# Patient Record
Sex: Female | Born: 1960 | Race: White | Hispanic: No | Marital: Single | Smoking: Current every day smoker
Health system: Southern US, Community
[De-identification: ages and names within clinical notes are randomized; demographics above are authoritative.]

## PROBLEM LIST (undated history)

## (undated) DIAGNOSIS — J45909 Unspecified asthma, uncomplicated: Secondary | ICD-10-CM

## (undated) DIAGNOSIS — I639 Cerebral infarction, unspecified: Secondary | ICD-10-CM

## (undated) DIAGNOSIS — F419 Anxiety disorder, unspecified: Secondary | ICD-10-CM

## (undated) DIAGNOSIS — G252 Other specified forms of tremor: Secondary | ICD-10-CM

## (undated) DIAGNOSIS — E119 Type 2 diabetes mellitus without complications: Secondary | ICD-10-CM

## (undated) DIAGNOSIS — J449 Chronic obstructive pulmonary disease, unspecified: Secondary | ICD-10-CM

## (undated) DIAGNOSIS — I10 Essential (primary) hypertension: Secondary | ICD-10-CM

## (undated) DIAGNOSIS — E785 Hyperlipidemia, unspecified: Secondary | ICD-10-CM

## (undated) DIAGNOSIS — G4700 Insomnia, unspecified: Secondary | ICD-10-CM

## (undated) HISTORY — PX: FOOT SURGERY: SHX648

## (undated) HISTORY — PX: BRONCHOSCOPY: SUR163

## (undated) HISTORY — PX: CERVICAL SPINE SURGERY: SHX589

## (undated) HISTORY — PX: BACK SURGERY: SHX140

## (undated) HISTORY — PX: CHOLECYSTECTOMY: SHX55

---

## 2004-10-01 ENCOUNTER — Emergency Department: Payer: Self-pay | Admitting: Internal Medicine

## 2005-08-31 ENCOUNTER — Emergency Department: Payer: Self-pay | Admitting: Unknown Physician Specialty

## 2006-10-09 ENCOUNTER — Ambulatory Visit: Payer: Self-pay

## 2007-04-12 ENCOUNTER — Emergency Department: Payer: Self-pay | Admitting: Emergency Medicine

## 2007-06-16 ENCOUNTER — Emergency Department (HOSPITAL_COMMUNITY): Admission: EM | Admit: 2007-06-16 | Discharge: 2007-06-17 | Payer: Self-pay | Admitting: Emergency Medicine

## 2008-06-24 ENCOUNTER — Emergency Department: Payer: Self-pay | Admitting: Emergency Medicine

## 2008-08-30 ENCOUNTER — Emergency Department: Payer: Self-pay | Admitting: Unknown Physician Specialty

## 2008-10-21 ENCOUNTER — Emergency Department (HOSPITAL_COMMUNITY): Admission: EM | Admit: 2008-10-21 | Discharge: 2008-10-21 | Payer: Self-pay | Admitting: Emergency Medicine

## 2008-11-02 ENCOUNTER — Ambulatory Visit: Payer: Self-pay | Admitting: Pain Medicine

## 2009-05-27 ENCOUNTER — Ambulatory Visit: Payer: Self-pay | Admitting: Orthopedic Surgery

## 2009-06-07 ENCOUNTER — Ambulatory Visit: Payer: Self-pay | Admitting: Unknown Physician Specialty

## 2009-06-14 ENCOUNTER — Ambulatory Visit: Payer: Self-pay | Admitting: Unknown Physician Specialty

## 2009-11-04 ENCOUNTER — Ambulatory Visit: Payer: Self-pay | Admitting: Unknown Physician Specialty

## 2009-11-21 ENCOUNTER — Ambulatory Visit: Payer: Self-pay | Admitting: Family Medicine

## 2009-11-22 ENCOUNTER — Ambulatory Visit: Payer: Self-pay | Admitting: Unknown Physician Specialty

## 2009-11-23 ENCOUNTER — Emergency Department: Payer: Self-pay | Admitting: Emergency Medicine

## 2009-12-08 ENCOUNTER — Emergency Department: Payer: Self-pay | Admitting: Unknown Physician Specialty

## 2010-01-08 ENCOUNTER — Inpatient Hospital Stay: Payer: Self-pay | Admitting: Unknown Physician Specialty

## 2010-02-14 ENCOUNTER — Emergency Department: Payer: Self-pay | Admitting: Emergency Medicine

## 2010-04-10 ENCOUNTER — Encounter: Payer: Self-pay | Admitting: Unknown Physician Specialty

## 2010-04-26 ENCOUNTER — Emergency Department: Payer: Self-pay | Admitting: Emergency Medicine

## 2010-10-31 ENCOUNTER — Ambulatory Visit: Payer: Self-pay

## 2010-11-14 ENCOUNTER — Ambulatory Visit: Payer: Self-pay | Admitting: Unknown Physician Specialty

## 2011-09-22 ENCOUNTER — Emergency Department: Payer: Self-pay | Admitting: Emergency Medicine

## 2011-11-18 ENCOUNTER — Ambulatory Visit: Payer: Self-pay | Admitting: Internal Medicine

## 2011-11-23 ENCOUNTER — Inpatient Hospital Stay: Payer: Self-pay | Admitting: Specialist

## 2011-11-23 LAB — COMPREHENSIVE METABOLIC PANEL
Albumin: 3.1 g/dL — ABNORMAL LOW (ref 3.4–5.0)
Anion Gap: 13 (ref 7–16)
Anion Gap: 5 — ABNORMAL LOW (ref 7–16)
BUN: 18 mg/dL (ref 7–18)
Calcium, Total: 8.8 mg/dL (ref 8.5–10.1)
Calcium, Total: 8.3 mg/dL — ABNORMAL LOW (ref 8.5–10.1)
Chloride: 97 mmol/L — ABNORMAL LOW (ref 98–107)
Chloride: 110 mmol/L — ABNORMAL HIGH (ref 98–107)
Co2: 31 mmol/L (ref 21–32)
EGFR (African American): 44 — ABNORMAL LOW
EGFR (African American): >60
EGFR (Non-African Amer.): >60
Glucose: 722 mg/dL (ref 65–99)
Osmolality: 306 (ref 275–301)
Potassium: 6.2 mmol/L — ABNORMAL HIGH (ref 3.5–5.1)
Potassium: 5 mmol/L (ref 3.5–5.1)
SGOT(AST): 70 U/L — ABNORMAL HIGH (ref 15–37)
SGOT(AST): 55 U/L — ABNORMAL HIGH (ref 15–37)
SGPT (ALT): 46 U/L (ref 12–78)
Sodium: 135 mmol/L — ABNORMAL LOW (ref 136–145)
Total Protein: 7.5 g/dL (ref 6.4–8.2)
Total Protein: 7.3 g/dL (ref 6.4–8.2)

## 2011-11-23 LAB — CBC
HCT: 40 % (ref 35.0–47.0)
MCHC: 31.2 g/dL — ABNORMAL LOW (ref 32.0–36.0)
MCV: 97 fL (ref 80–100)
RDW: 14.1 % (ref 11.5–14.5)
WBC: 11.1 10*3/uL — ABNORMAL HIGH (ref 3.6–11.0)

## 2011-11-23 LAB — ETHANOL
Ethanol %: <0.003 % (ref 0.000–0.080)
Ethanol: <3 mg/dL

## 2011-11-23 LAB — URINALYSIS, COMPLETE
Glucose,UR: >=500 mg/dL (ref 0–75)
Hyaline Cast: 4
Ketone: NEGATIVE
Specific Gravity: 1.027 (ref 1.003–1.030)
Squamous Epithelial: NONE SEEN
WBC UR: 1 /HPF (ref 0–5)

## 2011-11-23 LAB — PHOSPHORUS: Phosphorus: 10.3 mg/dL — ABNORMAL HIGH (ref 2.5–4.9)

## 2011-11-23 LAB — DRUG SCREEN, URINE
Cannabinoid 50 Ng, Ur ~~LOC~~: NEGATIVE (ref ?–50)
Cocaine Metabolite,Ur ~~LOC~~: NEGATIVE (ref ?–300)
MDMA (Ecstasy)Ur Screen: NEGATIVE (ref ?–500)
Opiate, Ur Screen: NEGATIVE (ref ?–300)
Phencyclidine (PCP) Ur S: NEGATIVE (ref ?–25)
Tricyclic, Ur Screen: NEGATIVE (ref ?–1000)

## 2011-11-23 LAB — AMMONIA: Ammonia, Plasma: 39 mcmol/L — ABNORMAL HIGH (ref 11–32)

## 2011-11-23 LAB — SALICYLATE LEVEL: Salicylates, Serum: 3.5 mg/dL — ABNORMAL HIGH

## 2011-11-23 LAB — PROTIME-INR
INR: 0.9
Prothrombin Time: 13 secs (ref 11.5–14.7)

## 2011-11-23 LAB — CK TOTAL AND CKMB (NOT AT ARMC): CK, Total: 62 U/L (ref 21–215)

## 2011-11-23 LAB — MAGNESIUM: Magnesium: 2.3 mg/dL

## 2011-11-23 LAB — ACETAMINOPHEN LEVEL: Acetaminophen: <2 ug/mL

## 2011-11-23 LAB — TSH: Thyroid Stimulating Horm: 1.86 u[IU]/mL

## 2011-11-24 LAB — URIC ACID: Uric Acid: 5.2 mg/dL (ref 2.6–6.0)

## 2011-11-24 LAB — COMPREHENSIVE METABOLIC PANEL
Albumin: 2.7 g/dL — ABNORMAL LOW (ref 3.4–5.0)
Anion Gap: 3 — ABNORMAL LOW (ref 7–16)
BUN: 18 mg/dL (ref 7–18)
Bilirubin,Total: 0.2 mg/dL (ref 0.2–1.0)
Chloride: 113 mmol/L — ABNORMAL HIGH (ref 98–107)
EGFR (African American): >60
Glucose: 202 mg/dL — ABNORMAL HIGH (ref 65–99)
Osmolality: 300 (ref 275–301)
Potassium: 4.4 mmol/L (ref 3.5–5.1)
SGOT(AST): 46 U/L — ABNORMAL HIGH (ref 15–37)
Sodium: 147 mmol/L — ABNORMAL HIGH (ref 136–145)
Total Protein: 7 g/dL (ref 6.4–8.2)

## 2011-11-24 LAB — CBC WITH DIFFERENTIAL/PLATELET
Basophil #: 0 10*3/uL (ref 0.0–0.1)
Basophil %: 0.3 %
Eosinophil %: 0.3 %
Lymphocyte #: 1.7 10*3/uL (ref 1.0–3.6)
MCH: 31.7 pg (ref 26.0–34.0)
Monocyte #: 1.1 x10 3/mm — ABNORMAL HIGH (ref 0.2–0.9)
Neutrophil %: 78.2 %
Platelet: 199 10*3/uL (ref 150–440)
RDW: 13.9 % (ref 11.5–14.5)

## 2011-11-24 LAB — PHOSPHORUS: Phosphorus: 3.7 mg/dL (ref 2.5–4.9)

## 2011-11-25 LAB — CBC WITH DIFFERENTIAL/PLATELET
Basophil #: 0 10*3/uL (ref 0.0–0.1)
Eosinophil #: 0.3 10*3/uL (ref 0.0–0.7)
Eosinophil %: 2.6 %
HCT: 33.6 % — ABNORMAL LOW (ref 35.0–47.0)
MCH: 30.3 pg (ref 26.0–34.0)
MCHC: 32.9 g/dL (ref 32.0–36.0)
Monocyte #: 0.9 x10 3/mm (ref 0.2–0.9)
Neutrophil %: 66.5 %
Platelet: 208 10*3/uL (ref 150–440)
RDW: 13.9 % (ref 11.5–14.5)
WBC: 10.7 10*3/uL (ref 3.6–11.0)

## 2011-11-25 LAB — MAGNESIUM: Magnesium: 1.7 mg/dL — ABNORMAL LOW

## 2011-11-25 LAB — SODIUM: Sodium: 141 mmol/L (ref 136–145)

## 2011-11-26 LAB — BASIC METABOLIC PANEL
Anion Gap: 8 (ref 7–16)
Chloride: 105 mmol/L (ref 98–107)
Co2: 30 mmol/L (ref 21–32)
Creatinine: 0.51 mg/dL — ABNORMAL LOW (ref 0.60–1.30)

## 2011-11-26 LAB — CBC WITH DIFFERENTIAL/PLATELET
Basophil #: 0 10*3/uL (ref 0.0–0.1)
HCT: 32.1 % — ABNORMAL LOW (ref 35.0–47.0)
HGB: 11 g/dL — ABNORMAL LOW (ref 12.0–16.0)
Lymphocyte #: 1.9 10*3/uL (ref 1.0–3.6)
MCH: 31 pg (ref 26.0–34.0)
MCHC: 34.1 g/dL (ref 32.0–36.0)
Monocyte #: 0.7 x10 3/mm (ref 0.2–0.9)
Neutrophil #: 7.8 10*3/uL — ABNORMAL HIGH (ref 1.4–6.5)
Platelet: 211 10*3/uL (ref 150–440)
RDW: 13.4 % (ref 11.5–14.5)

## 2011-11-28 LAB — VANCOMYCIN, TROUGH
Vancomycin, Trough: 31 ug/mL (ref 10–20)
Vancomycin, Trough: 24 ug/mL (ref 10–20)

## 2011-11-28 LAB — CREATININE, SERUM
Creatinine: 1.08 mg/dL (ref 0.60–1.30)
EGFR (African American): >60
EGFR (African American): 52 — ABNORMAL LOW
EGFR (Non-African Amer.): 45 — ABNORMAL LOW

## 2011-11-29 LAB — CBC WITH DIFFERENTIAL/PLATELET
Basophil #: 0.1 10*3/uL (ref 0.0–0.1)
Basophil %: 0.7 %
Eosinophil #: 0 10*3/uL (ref 0.0–0.7)
Eosinophil %: 0.1 %
HCT: 29.5 % — ABNORMAL LOW (ref 35.0–47.0)
HGB: 9.4 g/dL — ABNORMAL LOW (ref 12.0–16.0)
Lymphocyte #: 1.8 10*3/uL (ref 1.0–3.6)
Lymphocyte %: 14.2 %
MCH: 29.2 pg (ref 26.0–34.0)
MCHC: 31.9 g/dL — ABNORMAL LOW (ref 32.0–36.0)
MCV: 92 fL (ref 80–100)
Monocyte #: 0.9 x10 3/mm (ref 0.2–0.9)
Monocyte %: 7.2 %
Neutrophil #: 10 10*3/uL — ABNORMAL HIGH (ref 1.4–6.5)
Neutrophil %: 77.8 %
Platelet: 229 10*3/uL (ref 150–440)
RBC: 3.23 10*6/uL — ABNORMAL LOW (ref 3.80–5.20)
RDW: 13.6 % (ref 11.5–14.5)
WBC: 12.9 10*3/uL — ABNORMAL HIGH (ref 3.6–11.0)

## 2011-11-29 LAB — BASIC METABOLIC PANEL
Creatinine: 1.85 mg/dL — ABNORMAL HIGH (ref 0.60–1.30)
EGFR (African American): 36 — ABNORMAL LOW
EGFR (Non-African Amer.): 31 — ABNORMAL LOW
Glucose: 433 mg/dL — ABNORMAL HIGH (ref 65–99)
Sodium: 137 mmol/L (ref 136–145)

## 2011-11-29 LAB — WOUND CULTURE

## 2011-11-29 LAB — HEMOGLOBIN: HGB: 9.6 g/dL — ABNORMAL LOW (ref 12.0–16.0)

## 2011-11-30 LAB — CBC WITH DIFFERENTIAL/PLATELET
Basophil %: 0.1 %
Eosinophil %: 0 %
HGB: 9.3 g/dL — ABNORMAL LOW (ref 12.0–16.0)
Lymphocyte %: 9.4 %
MCH: 29.3 pg (ref 26.0–34.0)
Monocyte #: 1 x10 3/mm — ABNORMAL HIGH (ref 0.2–0.9)
Monocyte %: 7.8 %
Neutrophil %: 82.7 %
Platelet: 247 10*3/uL (ref 150–440)
RBC: 3.18 10*6/uL — ABNORMAL LOW (ref 3.80–5.20)
WBC: 12.9 10*3/uL — ABNORMAL HIGH (ref 3.6–11.0)

## 2011-11-30 LAB — BASIC METABOLIC PANEL
Anion Gap: 9 (ref 7–16)
BUN: 36 mg/dL — ABNORMAL HIGH (ref 7–18)
Calcium, Total: 9 mg/dL (ref 8.5–10.1)
EGFR (African American): 37 — ABNORMAL LOW
EGFR (Non-African Amer.): 32 — ABNORMAL LOW
Glucose: 457 mg/dL — ABNORMAL HIGH (ref 65–99)
Osmolality: 302 (ref 275–301)

## 2011-11-30 LAB — GLUCOSE, RANDOM: Glucose: 438 mg/dL — ABNORMAL HIGH (ref 65–99)

## 2011-12-01 LAB — GLUCOSE, RANDOM: Glucose: 421 mg/dL — ABNORMAL HIGH (ref 65–99)

## 2011-12-01 LAB — VANCOMYCIN, TROUGH: Vancomycin, Trough: 16 ug/mL (ref 10–20)

## 2011-12-02 LAB — CBC WITH DIFFERENTIAL/PLATELET
Basophil %: 0.4 %
Eosinophil #: 0.1 10*3/uL (ref 0.0–0.7)
HCT: 29.7 % — ABNORMAL LOW (ref 35.0–47.0)
HGB: 10 g/dL — ABNORMAL LOW (ref 12.0–16.0)
MCH: 30.7 pg (ref 26.0–34.0)
MCHC: 33.7 g/dL (ref 32.0–36.0)
Monocyte #: 1.1 x10 3/mm — ABNORMAL HIGH (ref 0.2–0.9)
Neutrophil #: 9.9 10*3/uL — ABNORMAL HIGH (ref 1.4–6.5)
Neutrophil %: 72.6 %

## 2011-12-02 LAB — BASIC METABOLIC PANEL
Anion Gap: 10 (ref 7–16)
BUN: 30 mg/dL — ABNORMAL HIGH (ref 7–18)
Creatinine: 1.44 mg/dL — ABNORMAL HIGH (ref 0.60–1.30)
EGFR (African American): 48 — ABNORMAL LOW
EGFR (Non-African Amer.): 42 — ABNORMAL LOW

## 2011-12-02 LAB — WOUND CULTURE

## 2012-01-03 ENCOUNTER — Emergency Department: Payer: Self-pay | Admitting: Emergency Medicine

## 2012-01-03 LAB — CBC
MCH: 30.4 pg (ref 26.0–34.0)
MCHC: 34 g/dL (ref 32.0–36.0)
Platelet: 222 10*3/uL (ref 150–440)
RDW: 14.8 % — ABNORMAL HIGH (ref 11.5–14.5)

## 2012-01-03 LAB — COMPREHENSIVE METABOLIC PANEL
Albumin: 3.6 g/dL (ref 3.4–5.0)
BUN: 18 mg/dL (ref 7–18)
Bilirubin,Total: 0.4 mg/dL (ref 0.2–1.0)
Calcium, Total: 9.1 mg/dL (ref 8.5–10.1)
Co2: 25 mmol/L (ref 21–32)
Creatinine: 0.99 mg/dL (ref 0.60–1.30)
EGFR (African American): >60
EGFR (Non-African Amer.): >60
Glucose: 240 mg/dL — ABNORMAL HIGH (ref 65–99)
Osmolality: 287 (ref 275–301)
Potassium: 3.9 mmol/L (ref 3.5–5.1)
SGOT(AST): 16 U/L (ref 15–37)
SGPT (ALT): 16 U/L (ref 12–78)
Sodium: 139 mmol/L (ref 136–145)

## 2012-03-12 ENCOUNTER — Ambulatory Visit: Payer: Self-pay | Admitting: Internal Medicine

## 2012-05-01 ENCOUNTER — Emergency Department: Payer: Self-pay | Admitting: Emergency Medicine

## 2012-05-01 LAB — COMPREHENSIVE METABOLIC PANEL
Albumin: 3.5 g/dL (ref 3.4–5.0)
Alkaline Phosphatase: 126 U/L (ref 50–136)
Anion Gap: 11 (ref 7–16)
Bilirubin,Total: 0.4 mg/dL (ref 0.2–1.0)
Calcium, Total: 9 mg/dL (ref 8.5–10.1)
EGFR (African American): >60
EGFR (Non-African Amer.): >60
Glucose: 488 mg/dL — ABNORMAL HIGH (ref 65–99)
Osmolality: 283 (ref 275–301)
Potassium: 4.5 mmol/L (ref 3.5–5.1)
SGOT(AST): 15 U/L (ref 15–37)
SGPT (ALT): 19 U/L (ref 12–78)
Total Protein: 7.8 g/dL (ref 6.4–8.2)

## 2012-05-01 LAB — CBC
HGB: 15.9 g/dL (ref 12.0–16.0)
MCH: 30.3 pg (ref 26.0–34.0)
MCV: 90 fL (ref 80–100)
RBC: 5.24 10*6/uL — ABNORMAL HIGH (ref 3.80–5.20)
WBC: 15.3 10*3/uL — ABNORMAL HIGH (ref 3.6–11.0)

## 2012-07-21 ENCOUNTER — Ambulatory Visit: Payer: Self-pay | Admitting: Internal Medicine

## 2012-07-30 ENCOUNTER — Observation Stay: Payer: Self-pay | Admitting: Internal Medicine

## 2012-07-30 LAB — BASIC METABOLIC PANEL
Calcium, Total: 8.9 mg/dL (ref 8.5–10.1)
Creatinine: 1.04 mg/dL (ref 0.60–1.30)
EGFR (African American): >60
Glucose: 200 mg/dL — ABNORMAL HIGH (ref 65–99)
Osmolality: 288 (ref 275–301)
Potassium: 3.9 mmol/L (ref 3.5–5.1)
Sodium: 138 mmol/L (ref 136–145)

## 2012-07-30 LAB — CBC
HCT: 41.8 % (ref 35.0–47.0)
HGB: 14.1 g/dL (ref 12.0–16.0)
RBC: 4.58 10*6/uL (ref 3.80–5.20)
RDW: 14.8 % — ABNORMAL HIGH (ref 11.5–14.5)
WBC: 11.8 10*3/uL — ABNORMAL HIGH (ref 3.6–11.0)

## 2012-07-30 LAB — TROPONIN I: Troponin-I: <0.02 ng/mL

## 2012-07-31 LAB — URINALYSIS, COMPLETE
Blood: NEGATIVE
Ketone: NEGATIVE
Nitrite: NEGATIVE
Specific Gravity: 1.021 (ref 1.003–1.030)
WBC UR: <1 /HPF (ref 0–5)

## 2012-07-31 LAB — CBC WITH DIFFERENTIAL/PLATELET
Basophil %: 0.6 %
Eosinophil #: 0 10*3/uL (ref 0.0–0.7)
Eosinophil %: 0.1 %
HCT: 42.4 % (ref 35.0–47.0)
HGB: 14 g/dL (ref 12.0–16.0)
Lymphocyte #: 1.5 10*3/uL (ref 1.0–3.6)
Lymphocyte %: 14.3 %
MCH: 30.4 pg (ref 26.0–34.0)
MCHC: 32.9 g/dL (ref 32.0–36.0)
MCV: 93 fL (ref 80–100)
Monocyte #: 0.1 x10 3/mm — ABNORMAL LOW (ref 0.2–0.9)
Monocyte %: 0.9 %
Neutrophil %: 84.1 %
RDW: 14.7 % — ABNORMAL HIGH (ref 11.5–14.5)

## 2012-07-31 LAB — BASIC METABOLIC PANEL
Anion Gap: 7 (ref 7–16)
BUN: 28 mg/dL — ABNORMAL HIGH (ref 7–18)
Chloride: 105 mmol/L (ref 98–107)
Creatinine: 1.01 mg/dL (ref 0.60–1.30)
EGFR (Non-African Amer.): >60

## 2012-07-31 LAB — CK TOTAL AND CKMB (NOT AT ARMC): CK-MB: 0.8 ng/mL (ref 0.5–3.6)

## 2012-09-08 ENCOUNTER — Ambulatory Visit: Payer: Self-pay | Admitting: Internal Medicine

## 2013-05-21 ENCOUNTER — Emergency Department: Payer: Self-pay | Admitting: Internal Medicine

## 2013-05-21 LAB — BASIC METABOLIC PANEL
ANION GAP: 6 — AB (ref 7–16)
BUN: 12 mg/dL (ref 7–18)
CO2: 29 mmol/L (ref 21–32)
CREATININE: 0.8 mg/dL (ref 0.60–1.30)
Calcium, Total: 9 mg/dL (ref 8.5–10.1)
Chloride: 104 mmol/L (ref 98–107)
EGFR (Non-African Amer.): >60
Glucose: 234 mg/dL — ABNORMAL HIGH (ref 65–99)
Osmolality: 285 (ref 275–301)
POTASSIUM: 4 mmol/L (ref 3.5–5.1)
Sodium: 139 mmol/L (ref 136–145)

## 2013-05-21 LAB — CBC WITH DIFFERENTIAL/PLATELET
BASOS ABS: 0.1 10*3/uL (ref 0.0–0.1)
BASOS PCT: 0.9 %
EOS ABS: 0.3 10*3/uL (ref 0.0–0.7)
EOS PCT: 2.6 %
HCT: 44.5 % (ref 35.0–47.0)
HGB: 14.2 g/dL (ref 12.0–16.0)
Lymphocyte #: 2.3 10*3/uL (ref 1.0–3.6)
Lymphocyte %: 19.3 %
MCH: 29.3 pg (ref 26.0–34.0)
MCHC: 32 g/dL (ref 32.0–36.0)
MCV: 92 fL (ref 80–100)
MONO ABS: 0.5 x10 3/mm (ref 0.2–0.9)
Monocyte %: 4.6 %
NEUTROS ABS: 8.6 10*3/uL — AB (ref 1.4–6.5)
NEUTROS PCT: 72.6 %
PLATELETS: 190 10*3/uL (ref 150–440)
RBC: 4.85 10*6/uL (ref 3.80–5.20)
RDW: 14.1 % (ref 11.5–14.5)
WBC: 11.8 10*3/uL — ABNORMAL HIGH (ref 3.6–11.0)

## 2013-05-21 LAB — TROPONIN I

## 2013-09-07 ENCOUNTER — Ambulatory Visit: Payer: Self-pay | Admitting: Internal Medicine

## 2013-09-10 ENCOUNTER — Inpatient Hospital Stay: Payer: Self-pay | Admitting: Internal Medicine

## 2013-09-10 LAB — CBC
HCT: 44.5 % (ref 35.0–47.0)
HGB: 14.4 g/dL (ref 12.0–16.0)
MCH: 29.9 pg (ref 26.0–34.0)
MCHC: 32.5 g/dL (ref 32.0–36.0)
MCV: 92 fL (ref 80–100)
Platelet: 223 10*3/uL (ref 150–440)
RBC: 4.83 10*6/uL (ref 3.80–5.20)
RDW: 14.3 % (ref 11.5–14.5)
WBC: 9.7 10*3/uL (ref 3.6–11.0)

## 2013-09-10 LAB — BASIC METABOLIC PANEL
Anion Gap: 10 (ref 7–16)
BUN: 19 mg/dL — ABNORMAL HIGH (ref 7–18)
CO2: 26 mmol/L (ref 21–32)
CREATININE: 1.2 mg/dL (ref 0.60–1.30)
Calcium, Total: 8.6 mg/dL (ref 8.5–10.1)
Chloride: 97 mmol/L — ABNORMAL LOW (ref 98–107)
EGFR (African American): 60 — ABNORMAL LOW
EGFR (Non-African Amer.): 52 — ABNORMAL LOW
GLUCOSE: 378 mg/dL — AB (ref 65–99)
Osmolality: 284 (ref 275–301)
Potassium: 4.4 mmol/L (ref 3.5–5.1)
SODIUM: 133 mmol/L — AB (ref 136–145)

## 2013-09-10 LAB — TROPONIN I: Troponin-I: <0.02 ng/mL

## 2013-09-11 LAB — BASIC METABOLIC PANEL
Anion Gap: 10 (ref 7–16)
BUN: 32 mg/dL — ABNORMAL HIGH (ref 7–18)
CHLORIDE: 95 mmol/L — AB (ref 98–107)
CO2: 27 mmol/L (ref 21–32)
CREATININE: 1.53 mg/dL — AB (ref 0.60–1.30)
Calcium, Total: 8.3 mg/dL — ABNORMAL LOW (ref 8.5–10.1)
GFR CALC AF AMER: 45 — AB
GFR CALC NON AF AMER: 38 — AB
Glucose: 695 mg/dL (ref 65–99)
Osmolality: 305 (ref 275–301)
Potassium: 4.5 mmol/L (ref 3.5–5.1)
Sodium: 132 mmol/L — ABNORMAL LOW (ref 136–145)

## 2013-09-11 LAB — CBC WITH DIFFERENTIAL/PLATELET
BASOS PCT: 0.5 %
Basophil #: 0 10*3/uL (ref 0.0–0.1)
EOS PCT: 0.1 %
Eosinophil #: 0 10*3/uL (ref 0.0–0.7)
HCT: 41.4 % (ref 35.0–47.0)
HGB: 13.5 g/dL (ref 12.0–16.0)
Lymphocyte #: 1.1 10*3/uL (ref 1.0–3.6)
Lymphocyte %: 10.8 %
MCH: 30.7 pg (ref 26.0–34.0)
MCHC: 32.5 g/dL (ref 32.0–36.0)
MCV: 94 fL (ref 80–100)
MONO ABS: 0.2 x10 3/mm (ref 0.2–0.9)
Monocyte %: 1.9 %
Neutrophil #: 8.6 10*3/uL — ABNORMAL HIGH (ref 1.4–6.5)
Neutrophil %: 86.7 %
PLATELETS: 186 10*3/uL (ref 150–440)
RBC: 4.38 10*6/uL (ref 3.80–5.20)
RDW: 14.3 % (ref 11.5–14.5)
WBC: 9.9 10*3/uL (ref 3.6–11.0)

## 2013-09-11 LAB — TSH: THYROID STIMULATING HORM: 0.283 u[IU]/mL — AB

## 2013-09-11 LAB — HEMOGLOBIN A1C: Hemoglobin A1C: 11.5 % — ABNORMAL HIGH (ref 4.2–6.3)

## 2013-09-11 LAB — MAGNESIUM: MAGNESIUM: 1.5 mg/dL — AB

## 2013-09-12 LAB — CREATININE, SERUM
CREATININE: 0.93 mg/dL (ref 0.60–1.30)
EGFR (Non-African Amer.): >60

## 2013-09-23 ENCOUNTER — Inpatient Hospital Stay: Payer: Self-pay | Admitting: Internal Medicine

## 2013-09-23 LAB — BASIC METABOLIC PANEL
ANION GAP: 9 (ref 7–16)
BUN: 17 mg/dL (ref 7–18)
CALCIUM: 9.5 mg/dL (ref 8.5–10.1)
CHLORIDE: 92 mmol/L — AB (ref 98–107)
CO2: 28 mmol/L (ref 21–32)
CREATININE: 1.07 mg/dL (ref 0.60–1.30)
GFR CALC NON AF AMER: 59 — AB
GLUCOSE: 555 mg/dL — AB (ref 65–99)
OSMOLALITY: 286 (ref 275–301)
POTASSIUM: 4.3 mmol/L (ref 3.5–5.1)
Sodium: 129 mmol/L — ABNORMAL LOW (ref 136–145)

## 2013-09-23 LAB — CBC
HCT: 42.2 % (ref 35.0–47.0)
HGB: 14.1 g/dL (ref 12.0–16.0)
MCH: 31 pg (ref 26.0–34.0)
MCHC: 33.4 g/dL (ref 32.0–36.0)
MCV: 93 fL (ref 80–100)
Platelet: 165 10*3/uL (ref 150–440)
RBC: 4.55 10*6/uL (ref 3.80–5.20)
RDW: 14.5 % (ref 11.5–14.5)
WBC: 10.5 10*3/uL (ref 3.6–11.0)

## 2013-09-23 LAB — TROPONIN I

## 2013-09-25 LAB — BASIC METABOLIC PANEL
Anion Gap: 10 (ref 7–16)
BUN: 26 mg/dL — ABNORMAL HIGH (ref 7–18)
CO2: 28 mmol/L (ref 21–32)
CREATININE: 0.96 mg/dL (ref 0.60–1.30)
Calcium, Total: 8.7 mg/dL (ref 8.5–10.1)
Chloride: 99 mmol/L (ref 98–107)
EGFR (African American): >60
EGFR (Non-African Amer.): >60
GLUCOSE: 378 mg/dL — AB (ref 65–99)
OSMOLALITY: 294 (ref 275–301)
POTASSIUM: 4 mmol/L (ref 3.5–5.1)
Sodium: 137 mmol/L (ref 136–145)

## 2013-09-25 LAB — CBC WITH DIFFERENTIAL/PLATELET
BASOS PCT: 0.1 %
Basophil #: 0 10*3/uL (ref 0.0–0.1)
EOS ABS: 0 10*3/uL (ref 0.0–0.7)
EOS PCT: 0 %
HCT: 37.9 % (ref 35.0–47.0)
HGB: 12.6 g/dL (ref 12.0–16.0)
LYMPHS ABS: 1 10*3/uL (ref 1.0–3.6)
Lymphocyte %: 10.8 %
MCH: 30.5 pg (ref 26.0–34.0)
MCHC: 33.3 g/dL (ref 32.0–36.0)
MCV: 92 fL (ref 80–100)
MONO ABS: 0.2 x10 3/mm (ref 0.2–0.9)
Monocyte %: 2.5 %
NEUTROS ABS: 8.2 10*3/uL — AB (ref 1.4–6.5)
Neutrophil %: 86.6 %
PLATELETS: 177 10*3/uL (ref 150–440)
RBC: 4.14 10*6/uL (ref 3.80–5.20)
RDW: 14.7 % — ABNORMAL HIGH (ref 11.5–14.5)
WBC: 9.5 10*3/uL (ref 3.6–11.0)

## 2013-10-03 ENCOUNTER — Ambulatory Visit: Payer: Self-pay

## 2013-10-04 ENCOUNTER — Ambulatory Visit: Payer: Self-pay

## 2013-11-03 ENCOUNTER — Ambulatory Visit: Payer: Self-pay

## 2013-12-07 IMAGING — CR DG FOOT COMPLETE 3+V*L*
1 series · 3 of 3 positions shown · non-contrast
Comparison: none

REASON FOR EXAM: sweeling and discoloration to base of left great toe,
?trauma
COMMENTS:   Bedside (portable):Y

PROCEDURE:     DXR - DXR FOOT LT COMP W/OBLIQUES  - November 23, 2011  [DATE]
RESULT:     There is no evidence of fracture, dislocation, or malalignment.

[Series 1: ap · 0.17mm/px · 3 of 3 slices shown]
[im 1/3]
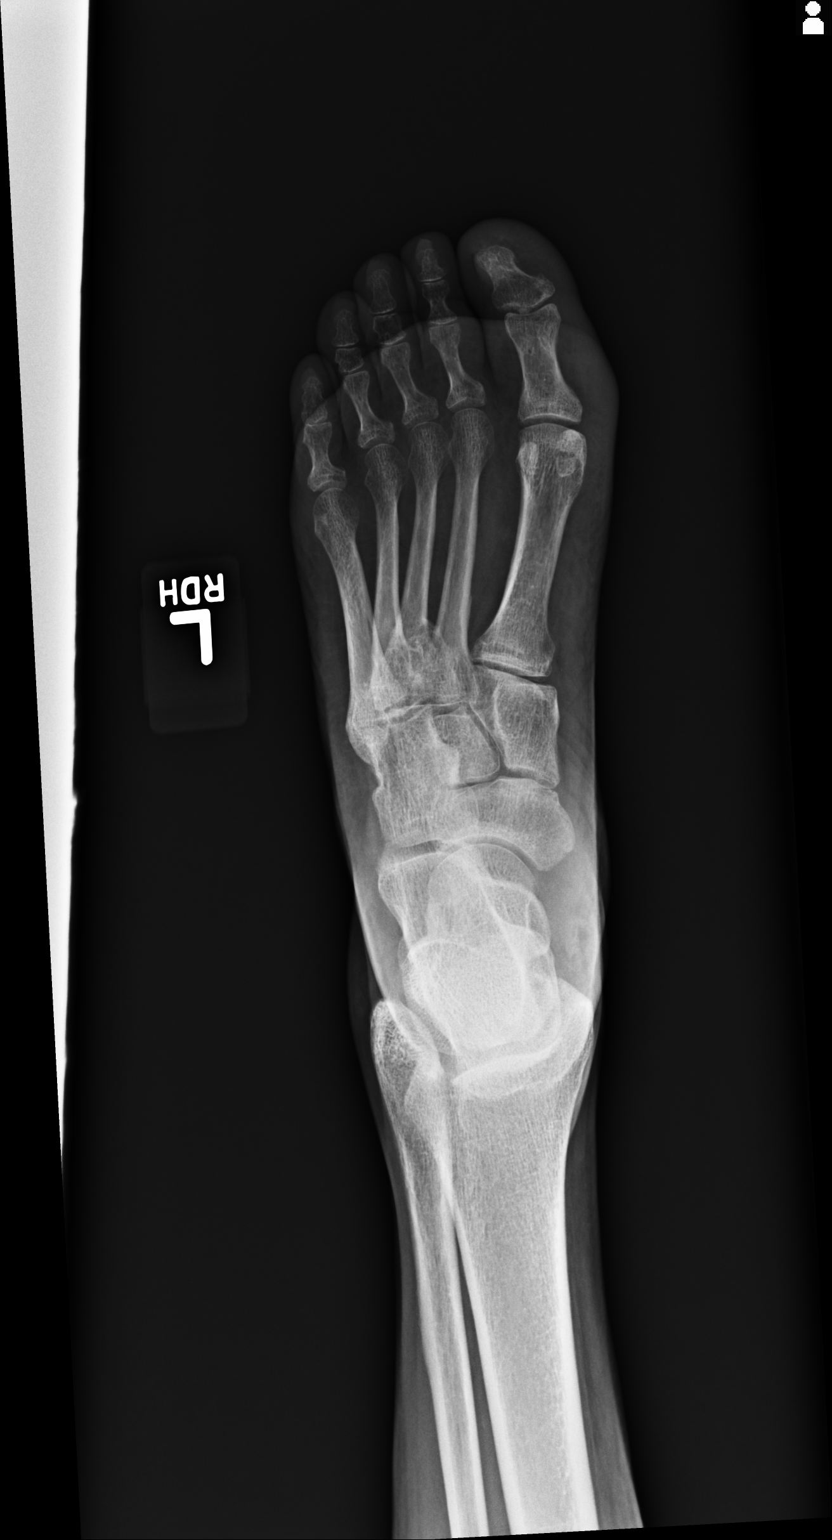
[im 2/3]
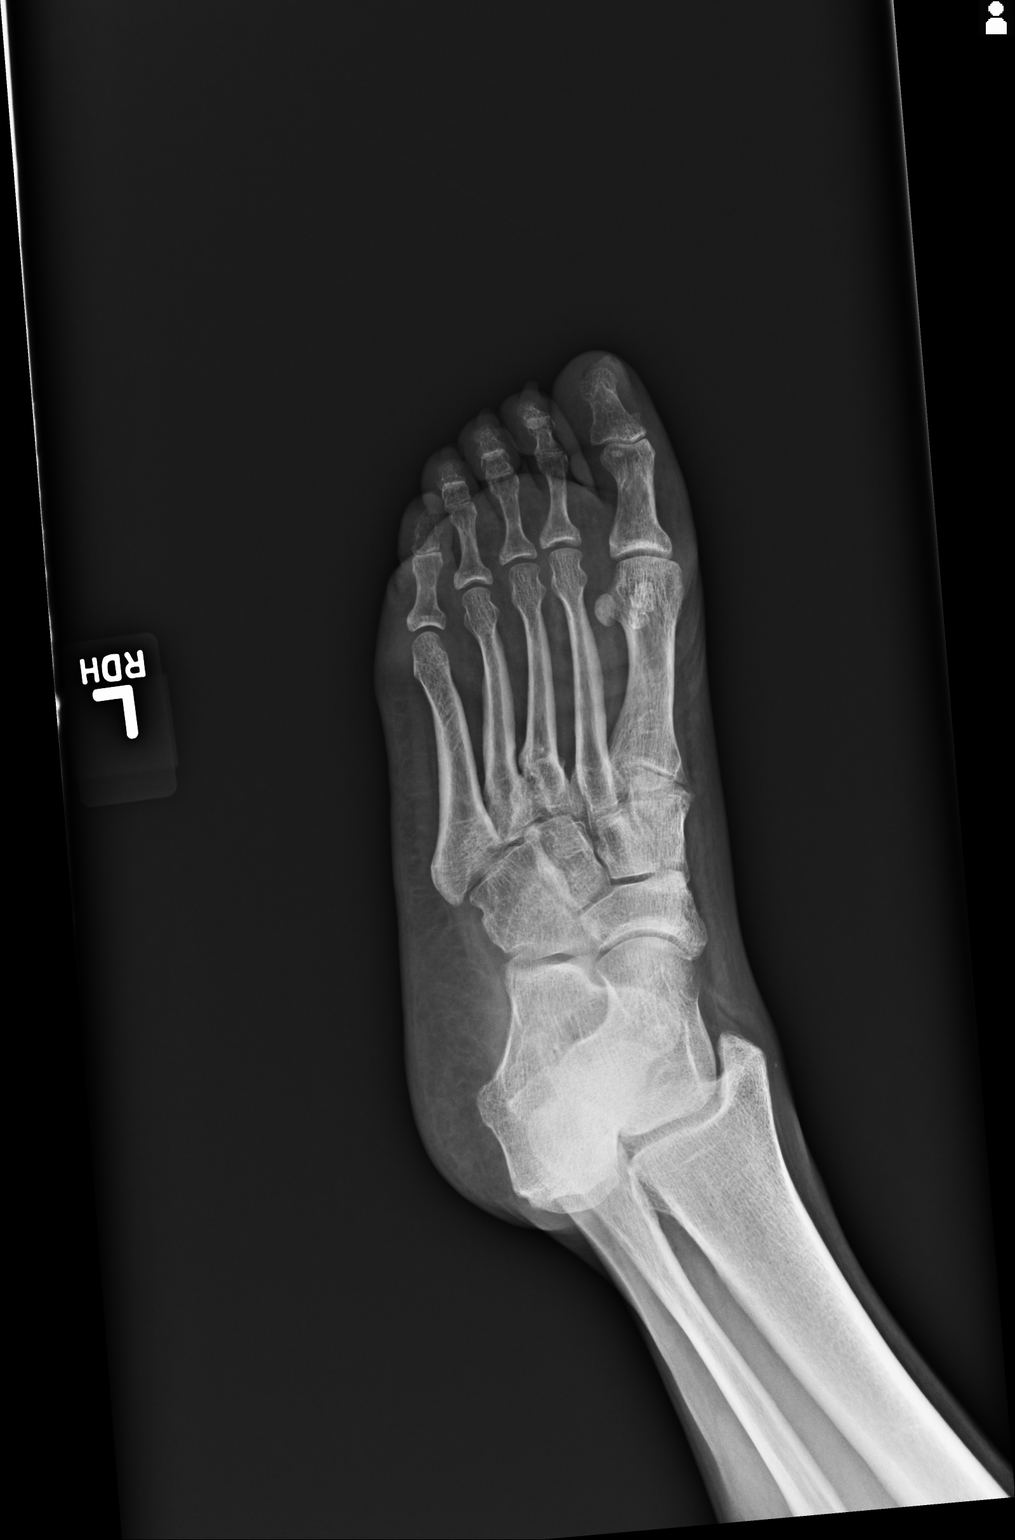
[im 3/3]
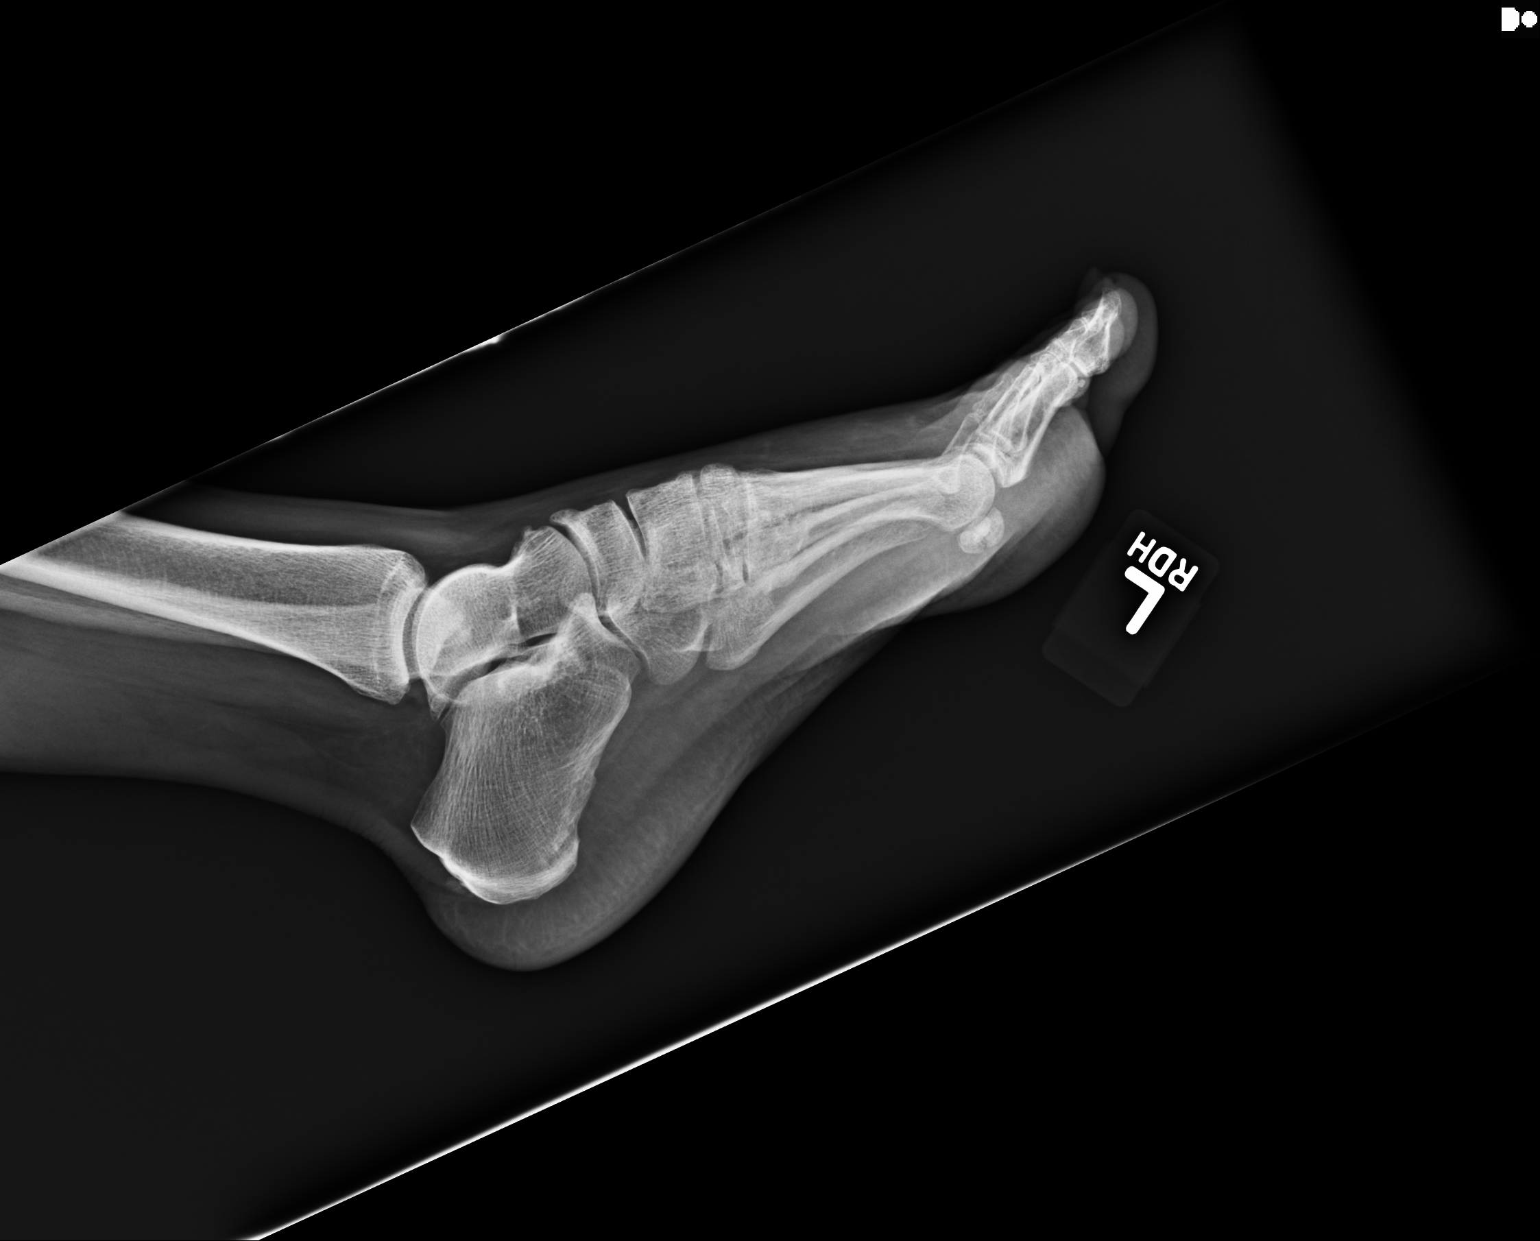

[3 of 3 positions shown; findings below may reference images not displayed]

IMPRESSION: 1. No evidence of acute abnormalities.
2. If there are persistent complaints of pain or persistent clinical
concern, a repeat evaluation in 7-10 days is recommended if clinically
warranted.

## 2014-02-27 ENCOUNTER — Emergency Department: Payer: Self-pay | Admitting: Emergency Medicine

## 2014-02-27 LAB — URINALYSIS, COMPLETE
BACTERIA: NONE SEEN
Bilirubin,UR: NEGATIVE
Glucose,UR: >=500 mg/dL (ref 0–75)
KETONE: NEGATIVE
LEUKOCYTE ESTERASE: NEGATIVE
Nitrite: NEGATIVE
Ph: 6 (ref 4.5–8.0)
Protein: 100
SPECIFIC GRAVITY: 1.023 (ref 1.003–1.030)
Squamous Epithelial: 4
WBC UR: 2 /HPF (ref 0–5)

## 2014-03-25 ENCOUNTER — Emergency Department: Payer: Self-pay | Admitting: Emergency Medicine

## 2014-04-21 ENCOUNTER — Emergency Department: Payer: Self-pay | Admitting: Emergency Medicine

## 2014-05-23 NOTE — Op Note (Signed)
PATIENT NAME:  Teresa LeydenRILEY, [name] MR#:  [id number] DATE OF BIRTH:  10/26/1960  DATE OF PROCEDURE:  12/02/2011  PREOPERATIVE DIAGNOSIS: Left foot abscess, forefoot region.   POSTOPERATIVE DIAGNOSIS: Left foot abscess, forefoot region.   PROCEDURE: Incision and drainage left foot abscess.   SURGEON: Laken Lobato A. Ether GriffinsFowler, DPM   ANESTHESIA: IV anesthesia with local.   HEMOSTASIS: None.   OPERATIVE INDICATIONS: This is a 54 year old female seen in the hospital. She has had a draining abscess. She has undergone a bedside and intraoperative I and D and continues to have drainage. We brought her back to the OR for further incision and drainage. The risks, benefits, alternatives, and complications associated with surgery were discussed with the patient and informed consent was given.   OPERATIVE PROCEDURE: The patient was brought into the Operating Room and placed on the operating table in supine position. IV sedation was administered by the Anesthesia team. The left lower extremity was prepped and draped in the usual sterile fashion. Attention was directed to the medial aspect of the left first metatarsophalangeal joint where the previous incision was noted. This was opened up. There was noted to be purulent drainage from the plantar aspect of the first metatarsal plantar joint. A second incision was made between the first and second metatarsal heads. There was a little bit of drainage from underneath the first web space. I then drained all of this out, and all areas were debrided with a VersaJet and flushed with copious amounts of irrigation. All bleeders were Bovie cauterized. This was packed with Surgicel packing as well as iodoform packing superficially. The skin was closed superficially medially with an area left open approximately 2 cm, and the dorsal incision was closed completely in the first web space. A well compressive sterile bulky dressing was placed on the left foot.     She will go back to the  floor. She will continue with antibiotics.  I will see her tomorrow. We may have to put a wound VAC on.   ____________________________ Argentina DonovanJustin A. Ether GriffinsFowler, DPM jaf:cbb D: 12/02/2011 14:19:10 ET T: 12/02/2011 15:11:28 ET JOB#: [id number]  cc: Jill AlexandersJustin A. Ether GriffinsFowler, DPM, <Dictator>  Robyn Galati DPM ELECTRONICALLY SIGNED 12/05/2011 9:57

## 2014-05-23 NOTE — H&P (Signed)
PATIENT NAME:  [name], [name] MR#:  [id number] DATE OF BIRTH:  [birth year]  DATE OF ADMISSION:  11/23/2011  REFERRING PHYSICIAN: Si Raider, MD  FAMILY PHYSICIAN: Dr. Sande Brothers at [facility]  REASON FOR ADMISSION: Diabetic ketoacidosis with altered mental status.   HISTORY OF PRESENT ILLNESS: The patient is a 54 year old female with a history of type 1 diabetes, anxiety/depression, migraine headaches, and chronic pain. She has been having difficulty with gout recently. She was fine earlier today. She was found at home after lunch lethargic and unresponsive. She was brought to the Emergency Room where she was found to have a blood sugar greater than 700. She was noted to be acidotic. Drug screen was negative. She was started on IV fluids with IV insulin and is now admitted for further evaluation.   PAST MEDICAL HISTORY:  1. Insulin-dependent diabetes mellitus.  2. Migraine headaches.  3. Chronic insomnia.  4. Chronic back pain.  5. Gout.  6. Benign hypertension.  7. Hyperlipidemia.  8. Anxiety/depression.  9. Status post C-spine surgery.  10. Status post cholecystectomy.   MEDICATIONS:  1. Ambien 10 mg p.o. at bedtime.  2. Ultram 50 mg p.o. every four hours p.r.n. pain.  3. Metformin 500 mg p.o. twice a day. 4. Lantus 60 units subcutaneous twice a day. 5. NovoLog 12 units subcutaneous before meals.  6. Lamictal 100 mg p.o. daily.  7. Indocin 25 to 50 mg p.o. three times daily p.r.n. pain.  8. Prozac 40 mg p.o. daily.  9. Pepcid 40 mg p.o. twice a day. 10. Vasotec, dose unknown.  11. Clonazepam 1 mg p.o. four times daily. 12. Norco 5/325 mg 1 to 2 p.o. every six hours p.r.n. pain.   ALLERGIES: Aspirin and ibuprofen.   SOCIAL HISTORY: The patient lives with her daughter. She does smoke. No history of alcohol abuse.   FAMILY HISTORY: Positive for diabetes, hypertension, and coronary artery disease.   REVIEW OF SYSTEMS: Unable to obtain from the patient due to her lethargy.    PHYSICAL EXAMINATION:   GENERAL: The patient is acutely ill-appearing, in moderate distress.   VITAL SIGNS: Vital signs are currently remarkable for a blood pressure of 117/53 with a heart rate of 101 and a respiratory rate of 18.   HEENT: Normocephalic, atraumatic. Pupils are equally round and reactive to light and accommodation. Extraocular movements are intact. Sclerae are anicteric. Conjunctivae are clear. Oropharynx is dry but clear.   NECK: Supple without jugular venous distention. No adenopathy or thyromegaly is noted.   LUNGS: Scattered rhonchi without wheezes or rales. No dullness.   CARDIAC: Rapid rate with a regular rhythm. Normal S1 and S2. There is a 2/6 systolic murmur noted throughout the precordium. No rubs or gallops are present.   ABDOMEN: Soft and nontender with normoactive bowel sounds. No organomegaly or masses were appreciated. No hernias or bruits were noted.   EXTREMITIES: Swelling of the left great toe with erythema and tenderness. Distal pulses were 2+ bilaterally.   SKIN: Warm and dry without rash or lesions.   NEUROLOGIC: Cranial nerves II through XII grossly intact. Deep tendon reflexes were symmetric. Motor and sensory exam is nonfocal.   PSYCHIATRIC: Exam revealed a patient who was lethargic and would respond to painful stimuli.  LABORATORY AND DIAGNOSTIC: ABG revealed a pH of 7.20.   Salicylate level was 3.5. Urine drug screen was negative. Alcohol was less than 3. Troponin was 0.03. TSH 1.86. Phosphorus 10.3. Magnesium 2.3. Glucose was 722 with a BUN of 17  and a creatinine of 1.56 with a sodium of 135 and a potassium of 6.2 with a GFR of 38. White count was 11.1 with a hemoglobin of 12.5.   ASSESSMENT:  1. Diabetic ketoacidosis.  2. Metabolic encephalopathy with altered mental status.  3. Hyperkalemia.  4. Dehydration.  5. Renal insufficiency.  6. Hyponatremia.  7. Gout.   PLAN: The patient will be admitted to the Intensive Care Unit with IV  fluids, on an insulin drip. We will follow her sugars hourly and adjust the drip as needed. We will begin allopurinol and colchicine for her gout. Clear liquid diet for now. We will obtain uric acid level and an A1c in the morning. We will follow up her acidosis with an ABG on room air in the morning. We will hold any sedating medications at this time. We will need to convert back to Lantus and NovoLog            once she comes off the drip. Further treatment and evaluation will depend upon the patient's progress.  TOTAL TIME SPENT: 50 minutes.  ____________________________ Duane LopeJeffrey D. Judithann SheenSparks, MD jds:slb D: 11/23/2011 18:50:06 ET T: 11/24/2011 07:33:06 ET JOB#: [id number]  cc: Duane LopeJeffrey D. Judithann SheenSparks, MD, <Dictator> [facility] JEFFREY Rodena Medin SPARKS MD ELECTRONICALLY SIGNED 11/24/2011 8:55

## 2014-05-23 NOTE — Op Note (Signed)
PATIENT NAME:  Teresa LeydenRILEY, Teresa V MR#:  [id number] DATE OF BIRTH:  [birth year]  DATE OF PROCEDURE:  11/28/2011  PREOPERATIVE DIAGNOSIS: Left foot abscess.   POSTOPERATIVE DIAGNOSIS: Left foot abscess.   PROCEDURE: I and D abscess, left medial foot and intermetatarsal space.   SURGEON: Tierra Divelbiss A. Ether GriffinsFowler, DPM   ANESTHESIA: General.   COMPLICATIONS: None.   SPECIMEN: Deep space culture left foot abscess.   HEMOSTASIS: Ankle tourniquet inflated to 225 mmHg for 20 minutes.   OPERATIVE INDICATIONS: This is a 54 year old female who has been in the hospital for a left foot abscess. She had an incision and drainage at bedside with some drainage without improvement though. She has had continued significant pain and we have discussed an incision and drainage of her left foot. All risks, benefits, alternatives, and complications associated with surgery were discussed with the patient and informed consent has been given.   OPERATIVE PROCEDURE: The patient was brought into the OR and placed on the operating table in the supine position. General intubation was administered by the anesthesia team. The left lower extremity was prepped and draped in the usual sterile fashion. Attention was directed to the medial aspect of the first metatarsophalangeal joint where there was a noted abscessed area. A longitudinal incision was made medially. Sharp and blunt dissection was carried down to the deeper capsule. Upon further inspection there was noted to be a small amount of purulent drainage dorsally with worsening purulent drainage plantarly. Further blunt dissection was taken into the first web space showing an abscess into the region. I was able to drain a large amount of purulent drainage from this area. We did perform a deep space culture of the purulent drainage. At this time with the use of a pulsed lavage, 3 liters of saline was flushed throughout the plantar foot. Also with a bulb syringe I was able to drain the  deeper space abscess area as well. After drainage, no further infection was noted in the deeper areas. The wound was then packed with Nu Gauze packing and the proximal and distal portion of the incision was closed with nylon. This was then covered with a large bulky dressing. The patient tolerated the procedure and anesthesia well. A Marcaine block was placed around all areas. We will place her on a PCA for tonight. I will see her tomorrow for dressing change.    ____________________________ Argentina DonovanJustin A. Ether GriffinsFowler, DPM jaf:bjt D: 11/28/2011 17:21:36 ET T: 11/29/2011 12:53:27 ET JOB#: [id number]  cc: Jill AlexandersJustin A. Ether GriffinsFowler, DPM, <Dictator> Kailany Dinunzio DPM ELECTRONICALLY SIGNED 12/11/2011 12:04

## 2014-05-23 NOTE — Discharge Summary (Signed)
PATIENT NAME:  [name], [name] MR#:  [id number] DATE OF BIRTH:  15-Dec-1960  DATE OF ADMISSION:  11/23/2011 DATE OF DISCHARGE:  12/03/2011  For a detailed note, please take a look at the history and physical done on admission by Dr. Aram BeechamJeffrey Sparks.   DISCHARGE DIAGNOSES: 1. Left foot cellulitis/abscess status post incision and drainage.  2. Acute on chronic respiratory failure secondary to chronic obstructive pulmonary disease. 3. Chronic obstructive pulmonary disease, now oxygen dependent. 4. Diabetes. 5. Hypertension. 6. Diabetic neuropathy. 7. Acute renal failure. 8. Hyperlipidemia. 9. History of gout.  DISCHARGE DIET:  The patient is being discharged on a low-sodium, low-fat diet.   DISCHARGE ACTIVITY: As tolerated.  DISCHARGE FOLLOWUP: Followup with Dr. Gwyneth RevelsJustin Fowler of podiatry in the next 1 to 2 weeks. The patient also should followup with her primary care physician.   DISCHARGE MEDICATIONS: 1. Metformin 500 mg twice a day. 2. Klonopin 1 mg four times daily. 3. Os-Cal one cap daily.  4. Indomethacin 25 mg 1 to 2 caps three times daily as needed. 5. Famotidine 40 mg twice a day. 6. Zolpidem 10 mg at bedtime.  7. Tramadol 50 mg every 4 hours as needed.  8. Trazodone 200 mg at bedtime.  9. Lipitor 80 mg daily.  10. Cymbalta 60 mg daily.  11. Gabapentin 600 mg at bedtime. 12. Lyrica 50 mg twice a day. 13. Flexeril 5 mg three times daily. 14. Tylenol with hydrocodone 5/325 mg 1 to 2 tabs every four hours as needed for pain.  15. Enalapril 10 mg twice a day. 16. Insulin glargine 53 units at bedtime.  17. Insulin aspart 12 units three times daily before meals. 18. Ciprofloxacin 500 mg twice a day x7 days. 19. Combivent inhaler four times daily.   CONSULTANTS: Gwyneth RevelsJustin Fowler, MD - Podiatry.   PERTINENT STUDIES: Chest x-ray done on admission showed no evidence of acute cardiopulmonary disease.   MRI of the left foot done with contrast showed soft tissue edema along the  plantar aspect of the foot, edema is prominent about the volar aspect of the left great toe. These changes could be related to cellulitis and very early developing abscess along the volar aspect of the left great toe cannot be entirely excluded. No evidence of osteomyelitis.   BRIEF HOSPITAL COURSE: This is a 54 year old female with medical problems as mentioned above who presented to the hospital initially secondary to lethargy, altered mental status and noted to have significantly elevated blood sugars and also noted to be acidotic.  1. Altered mental status/encephalopathy. This was likely secondary to acute on chronic hypercarbic respiratory failure. The patient initially was thought to have diabetic ketoacidosis, although she had no elevated anion gap, she just had elevated sugars, which also contributed to her encephalopathy along with her hypercarbia. The patient was initially in the Intensive Care Unit, was started on BiPAP therapy. After being on BiPAP and as her hypercarbia improved her mental status came back to baseline. She was also given a non-DKA insulin drip and her sugars did come down with that. Her mental status is now currently back down to baseline.  2. The left lower extremity cellulitis/abscess formation. The patient was empirically started on IV antibiotics with ciprofloxacin and vancomycin. A podiatry consult was obtained. The patient was seen by Dr. Ether GriffinsFowler and underwent incision and drainage of the left foot abscess on 11/28/2011. The wound cultures grew out Enterobacter cloacae which is exactly what it grew out initially on hospitalization. The patient was therefore maintained  on target antibiotic therapy which is ciprofloxacin and the vancomycin was discontinued. The patient then again went back to the OR on 12/02/2011 for further incision and drainage of the left foot abscess as it was not healing well. As per podiatry, the patient now after being drained on 12/02/2011 is doing much  better and can be discharged on p.o. antibiotics and follow up with them as an outpatient. The patient is being arranged with home health services for dressing and wound care. The patient will be discharged on seven more days of p.o. ciprofloxacin.  3. Acute renal failure. This was likely secondary to dehydration and ATN. After some IV fluids, the patient's creatinine has now come back down to baseline.  4. Chronic obstructive pulmonary disease. The patient did have a mild exacerbation initially on admission as she was noted to be in hypercarbic respiratory failure and was on BiPAP for maybe 48 hours and has been off BiPAP since then. She did qualify for home oxygen which is being arranged for her. She received some IV steroids and finished her prednisone taper while being in the hospital. She is being discharged on Combivent inhaler as mentioned. She was strongly advised to quit smoking.  5. Diabetes. The patient did have some hyperglycemia as she was on IV steroids, although she will resume her Lantus, her NovoLog with meals along with her metformin upon discharge. Further titration of her diabetic medications can be done by her primary care physician.  6. Hypertension. The patient remained hemodynamically stable on enalapril and she will resume that.  7. Hyperlipidemia. The patient was maintained on Lipitor. She will resume that.  8. History of gout. There was no acute gout attack. She will resume her indomethacin as an outpatient.  9. Diabetic neuropathy. The patient was maintained on Neurontin and Lyrica. She will resume that upon discharge.   CODE STATUS: FULL CODE.   DISPOSITION: She is being discharged home with home health services.   TIME SPENT: 45 minutes.  ____________________________ Rolly Pancake. Cherlynn Kaiser, MD vjs:slb D: 12/03/2011 15:58:20 ET     T: 12/03/2011 17:23:53 ET         JOB#: [id number] cc: Rolly Pancake. Cherlynn Kaiser, MD, <Dictator> Argentina Donovan. Ether Griffins, DPM [place] Siren MD ELECTRONICALLY  SIGNED 12/11/2011 12:56

## 2014-05-23 NOTE — Consult Note (Signed)
PATIENT NAME:  [name], Teresa V MR#:  [id number] DATE OF BIRTH:  1960-03-17  DATE OF CONSULTATION:  11/25/2011  REFERRING PHYSICIAN:   CONSULTING PHYSICIAN:  Lealon Vanputten A. Ether GriffinsFowler, DPM  REASON FOR CONSULTATION: Left foot abscess.   HISTORY OF PRESENT ILLNESS: This is a 54 year old female admitted recently in diabetic ketoacidosis with blood sugar greater than 700. She was acidotic upon admission and  placed in the Critical Care Unit. Upon admission she was found to have a bullous area on her left great toe joint with fluid. There was concern for infection in the area and I was consulted for this.  PAST MEDICAL HISTORY:  1. Insulin-dependent diabetes. 2. History of headaches. 3. Insomnia. 4. Chronic back pain. 5. Gout.  6. Hypertension.  7. Hyperlipidemia.  8. Anxiety.   MEDICATIONS: Ambien, Ultram, metformin, Lantus, NovoLog, Lamictal, Indocin, Prozac, Pepcid, Vasotec, clonazepam, and Norco.  DRUG ALLERGIES: aspirin and ibuprofen.   SOCIAL HISTORY: She lives with her daughter. Denies alcohol. She does smoke.   REVIEW OF SYSTEMS: Noted left foot pain and that she has had some neuropathy that is progressively getting worse through the years. Noted short of breath and was admitted with some confusion with her DKA.   PHYSICAL EXAMINATION:   GENERAL: She is alert and oriented, in no apparent distress.   VASCULAR: She has a strongly palpable DP and PT pulse on the left foot.   NEUROLOGIC: She is neuropathic, although she does have some sensation to her left great toe joint area.   DERM: On the medial aspect of her left first metatarsophalangeal joint there is noted bulla with surrounding erythema that upon drainage there was some purulent drainage. She is hypersensitive and was not able to probe the area but suspect the infection is through the dermal layer. I cannot detect any capsule at this point.   ORTHO/MUSCULOSKELETAL: She has noted edema to the left foot compared to the right side.  Noted inability to move the left first metatarsophalangeal joint.   ASSESSMENT: Left foot abscess.   PLAN: I performed a bedside incision and drainage. I was able to prep the area with Betadine and then I was able to, with a 15 blade, incise through the skin. A fair amount of purulent drainage was noted. This was cultured and sent for aerobic and anaerobic evaluation. I was able to deroof the abscessed area. I flushed this out with 10 mL of saline. A sterile gauze was then placed on the left foot.   A MRI has already been ordered. I agree with this. We will see what the culture shows. If there is sepsis of the joint, we will plan on surgical intervention in the near future. We will try to change antibiotics as needed based on the wound culture.  ____________________________ Argentina DonovanJustin A. Ether GriffinsFowler, DPM jaf:slb D: 11/25/2011 13:02:36 ET T: 11/25/2011 13:07:09 ET JOB#: [id number]  cc: Jill AlexandersJustin A. Ether GriffinsFowler, DPM, <Dictator> Nastashia Gallo DPM ELECTRONICALLY SIGNED 11/28/2011 14:50

## 2014-05-26 NOTE — Discharge Summary (Signed)
PATIENT NAME:  [name], [name] MR#:  [id number] DATE OF BIRTH:  Jul 20, 1960  DATE OF ADMISSION:  07/30/2012 DATE OF DISCHARGE:  07/31/2012  PRIMARY CARE PHYSICIAN:  Dr. Liliane ShiWinter in [place].   PULMONOLOGIST:  Dr. Freda MunroSaadat Khan.   FINAL DIAGNOSES: 1.  Chest pain.  Cardiac enzymes were negative.  This is not a myocardial infarction.  Could be more musculoskeletal in nature.  2.  Chronic obstructive pulmonary disease exacerbation and upper airway congestion.  3.  Diabetes.  4.  Hypertension.  5.  Tobacco abuse.  6.  Hyperlipidemia.   MEDICATIONS ON DISCHARGE:  Include enalapril 10 mg daily, Lantus 72 units subcutaneous injection at bedtime, NovoLog 20 units subcutaneous injection 3 times a day before meals, Ambien 10 mg at bedtime, gabapentin 1200 mg at bedtime, Valium 5 mg 3 times a day, trazodone 200 mg at bedtime, Percocet 10/325 1 tablet every 6 hours, albuterol nebulizer as needed for shortness of breath, Prozac 20 mg daily, atorvastatin 20 mg at bedtime, Spiriva 1 inhalation daily, ProAir HFA 2 puffs 4 times a day, Flonase 50 mcg/inhalation one spray each nostril daily, Gralise 600 mg per 24-hour tablet extended-release 1 tablet daily, Brintellix 10 mg daily, metformin 500 mg twice a day, prednisone taper 5 mg 2 tablets day 1 and 2, 1 tablet day 3 and 4, 1/2 tablet day 5 and 6, ipratropium nasal spray two sprays each nostril twice a day, Levaquin 500 mg daily finish up your script from home, [facility] 2 puffs twice a day, nitroglycerin 0.4 mg sublingually every 5 minutes as needed for chest pain, max 3.   OXYGEN:  Nasal cannula 2 liters.   DIET:  Low-sodium diet, carbohydrate-controlled diet, regular consistency.   ACTIVITY:  As tolerated.   FOLLOW-UP:  With Dr. Welton FlakesKhan, pulmonary, as scheduled, 1 to 2 weeks.  [facility].   HOSPITAL COURSE:  The patient was admitted as an observation 07/30/2012 and discharged 07/31/2012.  The patient came in with  left shoulder pain, chest pain and dullness in the left arm, was admitted for an observation.  I believe that it was more musculoskeletal.  She has a stress test set up for July 17th.  She was started on IV steroids for COPD with wheezing for COPD exacerbation.  The patient has chronic respiratory failure.   LABORATORY AND RADIOLOGICAL DATA DURING THE HOSPITAL COURSE:  Included cardiac enzymes that were negative x 3.  Glucose 200, BUN 30, creatinine 1.04, sodium 138, potassium 3.9, chloride 106, CO2 25.  White blood cell count 11.8, H and H 14.1 and 41.8, platelet count 205.   Chest x-ray, lungs are expanded going along with underlying COPD.  No pneumonia.  Urinalysis negative.   HOSPITAL COURSE PER PROBLEM LIST:  1.  For the patient's chest pain, this is not a myocardial infarction.  Cardiac enzymes x 3 were negative.  On the weekends we only have limited availability on stress tests.  My associate did not order a stress test because the patient does have a follow-up appointment for a chemical stress test which is not able to be done on the weekend.  She will keep her outpatient appointment.  I did give her a script for nitroglycerin just in case she had more chest pain, but the thought is this is more musculoskeletal in nature.  2.  COPD exacerbation, upper airway congestion and chronic respiratory failure.  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  She was  started on IV steroids.  The strange thing here is when she breathes through her mouth I do hear an audible wheeze.  When she breathes through her nose her lungs are clear.  I believe this is due to more upper airway congestion and nasal congestion.  I did add ipratropium nasal spray.  I did put the patient on a quick prednisone taper and she will finish up her course of the Levaquin which she did have as an outpatient.  Her nebulizers and inhalers are the same.  The patient was taking both Dulera and Symbicort as outpatient and I told her that  these are both similar in nature and she can choose one and she chose the [facility].  3.  Diabetes.  Continue the Lantus and the NovoLog.  Sugars may be a little bit higher on the prednisone.  4.  Hypertension.  She is on her usual medications.  Blood pressure upon discharge 129/74.  5.  Tobacco abuse.  The patient must stop smoking.  6.  Hyperlipidemia.  She is on atorvastatin.  7.  No changes were made in her pain medications and psychiatric medications.   Time spent on discharge 35 minutes.    ____________________________ Herschell Dimes. Renae Gloss, MD rjw:ea D: 07/31/2012 14:46:35 ET T: 08/01/2012 01:24:53 ET JOB#: [id number]  cc: Herschell Dimes. Renae Gloss, MD, <Dictator> Yevonne Pax, MD Dr. Liliane Shi in [facility] Salley Scarlet MD ELECTRONICALLY SIGNED 08/02/2012 15:41

## 2014-05-26 NOTE — H&P (Signed)
PATIENT NAME:  [name], [name] MR#:  [id number] DATE OF BIRTH:  [birth year]  DATE OF ADMISSION:  07/30/2012  PRIMARY CARE PHYSICIAN:  Dr. Liliane Shi in [place]. PRIMARY PULMONOLOGIST:  Dr. Freda Munro.  CHIEF COMPLAINT: Left shoulder, chest pain with dullness in her left arm.   HISTORY OF PRESENTING ILLNESS: A 54 year old Caucasian female patient with history of severe endstage COPD, chronic respiratory failure on 2 liters oxygen, chronic pain syndrome with multiple surgeries on her neck, back and bilateral shoulders, with hypertension, diabetes, presented to the Emergency Room complaining of left upper chest/shoulder pain with dullness of the left arm. The patient has had this pain for a few months. The patient was seen by her pulmonologist, Dr. Welton Flakes, recently, who initially scheduled her for a treadmill stress test, but the patient could not finish the test secondary to her COPD. Later was scheduled for a nuclear stress test as an outpatient, which is scheduled at this time for 08/19/2012. The patient was told by Dr. Welton Flakes to go to the Emergency Room if she has any further pain in the chest. The patient has had this pain for the last few months, generally at rest and stays for about 1 to 2 hours and resolves on its own. This was sporadic and worsens with coughing and deep breathing when present.   No worsening in shortness of breath would pain. No nausea, vomiting, lightheadedness, palpitations, although the patient does have chronic nausea and vomiting. She has had neck surgeries in  the past, and since then, the patient had some mild dysphagia, tends to choke on pills, then follows  this with some nausea and vomiting, which resolved on its own.   PAST MEDICAL HISTORY: 1.  Insulin-dependent diabetes mellitus.  2.  Migraine headaches.  3.  Chronic insomnia.  4.  Chronic back pain.  5.  Gout.  6.  Hypertension.  7.  Hyperlipidemia.  8.  Anxiety/depression.  9.  C-spine surgery.  10.   Cholecystectomy.  11.  Rotator cuff surgery.   ALLERGIES: ASPIRIN AND IBUPROFEN.   SOCIAL HISTORY:  The patient lives with her daughter. She smokes 1/2 pack a day. Mentioned that she cut down from 2 packs a day. Does not drink alcohol. Ambulates on her own, wears 2 liters of oxygen at home.   CODE STATUS: Full code.   FAMILY HISTORY: Positive for diabetes, hypertension, coronary artery disease.   HOME MEDICATIONS:  1.  Acetaminophen/oxycodone 325/5, 1 to 2 tablets every 6 hours as needed.  2.  Combivent 2 puffs inhaled 4 times a day.  3.  Cymbalta 60 mg oral once a day.  4.  Enalapril 10 mg oral once a day.  5.  Gabapentin 600 mg oral once a day.  6.  Insulin aspart 12 subcutaneous 3 times a day.  7.  Insulin glargine 53 units subcutaneous once a day at bedtime.  8.  Lyrica 50 mg oral 3 times a day.  9.  Metformin 5 mg oral 2 times a day.  10.  Prednisone. The patient was on 10 mg a day, but stopped yesterday.  11.  Pulmicort 1 puff inhaled 2 times a day.   REVIEW OF SYSTEMS:  CONSTITUTIONAL:  Complains of fatigue, weakness. No weight loss, weight gain.  EYES:  No blurry vision, pain or redness.  ENT:  No tinnitus, ear pain, hearing loss.  RESPIRATORY:  Has chronic cough, wheezing and shortness of breath with COPD, chronic respiratory failure.  CARDIOVASCULAR:  Complains of left chest  pain. No edema, orthopnea.  GASTROINTESTINAL:  Has on-and-off chronic nausea and vomiting. No abdominal pain.  GENITOURINARY:  No dysuria, hematuria, frequency.  ENDOCRINE:  No polyuria, nocturia or thyroid problems.  HEMATOLOGIC/LYMPHATIC:  No anemia, easy bruising, bleeding.  INTEGUMENTARY:  No acne, rash, lesions.  MUSCULOSKELETAL:  Has chronic neck, back pain and arthritis.  NEUROLOGIC:  No focal numbness, weakness, dysarthria.  PSYCHIATRIC:  Has anxiety, depression.   PHYSICAL EXAMINATION: VITAL SIGNS:  Shows temperature 98, pulse of 92, respirations 22, blood pressure 119/63, saturating 95%  on 2 liters oxygen.  GENERAL:  Obese Caucasian female patient, sitting up in bed, cooperative with exam.  PSYCHIATRIC:  Alert and oriented x 3. Mood and affect appropriate. Judgment intact.  HEENT: Atraumatic, normocephalic. Oral mucosa moist and pink. External ears and nose normal. No pallor. No icterus. Pupils bilaterally equal, reactive to light.  NECK:  Supple. No thyromegaly. No palpable lymph nodes. Trachea midline. No carotid bruit, JVD.  CARDIOVASCULAR:  S1, S2. Left-side chest tenderness. No edema. Peripheral pulses 2+.  RESPIRATORY:  Bilateral wheezing, but good air entry. Not using accessory muscles.  GASTROINTESTINAL:  Soft abdomen, nontender. Bowel sounds present. No hepatosplenomegaly palpable.  GENITOURINARY:  No CVA tenderness or bladder distention.  SKIN:  Warm and dry. No petechiae, rash, ulcers.  MUSCULOSKELETAL:  No joint swelling, redness, effusion of the large joints. Normal muscle tone. Has some tenderness over the neck area without any swelling or redness.  NEUROLOGICAL:  Motor strength 5/5 in upper extremities. Sensation is intact all over.   DIAGNOSTICS AND LABORATORY DATA:  Today shows glucose 200, BUN 30, creatinine 1. Troponin less than 0.02. WBC 11.8, hemoglobin 14.1. EKG shows right bundle branch block, unchanged from prior EKG.   RADIOLOGIC DATA:  Chest x-ray shows chronic changes. No acute infiltrate, pneumothorax or effusion or edema.   ASSESSMENT AND PLAN: 1.  Left-sided chest/shoulder pain with dullness in the left arm. Has been a chronic problem on and off. It seems musculoskeletal from exam and symptoms. We will get 2 more sets of cardiac enzymes. The patient is scheduled for an outpatient nuclear stress test on 17th of July, which she needs to get done. I think the patient's pain is mostly from her rotator cuff surgery in the past, and also her COPD.  2.  Chronic obstructive pulmonary disease with chronic respiratory failure. The patient has significant  wheezing, but she mentions she always feels a little short of breath and wheezing at rest. Seems to be a chronic problem. Prednisone was stopped yesterday secondary to her elevated blood sugars. She was started on IV steroids, as her pain could be from worsening COPD. This can be switched to prednisone on discharge. She needs to follow up with Dr. Welton FlakesKhan as an outpatient. Continue home inhalers and DuoNeb p.r.n.  3.  Diabetes mellitus. Blood sugars are expected to run high with the IV steroids. We will put her on sliding scale insulin, home Lantus, diabetic diet.  4.  Hypertension, well controlled. Continue home medications.  5.  Chronic pain syndrome. Continue home pain medications.  6.  Vomiting. This happens with her choking on pills at times. Has been a chronic problem. Nausea medications as needed.  7.  Deep vein thrombosis prophylaxis with Lovenox.   Time spent today on this case was 48 minutes.     ____________________________ Teresa BailiffSrikar R. Nikalas Bramel, MD srs:dmm D: 07/30/2012 20:06:27 ET T: 07/30/2012 20:46:13 ET JOB#: [id number]  cc: Wardell HeathSrikar R. Rotunda Worden, MD, <Dictator> [facility] Saadat A.  Welton Flakes, MD Wardell Heath [place] Bali MD ELECTRONICALLY SIGNED 07/31/2012 2:46

## 2014-05-27 NOTE — Discharge Summary (Signed)
PATIENT NAME:  [name], Palmer W MR#:  [id number] DATE OF BIRTH:  May 19, 1960  DATE OF ADMISSION:  09/23/2013 DATE OF DISCHARGE:  09/25/2013  DISCHARGE DIAGNOSES:  1. Acute on chronic respiratory failure.  2. Acute chronic obstructive pulmonary disease exacerbation.  3. Tobacco abuse.  4. Uncontrolled diabetes mellitus.  5. Noncompliance.   DISCHARGE MEDICATIONS:  1. Ambien 10 mg daily at bedtime.  2. Spiriva 18 mcg daily.  3. ProAir HFA 2 puffs inhaled 4 times a day. 4. Flonase 50 mcg nasal once a day.  5. Dulera 2 puffs inhaled 2 times a day.  6. Alprazolam 1 mg oral 3 times a day as needed for anxiety.  7. Amitriptyline 25 mg once a day.  8. Percocet 10/325 every 4 hours as needed for pain.  9. Albuterol nebulizer every 6 hours as needed.  10. Gabapentin 300 mg 4 capsules oral once a day.  11. Lantus 72 units subcutaneous once a day at bedtime.  12. NovoLog 25 units subcutaneous 3 times a day before meals.  13. Trazodone 100 mg 2 tablets oral once a day.  14. Levaquin 500 mg oral once a day.  15. Prednisone 10 mg 60 mg tapered over 12 days.   IMAGING STUDIES DONE: Include a chest x-ray which showed resolved recently found left lower lobe infiltrate, no effusions. No pulmonary edema.   ADMITTING HISTORY AND PHYSICAL: Please see detailed H and P dictated by Dr. Randol KernElgergawy. In brief, a 54 year old Caucasian female patient with history of COPD, chronic respiratory failure, tobacco abuse presented to the hospital complaining of shortness of breath, wheezing, was found to have COPD exacerbation, acute on chronic respiratory failure and admitted to hospitalist service.   HOSPITAL COURSE:  1. Acute on chronic respiratory failure secondary to acute chronic obstructive pulmonary disease exacerbation. The patient has had recurrent chronic obstructive pulmonary disease exacerbation secondary to her ongoing tobacco abuse. The patient has been counseled to quit smoking for greater than 3 minutes on  day of discharge. The patient mentions that she will get a Nicotrol inhaler and try to quit smoking. She seems to be motivated at this point. Today, the patient still continues to have wheezing but states that she feels at baseline and has requested to be discharged home. She is just on 3 liters of oxygen, has chronic shortness of breath which is the same. She has improved well on intravenous steroids, nebulizers, antibiotics, and oxygen support.  2. Uncontrolled diabetes mellitus. The patient was continued on her Lantus home dose of insulin sliding scale. She does have elevated blood sugars, but these are secondary to high dose IV steroids, is being switched to prednisone and should improve with this.   Prior to discharge, the patient still has some wheezing on examination, but normal work of breathing. Heart sounds are S1, S2. No edema. Has ambulated without any assistance.   DISCHARGE INSTRUCTIONS: Low-salt, carb-controlled diet. Activity as tolerated. Continuous oxygen at 3  liters. The patient has been counseled to quit smoking. Follow up with primary care physician in 1 to 2 weeks and Dr. Welton FlakesKhan of pulmonology in 1 week.   TIME SPENT ON DAY OF DISCHARGE IN DISCHARGE ACTIVITY: 40 minutes.   ____________________________ Molinda BailiffSrikar R. Ramiyah Mcclenahan, MD srs:lt D: 09/25/2013 12:19:27 ET T: 09/25/2013 19:36:07 ET JOB#: [id number]  cc: Wardell HeathSrikar R. Elpidio AnisSudini, MD, <Dictator> Yevonne PaxSaadat A. Khan, MD Orie FishermanSRIKAR R Yeraldy Spike MD ELECTRONICALLY SIGNED 10/18/2013 16:30

## 2014-05-27 NOTE — H&P (Signed)
PATIENT NAME:  [name], [name] MR#:  [id number] DATE OF BIRTH:  [birth year]  DATE OF ADMISSION:  09/10/2013  ADDENDUM:   HOME MEDICATIONS: Trazodone 200 mg once a day at bedtime, Spiriva 18 mcg inhalation inhaled once a day, ProAir HFA CFC free 90 mcg 2 puffs 4 times a day, Percocet 10/325 mg 1 tablet every 6 hours, NovoLog 20 units subcutaneous 3 times before meals, Lantus 72 units subcutaneous once at bedtime, gabapentin 1200 mg p.o. once a day at bedtime, Flonase 50 mcg one spray once a day nasal, Dulera 5 mcg/100 mcg inhalation 2 puffs b.i.d., Ambien 10 mg p.o. at bedtime, Xanax 1 mg p.o. t.i.d. p.r.n., albuterol 2.5 mg per 3 mL inhaled p.r.n. for shortness of breath.    ____________________________ Shaune PollackQing Aleks Nawrot, MD qc:at D: 09/10/2013 17:14:50 ET T: 09/10/2013 18:19:30 ET JOB#: [id number]  cc: Shaune PollackQing Louan Base, MD, <Dictator> Shaune PollackQING Avary Pitsenbarger MD ELECTRONICALLY SIGNED 09/11/2013 15:39

## 2014-05-27 NOTE — H&P (Signed)
PATIENT NAME:  [name], [name] MR#:  [id number] DATE OF BIRTH:  1960/09/25  DATE OF ADMISSION:  09/10/2013  PRIMARY CARE PHYSICIAN: Dr. Freda Munro.    REFERRING PHYSICIAN: Dr. Carollee Massed.   CHIEF COMPLAINT: Cough, sputum, shortness of breath, and wheezing for 1 month, worsening for several days.   HISTORY OF PRESENT ILLNESS: A 54 year old Caucasian female with a history of hypertension, diabetes, COPD, presented to the ED with the above chief complaint. The patient is alert, awake, oriented, in no acute distress. She has had a chronic cough for 1 month. The patient developed a cough with sputum, shortness of breath, and wheezing for the past 1 month. She took antibiotics and nebulizer without improvement. The patient denies any fever or chills. No chest pain, orthopnea, nocturnal dyspnea. No leg edema. The patient's chest x-ray showed left-sided pneumonia. Dr. Carollee Massed admitted the patient for pneumonia.   PAST MEDICAL HISTORY: Hypertension, diabetes, hyperlipidemia, COPD, migraines, chronic back pain, anxiety.   PAST SURGICAL HISTORY: Anterior cervical diskectomy and cholecystectomy,   SOCIAL HISTORY: Smokes 1 to 2 packs a day for many years. Denies any alcohol drinking or illicit drugs.   FAMILY HISTORY: Mother had a heart attack. Father had throat cancer, father also smoking. Both  parents deceased.    ALLERGIES: ASPIRIN, IBUPROFEN, PENICILLIN.   REVIEW OF SYSTEMS: CONSTITUTIONAL: The patient denies any fever or chills. No headache or dizziness, but has generalized weakness.  EYES: No double vision or blurred vision.  ENT: No postnasal drip, slurred speech, or dysphagia.  CARDIOVASCULAR: No chest pain, palpitation, orthopnea, or nocturnal dyspnea. No leg edema.  PULMONARY: Positive for cough, sputum, shortness of breath, but no hemoptysis.  GASTROINTESTINAL: No abdominal pain, nausea, vomiting, or diarrhea. No melena or bloody stools.  GENITOURINARY: No dysuria, hematuria, or  incontinence.  SKIN: No rash or jaundice.  NEUROLOGY: No syncope, loss of consciousness, or seizure.  ENDOCRINE: No polyuria, polydipsia, heat or cold intolerance.  HEMATOLOGY: No easy bruising or bleeding.   PHYSICAL EXAMINATION: VITAL SIGNS: Temperature 97.5, blood pressure 115/88, pulse 75, oxygen saturation 98% with oxygen 2 liters by nasal cannula.  GENERAL: The patient is alert, awake, oriented, in no acute distress.  HEENT: Pupils round, equal, and reactive to light and accommodation. Moist oral mucosa. Clear pharynx.  NECK: Supple. No JVD or carotid bruit. No thyromegaly.  CARDIOVASCULAR: S1, S2, regular rate and rhythm. No murmurs or gallops.  PULMONARY: Bilateral air entry. Bilateral expiratory wheezing with some crackles. No use of accessory muscles to breathe.  ABDOMEN: Soft. Mild diffuse tenderness. No rigidity. No rebound. Bowel sounds present. No organomegaly.  EXTREMITIES: No edema, clubbing, or cyanosis. No calf tenderness. Bilateral pedal pulses present.  SKIN: No rash or jaundice.  NEUROLOGIC: A and O x 3. No focal deficit. Power 5/5. Sensation intact.   LABORATORY DATA: Chest x-ray shows persistent left lower lobe infiltrate. CBC in normal range. Troponin less than 0.02, glucose 378, BUN 19, creatinine 1.2, sodium 133, potassium 4.4, chloride 97, bicarbonate 26. A CAT scan of chest 3 days ago showed new patchy airspace disease, suspect suspicious for pneumonia. EKG showed normal sinus rhythm at 82 BPM with a right bundle branch block.   IMPRESSIONS: 1.  Persistent pneumonia.  2.  Chronic obstructive pulmonary disease exacerbation.  3.  Hyponatremia.  4.  Hypertension.  5.  Diabetes.  6.  Hyperlipidemia.  7.  Anxiety.  8.  Diabetes.  9.  Tobacco abuse.   PLAN OF TREATMENT:  1.  The patient will be  admitted to the medical floor. We will start Levaquin IV PB and start DuoNebs, Solu-Medrol.  Continue the patient's home medications of Spiriva, Advair, and follow up CBC,  sputum culture.  2.  For hyponatremia, we will give normal saline IV and follow up BMP.  3.  For diabetes, we will start a sliding scale and Levemir.  4.  Smoking cessation was counseled for 4 minutes. Discussed the patient's condition and plan of treatment with the patient. The patient wants full code.   TIME SPENT: About 56 minutes.    ____________________________ Shaune PollackQing Rafan Sanders, MD qc:at D: 09/10/2013 17:09:18 ET T: 09/10/2013 17:58:21 ET JOB#: [id number]  cc: Shaune PollackQing Querida Beretta, MD, <Dictator> Shaune PollackQING Dandrae Kustra MD ELECTRONICALLY SIGNED 09/11/2013 15:38

## 2014-05-27 NOTE — Discharge Summary (Signed)
PATIENT NAME:  [name], [name] MR#:  [id number] DATE OF BIRTH:  [birth year]  DATE OF ADMISSION:  09/10/2013 DATE OF DISCHARGE:  09/12/2013  PRESENTING COMPLAINT: Shortness of breath and cough.   DISCHARGE DIAGNOSES:  1. Pneumonia.  2. Acute on chronic obstructive pulmonary disease flare.  3. Ongoing tobacco abuse.  4. Uncontrolled diabetes. 5. Saturations 100% on 3 liters. The patient wears oxygen at home chronically.   CODE STATUS: Full code.   MEDICATIONS:  1. Lantus 72 units subcutaneous at bedtime.  2. NovoLog 20 units t.i.d. before meals.  3. Ambien 10 mg at bedtime.  4. Gabapentin 1200 mg at bedtime.  5. Trazodone 200 mg daily.  6. Albuterol nebulizer as needed q. 4 p.r.n.  7. Spiriva 18 mcg inhalation daily.  8. ProAir HFA 2 puffs 4 times a day.  9. Flonase 50 mcg/inhalations 1 spray daily.  10. Dulera 5/100 mcg per inhalation 2 puffs b.i.d.  11. Alprazolam 1 mg t.i.d. as needed.  12. Xanax 1 mg orally at bedtime.  13. Amitriptyline 25 mg at bedtime.  14. Percocet 10/325 one tablet 4 hours as needed.  15. Levaquin 500 mg daily.  16. Prednisone tapered.  17. Oxygen 3 liters nasal cannula.   FOLLOWUP:  1. Follow up with your primary care physician in 1 to 2 weeks.  2. Follow up with her pulmonologist Dr. Freda MunroSaadat Khan in 2 weeks.   BRIEF SUMMARY OF HOSPITAL COURSE: [name] is a 54 year old Caucasian female with a history of COPD and diabetes with ongoing tobacco abuse, comes in with:  1. Left lower lobe pneumonia. She was started on IV Levaquin, changed to p.o. Levaquin upon discharge. Remained afebrile. White count was stable.  2. Chronic obstructive pulmonary disease exacerbation, acute on chronic. She was started on IV Solu-Medrol, nebulizers, and inhalers. The patient was recommended p.o. taper of steroids and resume her home oxygen as before.  3. Hypertension. Continued home medications.  4. Poorly controlled diabetes. Not sure if she takes her insulin well  although she does report taking regularly. The patient was resumed back on her insulin regimen and dietary instructions discussed.  5. Chronic anxiety. Continue home medications.  6. History of tobacco abuse. The patient reported that she did not smoke; however, she was found on occasions out of her room into the parking lot and presumedly trying to smoke.  7. Hospital stay otherwise remained stable.   CODE STATUS: The patient remained a full code.   TIME SPENT: 40 minutes.    ____________________________ Wylie HailSona A. Allena KatzPatel, MD sap:lt D: 09/13/2013 14:47:00 ET T: 09/13/2013 17:26:31 ET JOB#: [id number]  cc: Vianca Bracher A. Allena KatzPatel, MD, <Dictator> Willow OraSONA A Saramarie Stinger MD ELECTRONICALLY SIGNED 09/14/2013 13:51

## 2014-05-27 NOTE — H&P (Signed)
PATIENT NAME:  [name], REPOSA MR#:  [id number] DATE OF BIRTH:  [birth year]  DATE OF ADMISSION:  09/23/2013  REFERRING PHYSICIAN: Dr. Inocencio Homes.   PRIMARY CARE PHYSICIAN: Dr. Nicholaus Corolla.   CHIEF COMPLAINT: Shortness of breath, cough, productive sputum.   HISTORY OF PRESENT ILLNESS: This is a 54 year old female with known history of COPD, chronic respiratory failure on 3 liters nasal cannula at baseline presents with worsening shortness of breath, cough, productive sputum over the last 3 days. Reports it is green in color, recently discharged from [facility] for diagnosis of pneumonia. On August 9th the patient's chest x-ray showing resolution of her pneumonia. At this point, the patient is afebrile, does not have any leukocytosis. She had significant wheezing upon presentation which much improved after receiving nebulizer treatments and Solu-Medrol, she denies any chest pain, any hemoptysis, any dysuria or polyuria, diarrhea, constipation. The patient was hypoxic. She is on 3 liters at baseline. She was saturating in the mid 80s on 3 liters at baseline, which we had to increase. Hospitalists were requested to admit the patient.   PAST MEDICAL HISTORY: Hypertension, diabetes, hyperlipidemia, COPD, migraine, chronic back pain and anxiety.   PAST SURGICAL HISTORY: Anterior cervical diskectomy and cholecystectomy.   SOCIAL HISTORY: Smokes 1 to 2 packs a day. No alcohol. No illicit drug use.   FAMILY HISTORY: Significant for heart attack or cancer.   ALLERGIES: ASPIRIN, IBUPROFEN, PENICILLIN AND VICODIN.   REVIEW OF SYSTEMS:  CONSTITUTIONAL: Denies any fever. Complains of fatigue, weakness, poor appetite.  EYES: Denies blurry vision, double vision, inflammation.  EARS, NOSE AND THROAT: Denies tinnitus, hearing loss, epistaxis.  RESPIRATORY: Reports cough, productive sputum, wheezing, shortness of breath and COPD, denies hemoptysis.  CARDIOVASCULAR: Denies chest pain, edema, palpitation,  syncope.  GASTROINTESTINAL: Denies nausea, vomiting, diarrhea, abdominal pain.  GENITOURINARY: Denies dysuria, hematuria, or renal colic.  ENDOCRINE: Denies polyuria, polydipsia, heat or cold intolerance.  HEMATOLOGY: Denies anemia, easy bruising, bleeding diathesis.  INTEGUMENT: Denies acne, rash, or any skin lesion.  MUSCULOSKELETAL: Denies any swelling, gout, arthritis, cramps.  NEUROLOGIC: Denies CVA, TIA, tremors, vertigo, ataxia.  PSYCHIATRIC: Denies anxiety, insomnia, or depression.   HOME MEDICATIONS: Percocet as needed, gabapentin 1200 mg at bedtime, amitriptyline 25 mg at bedtime, trazodone 200 mg at bedtime, Lantus 72 units at bedtime, NovoLog 25 units 3 times a day before meals, alprazolam 1 mg oral 2 times a day as needed, Ambien 10 mg at bedtime, Xanax 1 mg at bedtime, albuterol as needed, Dulera 2 puffs b.i.d., Spiriva 18 mcg inhalational daily.   PHYSICAL EXAMINATION:  VITAL SIGNS: Temperature 98.2, pulse 98, respiratory rate 21, blood pressure 150/77, saturating 98% on oxygen.  GENERAL: Elderly frail female, appears much older than her stated age in moderate respiratory distress.  HEENT: Head atraumatic, normocephalic. Pupils are equal and reactive to light. Pink conjunctivae. Anicteric sclerae. Moist oral mucosa. No oral thrush.  NECK: Supple. No thyromegaly. No JVD. No carotid bruits. No lymphadenopathy.  CHEST: Fair air entry bilaterally with scattered wheezing. No rales or rhonchi. Mild use of accessory muscle.  CARDIOVASCULAR: S1, S2 heard. No rubs, murmurs, gallops. Regular but tachycardic. PMI nondisplaced.  ABDOMEN: Soft, nontender, nondistended. Bowel sounds present. No rebound, no guarding.  EXTREMITIES: No edema. No clubbing. No cyanosis. Pedal pulses +2 bilaterally.  PSYCHIATRIC: Appropriate affect. Awake, alert x 3. Intact judgment and insight.  NEUROLOGIC: Cranial nerves grossly intact. Motor 5/5 and sensation symmetrical to light touch.  MUSCULOSKELETAL: No  joint effusion or erythema.  SKIN: Normal skin  turgor. Warm and dry.  LYMPHATICS: No cervical or supraclavicular lymphadenopathy.   PERTINENT LABORATORIES: Glucose 555, BUN 17, creatinine 1.07, sodium 129, potassium 4.3, chloride 92, CO2 28. Anion gap 9. Troponin less than 0.02. White blood cells 10.5, hemoglobin 14.1, hematocrit 42.2, platelets 162,000. Chest x-ray showing recent left lower lobe pneumonia, resolved.   ASSESSMENT AND PLAN:  1. Acute on chronic respiratory failure. This is due to chronic obstructive pulmonary disease  exacerbation. Currently the patient tolerating nasal cannula. No need for BiPAP. We will monitor closely.  2. Chronic obstructive pulmonary disease exacerbation. The patient will be started on Solu-Medrol and Levaquin and we will continue on Dulera, Spiriva and p.r.n. albuterol.  3. Hypertension. Blood pressure acceptable. Continue with home medications.  4. Diabetes mellitus, uncontrolled. The patient was given fluid bolus. We will resume her back on her Lantus. We will add insulin sliding scale at high scale.  5. Anxiety. Continue with home medicine.  6. Tobacco abuse: The patient was counseled. Will be started on nicotine patch.  7. Deep vein thrombosis prophylaxis. Subcutaneous heparin and sequential compression devices.   CODE STATUS: Discussed with the patient. She is a full code.   TOTAL TIME SPENT ON ADMISSION AND PATIENT CARE: 55 minutes.    ____________________________ Starleen Armsawood S. Ramon Brant, MD dse:JT D: 09/23/2013 23:57:32 ET T: 09/24/2013 00:48:10 ET JOB#: [id number]  cc: Starleen Armsawood S. Willet Schleifer, MD, <Dictator> Ivorie Uplinger Teena IraniS Mirl Hillery MD ELECTRONICALLY SIGNED 09/24/2013 4:13

## 2014-09-12 ENCOUNTER — Encounter: Payer: Self-pay | Admitting: Emergency Medicine

## 2014-09-12 ENCOUNTER — Other Ambulatory Visit: Payer: Self-pay

## 2014-09-12 ENCOUNTER — Emergency Department
Admission: EM | Admit: 2014-09-12 | Discharge: 2014-09-12 | Disposition: A | Discharge status: Home / Self Care | Payer: Medicaid Other | Attending: Emergency Medicine | Admitting: Emergency Medicine

## 2014-09-12 ENCOUNTER — Emergency Department: Payer: Medicaid Other

## 2014-09-12 DIAGNOSIS — J441 Chronic obstructive pulmonary disease with (acute) exacerbation: Secondary | ICD-10-CM | POA: Insufficient documentation

## 2014-09-12 DIAGNOSIS — Z720 Tobacco use: Secondary | ICD-10-CM | POA: Diagnosis not present

## 2014-09-12 DIAGNOSIS — R000 Tachycardia, unspecified: Secondary | ICD-10-CM | POA: Insufficient documentation

## 2014-09-12 DIAGNOSIS — F419 Anxiety disorder, unspecified: Secondary | ICD-10-CM | POA: Diagnosis not present

## 2014-09-12 DIAGNOSIS — E1165 Type 2 diabetes mellitus with hyperglycemia: Secondary | ICD-10-CM | POA: Diagnosis not present

## 2014-09-12 DIAGNOSIS — R0602 Shortness of breath: Secondary | ICD-10-CM | POA: Diagnosis present

## 2014-09-12 DIAGNOSIS — I10 Essential (primary) hypertension: Secondary | ICD-10-CM | POA: Insufficient documentation

## 2014-09-12 HISTORY — DX: Other specified forms of tremor: G25.2

## 2014-09-12 HISTORY — DX: Type 2 diabetes mellitus without complications: E11.9

## 2014-09-12 HISTORY — DX: Anxiety disorder, unspecified: F41.9

## 2014-09-12 HISTORY — DX: Essential (primary) hypertension: I10

## 2014-09-12 HISTORY — DX: Hyperlipidemia, unspecified: E78.5

## 2014-09-12 HISTORY — DX: Chronic obstructive pulmonary disease, unspecified: J44.9

## 2014-09-12 HISTORY — DX: Insomnia, unspecified: G47.00

## 2014-09-12 HISTORY — DX: Unspecified asthma, uncomplicated: J45.909

## 2014-09-12 LAB — TROPONIN I: Troponin I: <0.03 ng/mL (ref <0.031)

## 2014-09-12 LAB — CBC
HCT: 43.8 % (ref 35.0–47.0)
Hemoglobin: 14.9 g/dL (ref 12.0–16.0)
MCH: 30.8 pg (ref 26.0–34.0)
MCHC: 33.9 g/dL (ref 32.0–36.0)
MCV: 90.7 fL (ref 80.0–100.0)
Platelets: 217 10*3/uL (ref 150–440)
RBC: 4.83 MIL/uL (ref 3.80–5.20)
RDW: 14.4 % (ref 11.5–14.5)
WBC: 10.2 10*3/uL (ref 3.6–11.0)

## 2014-09-12 LAB — BASIC METABOLIC PANEL
Anion gap: 14 (ref 5–15)
BUN: 10 mg/dL (ref 6–20)
CHLORIDE: 91 mmol/L — AB (ref 101–111)
CO2: 27 mmol/L (ref 22–32)
Calcium: 8.9 mg/dL (ref 8.9–10.3)
Creatinine, Ser: 0.81 mg/dL (ref 0.44–1.00)
GFR calc Af Amer: >60 mL/min (ref >60)
GFR calc non Af Amer: >60 mL/min (ref >60)
GLUCOSE: 429 mg/dL — AB (ref 65–99)
Potassium: 3.7 mmol/L (ref 3.5–5.1)
SODIUM: 132 mmol/L — AB (ref 135–145)

## 2014-09-12 LAB — GLUCOSE, CAPILLARY
GLUCOSE-CAPILLARY: 419 mg/dL — AB (ref 65–99)
Glucose-Capillary: 437 mg/dL — ABNORMAL HIGH (ref 65–99)

## 2014-09-12 MED ORDER — AZITHROMYCIN 250 MG PO TABS
500.0000 mg | ORAL_TABLET | ORAL | Status: AC
  Administered 2014-09-12: 500 mg via ORAL
  Filled 2014-09-12: qty 2

## 2014-09-12 MED ORDER — LORAZEPAM 2 MG/ML IJ SOLN
1.0000 mg | Freq: Once | INTRAMUSCULAR | Status: AC
  Administered 2014-09-12: 1 mg via INTRAVENOUS
  Filled 2014-09-12: qty 1

## 2014-09-12 MED ORDER — INSULIN ASPART 100 UNIT/ML ~~LOC~~ SOLN
10.0000 [IU] | Freq: Once | SUBCUTANEOUS | Status: AC
  Administered 2014-09-12: 10 [IU] via SUBCUTANEOUS
  Filled 2014-09-12: qty 10

## 2014-09-12 MED ORDER — PREDNISONE 20 MG PO TABS
60.0000 mg | ORAL_TABLET | ORAL | Status: AC
  Administered 2014-09-12: 60 mg via ORAL
  Filled 2014-09-12: qty 3

## 2014-09-12 MED ORDER — OXYCODONE-ACETAMINOPHEN 5-325 MG PO TABS
1.0000 | ORAL_TABLET | Freq: Once | ORAL | Status: AC
  Administered 2014-09-12: 1 via ORAL
  Filled 2014-09-12: qty 1

## 2014-09-12 MED ORDER — SODIUM CHLORIDE 0.9 % IV BOLUS (SEPSIS)
1000.0000 mL | INTRAVENOUS | Status: AC
  Administered 2014-09-12: 1000 mL via INTRAVENOUS

## 2014-09-12 MED ORDER — PREDNISONE 20 MG PO TABS: 60.0000 mg | ORAL_TABLET | Freq: Every day | ORAL | Status: DC

## 2014-09-12 MED ORDER — IPRATROPIUM-ALBUTEROL 0.5-2.5 (3) MG/3ML IN SOLN
3.0000 mL | Freq: Once | RESPIRATORY_TRACT | Status: AC
  Administered 2014-09-12: 3 mL via RESPIRATORY_TRACT
  Filled 2014-09-12: qty 3

## 2014-09-12 MED ORDER — GABAPENTIN 300 MG PO CAPS
900.0000 mg | ORAL_CAPSULE | ORAL | Status: AC
  Administered 2014-09-12: 900 mg via ORAL
  Filled 2014-09-12: qty 3

## 2014-09-12 MED ORDER — AZITHROMYCIN 250 MG PO TABS: ORAL_TABLET | ORAL | Status: DC

## 2014-09-12 NOTE — ED Notes (Signed)
Patient transported to X-ray 

## 2014-09-12 NOTE — ED Notes (Addendum)
Re checked sugar before discharge. CBG=419.  Pt was given applesauce and graham crackers and PO prednisone.  Patient discharging home and will take her scheduled insulin for lunch. Dr. York Cerise notified.

## 2014-09-12 NOTE — ED Provider Notes (Addendum)
[facility] Emergency Department Provider Note  ____________________________________________  Time seen: Approximately 12:07 PM  I have reviewed the triage vital signs and the nursing notes.   HISTORY  Chief Complaint Shortness of Breath    HPI [name] is a 54 y.o. female with extensive past medical history including COPD on 3 L of oxygen at baseline, insulin-dependent diabetes, hypertension, hyperlipidemia, essential tremor, anxiety, and chronic pain/neuropathy.  She presents with gradual onset of shortness of breath since last night which is been steadily worsening since that time in spite of taking her breathing treatments at home.  She says this has happened in the past.  She denies chest pain, vomiting(but does endorse some nausea), and abdominal pain.  She has back pain and pain in her hands and legs which she describes as normal for her neuropathy.  She has not been taking any of her medications since her breathing problems worsen during the night last night, so she missed all her morning medications including her insulin and pain medications.  She describes her symptoms as severe and nothing makes them better.  Exertion makes her breathing worse.  Primary care doctor is Dr. Pricilla Riffle at [facility] family medicine.  She last saw him in June.  She was supposed to see him several weeks ago but had canceled her appointment due to no transportation.   Past Medical History  Diagnosis Date  . Diabetes mellitus without complication   . Hypertension   . COPD (chronic obstructive pulmonary disease)   . Asthma   . Hyperlipidemia   . Insomnia   . Coarse tremors   . Anxiety     There are no active problems to display for this patient.   History reviewed. No pertinent past surgical history.  No current outpatient prescriptions on file.  Allergies Review of patient's allergies indicates no known allergies.  History reviewed. No pertinent family  history.  Social History History  Substance Use Topics  . Smoking status: Current Every Day Smoker  . Smokeless tobacco: Not on file  . Alcohol Use: No    Review of Systems Constitutional: No fever/chills Eyes: No visual changes. ENT: No sore throat. Cardiovascular: Denies chest pain. Respiratory: Steadily worsening shortness of breath since last night with wheezing Gastrointestinal: No abdominal pain.  No ausea, no vomiting.  No diarrhea.  No constipation. Genitourinary: Negative for dysuria. Musculoskeletal: Acute on chronic back pain and extremity neuropathy. Skin: Negative for rash. Neurological: Negative for headaches, focal weakness or numbness. Psychiatric:Anxious, more than usual 10-point ROS otherwise negative.  ____________________________________________   PHYSICAL EXAM:  VITAL SIGNS: ED Triage Vitals  Enc Vitals Group     BP 09/12/14 1110 136/77 mmHg     Pulse Rate 09/12/14 1110 98     Resp 09/12/14 1110 20     Temp 09/12/14 1110 97.5 F (36.4 C)     Temp Source 09/12/14 1110 Oral     SpO2 09/12/14 1110 100 %     Weight 09/12/14 1110 158 lb (71.668 kg)     Height 09/12/14 1110 5\' 3"  (1.6 m)     Head Cir --      Peak Flow --      Pain Score 09/12/14 1111 7     Pain Loc --      Pain Edu? --      Excl. in GC? --     Constitutional: Alert and oriented but anxious and in mild to moderate distress.  The smell of tobacco smoke  is clearly evident upon entering the room. Eyes: Conjunctivae are normal. PERRL. EOMI. Head: Atraumatic. Nose: No congestion/rhinnorhea. Mouth/Throat: Mucous membranes are moist.  Oropharynx non-erythematous. Neck: No stridor.   Cardiovascular: Borderline tachycardia ranging from 90 to 100, regular rhythm. Grossly normal heart sounds.  Good peripheral circulation. Respiratory: Normal respiratory effort.  No retractions.  Coarse breath sounds throughout with expiratory wheezing and increased expiratory phase.  Patient is on her  baseline 3 L of oxygen by nasal cannula Gastrointestinal: Soft and nontender. No distention. No abdominal bruits. No CVA tenderness. Musculoskeletal: No lower extremity tenderness nor edema.  No joint effusions. Neurologic:  Normal speech and language. No gross focal neurologic deficits are appreciated.  Patient has a coarse tremor of the lateral upper extremities that she states is chronic. Skin:  Skin is warm, dry and intact. No rash noted. Psychiatric: The patient is anxious but appropriate.  ____________________________________________   LABS (all labs ordered are listed, but only abnormal results are displayed)  Labs Reviewed  BASIC METABOLIC PANEL - Abnormal; Notable for the following:    Sodium 132 (*)    Chloride 91 (*)    Glucose, Bld 429 (*)    All other components within normal limits  GLUCOSE, CAPILLARY - Abnormal; Notable for the following:    Glucose-Capillary 437 (*)    All other components within normal limits  CBC  TROPONIN I   ____________________________________________  EKG  ED ECG REPORT I, Jude Naclerio, the attending physician, personally viewed and interpreted this ECG.   Date: 09/12/2014  EKG Time: 11:13  Rate: 98  Rhythm: normal sinus rhythm  Axis: Normal  Intervals:right bundle branch block  ST&T Change: Non-specific ST segment / T-wave changes, but no evidence of acute ischemia.  Consistent with prior EKGs.   ____________________________________________  RADIOLOGY  Marylou Mccoy, personally viewed and evaluated these images as part of my medical decision making.   Dg Chest 2 View  09/12/2014   CLINICAL DATA:  Shortness of breath and wheezing since this morning.  EXAM: CHEST  2 VIEW  COMPARISON:  04/21/2014.  FINDINGS: The cardiac silhouette, mediastinal and hilar contours are within normal limits and stable. Stable emphysematous changes and bronchitic type interstitial changes. No infiltrates, edema or effusions. The bony thorax is intact.  Stable cervical fusion hardware.  IMPRESSION: Mild emphysematous changes and chronic bronchitic changes but no infiltrates, edema or effusions.   Electronically Signed   By: Rudie Meyer M.D.   On: 09/12/2014 12:02    ____________________________________________   PROCEDURES  Procedure(s) performed: None  Critical Care performed: No ____________________________________________   INITIAL IMPRESSION / ASSESSMENT AND PLAN / ED COURSE  Pertinent labs & imaging results that were available during my care of the patient were reviewed by me and considered in my medical decision making (see chart for details).  My initial impression this patient is that she is suffering from acute COPD exacerbation and that her symptoms are worse due to her nonadherence to medication regimen since she started feeling ill last night with her breathing.  He has multiple chronic medical issues in addition to the COPD and regularly takes high doses of gabapentin and regular scheduled doses of Percocet and amitriptyline.  Not taking these are likely worsening her anxiety, tremors, and overall malaise.  Her EKG is consistent with prior EKGs in the system and her lab results are reassuring.  She has coarse wheezing with increased expiratory phase throughout which is not unexpected given her history of COPD on 3 L  of oxygen at baseline.  I will treat with 3 DuoNeb labs, prednisone 60 mg by mouth, and 1 L of fluids as she has borderline tachycardia and is likely not been taking good by mouth intake in addition to her hyperglycemia from not taking her insulin this morning.  I will give her regular insulin 10 mg subcutaneous (I verified that her regular doses 21 mg subcutaneous 3 times a day with meals, but she has not been eating since last night).  I will also give her her regular gabapentin 900 mg by mouth and 1 Percocet 5-3 25.  I anticipate that these treatments will significantly improve her discomfort.  I anticipate that she  will be appropriate for discharge and outpatient follow-up with her primary care doctor at [facility].  ----------------------------------------- 1:43 PM on 09/12/2014 -----------------------------------------  The patient is feeling better after her medications and her treatments.  She no longer has expiratory wheezing that is audible without a stethoscope, though she still has some mild wheezing with auscultation.  She is still very anxious and reminded me that she has not taken her regular Xanax; she states that she takes 1 mg about 4 times a day.  I did not see this on her primary care note because it is prescribed by her therapist.  She is slightly tachycardic at this time but I believe this is mostly due to the anxiety, not taking her benzos, and her recent albuterol.  I will provide Ativan 1 mg IV.  She states that she is feeling better and does agree that outpatient follow-up is the best course of treatment; she does not want to stay in the hospital at this time and I do not believe that inpatient admission is appropriate or indicated.  I gave her my usual and customary return precautions.     ____________________________________________  FINAL CLINICAL IMPRESSION(S) / ED DIAGNOSES  Final diagnoses:  Acute exacerbation of chronic obstructive pulmonary disease (COPD)  Hyperglycemia due to type 2 diabetes mellitus  Chronic anxiety      NEW MEDICATIONS STARTED DURING THIS VISIT:  New Prescriptions   AZITHROMYCIN (ZITHROMAX) 250 MG TABLET    Take 1 tablet PO daily starting on 09/13/14 for 4 days   PREDNISONE (DELTASONE) 20 MG TABLET    Take 3 tablets (60 mg total) by mouth daily.      Loleta Rose, MD 09/12/14 [phone]

## 2014-09-12 NOTE — ED Notes (Addendum)
Pt to ed with c/o sob, difficulty breathing and back pain that started during the night,  Pt states she has tried multiple breathing treatments without relief. Pt sats at 100% on 3lpm.

## 2014-09-12 NOTE — Discharge Instructions (Signed)
We believe that your symptoms are caused today by an exacerbation of your COPD, and possibly bronchitis.  Please take the prescribed medications and any medications that you have at home for your COPD.  Follow up with your doctor as recommended (call tomorrow for the next available appointment).  If you develop any new or worsening symptoms, including but not limited to fever, persistent vomiting, worsening shortness of breath, chest pain, or other symptoms that concern you, please return to the Emergency Department immediately.   Chronic Obstructive Pulmonary Disease Chronic obstructive pulmonary disease (COPD) is a common lung condition in which airflow from the lungs is limited. COPD is a general term that can be used to describe many different lung problems that limit airflow, including both chronic bronchitis and emphysema. If you have COPD, your lung function will probably never return to normal, but there are measures you can take to improve lung function and make yourself feel better.  CAUSES   Smoking (common).   Exposure to secondhand smoke.   Genetic problems.  Chronic inflammatory lung diseases or recurrent infections. SYMPTOMS   Shortness of breath, especially with physical activity.   Deep, persistent (chronic) cough with a large amount of thick mucus.   Wheezing.   Rapid breaths (tachypnea).   Gray or bluish discoloration (cyanosis) of the skin, especially in fingers, toes, or lips.   Fatigue.   Weight loss.   Frequent infections or episodes when breathing symptoms become much worse (exacerbations).   Chest tightness. DIAGNOSIS  Your health care provider will take a medical history and perform a physical examination to make the initial diagnosis. Additional tests for COPD may include:   Lung (pulmonary) function tests.  Chest X-ray.  CT scan.  Blood tests. TREATMENT  Treatment available to help you feel better when you have COPD includes:    Inhaler and nebulizer medicines. These help manage the symptoms of COPD and make your breathing more comfortable.  Supplemental oxygen. Supplemental oxygen is only helpful if you have a low oxygen level in your blood.   Exercise and physical activity. These are beneficial for nearly all people with COPD. Some people may also benefit from a pulmonary rehabilitation program. HOME CARE INSTRUCTIONS   Take all medicines (inhaled or pills) as directed by your health care provider.  Avoid over-the-counter medicines or cough syrups that dry up your airway (such as antihistamines) and slow down the elimination of secretions unless instructed otherwise by your health care provider.   If you are a smoker, the most important thing that you can do is stop smoking. Continuing to smoke will cause further lung damage and breathing trouble. Ask your health care provider for help with quitting smoking. He or she can direct you to community resources or hospitals that provide support.  Avoid exposure to irritants such as smoke, chemicals, and fumes that aggravate your breathing.  Use oxygen therapy and pulmonary rehabilitation if directed by your health care provider. If you require home oxygen therapy, ask your health care provider whether you should purchase a pulse oximeter to measure your oxygen level at home.   Avoid contact with individuals who have a contagious illness.  Avoid extreme temperature and humidity changes.  Eat healthy foods. Eating smaller, more frequent meals and resting before meals may help you maintain your strength.  Stay active, but balance activity with periods of rest. Exercise and physical activity will help you maintain your ability to do things you want to do.  Preventing infection and hospitalization  is very important when you have COPD. Make sure to receive all the vaccines your health care provider recommends, especially the pneumococcal and influenza vaccines. Ask  your health care provider whether you need a pneumonia vaccine.  Learn and use relaxation techniques to manage stress.  Learn and use controlled breathing techniques as directed by your health care provider. Controlled breathing techniques include:   Pursed lip breathing. Start by breathing in (inhaling) through your nose for 1 second. Then, purse your lips as if you were going to whistle and breathe out (exhale) through the pursed lips for 2 seconds.   Diaphragmatic breathing. Start by putting one hand on your abdomen just above your waist. Inhale slowly through your nose. The hand on your abdomen should move out. Then purse your lips and exhale slowly. You should be able to feel the hand on your abdomen moving in as you exhale.   Learn and use controlled coughing to clear mucus from your lungs. Controlled coughing is a series of short, progressive coughs. The steps of controlled coughing are:  1. Lean your head slightly forward.  2. Breathe in deeply using diaphragmatic breathing.  3. Try to hold your breath for 3 seconds.  4. Keep your mouth slightly open while coughing twice.  5. Spit any mucus out into a tissue.  6. Rest and repeat the steps once or twice as needed. SEEK MEDICAL CARE IF:   You are coughing up more mucus than usual.   There is a change in the color or thickness of your mucus.   Your breathing is more labored than usual.   Your breathing is faster than usual.  SEEK IMMEDIATE MEDICAL CARE IF:   You have shortness of breath while you are resting.   You have shortness of breath that prevents you from:  Being able to talk.   Performing your usual physical activities.   You have chest pain lasting longer than 5 minutes.   Your skin color is more cyanotic than usual.  You measure low oxygen saturations for longer than 5 minutes with a pulse oximeter. MAKE SURE YOU:   Understand these instructions.  Will watch your condition.  Will get help  right away if you are not doing well or get worse. Document Released: 10/30/2004 Document Revised: 06/06/2013 Document Reviewed: 09/16/2012 [facility] Patient Information 2015 [place], [place]. This information is not intended to replace advice given to you by your health care provider. Make sure you discuss any questions you have with your health care provider.

## 2015-07-17 ENCOUNTER — Inpatient Hospital Stay
Admission: EM | Admit: 2015-07-17 | Discharge: 2015-07-23 | DRG: 208 | Disposition: A | Discharge status: Home / Self Care | Payer: Medicaid Other | Attending: Pulmonary Disease | Admitting: Pulmonary Disease

## 2015-07-17 ENCOUNTER — Encounter: Payer: Self-pay | Admitting: Emergency Medicine

## 2015-07-17 ENCOUNTER — Emergency Department: Payer: Medicaid Other

## 2015-07-17 DIAGNOSIS — E1140 Type 2 diabetes mellitus with diabetic neuropathy, unspecified: Secondary | ICD-10-CM | POA: Diagnosis present

## 2015-07-17 DIAGNOSIS — J441 Chronic obstructive pulmonary disease with (acute) exacerbation: Secondary | ICD-10-CM | POA: Diagnosis not present

## 2015-07-17 DIAGNOSIS — Z8249 Family history of ischemic heart disease and other diseases of the circulatory system: Secondary | ICD-10-CM

## 2015-07-17 DIAGNOSIS — Z794 Long term (current) use of insulin: Secondary | ICD-10-CM | POA: Diagnosis not present

## 2015-07-17 DIAGNOSIS — E785 Hyperlipidemia, unspecified: Secondary | ICD-10-CM | POA: Diagnosis present

## 2015-07-17 DIAGNOSIS — J9600 Acute respiratory failure, unspecified whether with hypoxia or hypercapnia: Secondary | ICD-10-CM | POA: Diagnosis not present

## 2015-07-17 DIAGNOSIS — J9690 Respiratory failure, unspecified, unspecified whether with hypoxia or hypercapnia: Secondary | ICD-10-CM

## 2015-07-17 DIAGNOSIS — J9622 Acute and chronic respiratory failure with hypercapnia: Secondary | ICD-10-CM | POA: Diagnosis not present

## 2015-07-17 DIAGNOSIS — F17210 Nicotine dependence, cigarettes, uncomplicated: Secondary | ICD-10-CM | POA: Diagnosis present

## 2015-07-17 DIAGNOSIS — E875 Hyperkalemia: Secondary | ICD-10-CM | POA: Diagnosis present

## 2015-07-17 DIAGNOSIS — J9602 Acute respiratory failure with hypercapnia: Secondary | ICD-10-CM | POA: Diagnosis not present

## 2015-07-17 DIAGNOSIS — D649 Anemia, unspecified: Secondary | ICD-10-CM | POA: Diagnosis present

## 2015-07-17 DIAGNOSIS — G9340 Encephalopathy, unspecified: Secondary | ICD-10-CM | POA: Diagnosis not present

## 2015-07-17 DIAGNOSIS — Z634 Disappearance and death of family member: Secondary | ICD-10-CM

## 2015-07-17 DIAGNOSIS — J9601 Acute respiratory failure with hypoxia: Secondary | ICD-10-CM | POA: Diagnosis not present

## 2015-07-17 DIAGNOSIS — T380X5A Adverse effect of glucocorticoids and synthetic analogues, initial encounter: Secondary | ICD-10-CM | POA: Diagnosis not present

## 2015-07-17 DIAGNOSIS — R000 Tachycardia, unspecified: Secondary | ICD-10-CM | POA: Diagnosis not present

## 2015-07-17 DIAGNOSIS — B9620 Unspecified Escherichia coli [E. coli] as the cause of diseases classified elsewhere: Secondary | ICD-10-CM | POA: Diagnosis present

## 2015-07-17 DIAGNOSIS — M545 Low back pain: Secondary | ICD-10-CM | POA: Diagnosis present

## 2015-07-17 DIAGNOSIS — Z79899 Other long term (current) drug therapy: Secondary | ICD-10-CM

## 2015-07-17 DIAGNOSIS — E1165 Type 2 diabetes mellitus with hyperglycemia: Secondary | ICD-10-CM | POA: Diagnosis present

## 2015-07-17 DIAGNOSIS — J9621 Acute and chronic respiratory failure with hypoxia: Secondary | ICD-10-CM | POA: Diagnosis not present

## 2015-07-17 DIAGNOSIS — N179 Acute kidney failure, unspecified: Secondary | ICD-10-CM | POA: Diagnosis present

## 2015-07-17 DIAGNOSIS — E860 Dehydration: Secondary | ICD-10-CM | POA: Diagnosis present

## 2015-07-17 DIAGNOSIS — N3000 Acute cystitis without hematuria: Secondary | ICD-10-CM | POA: Diagnosis present

## 2015-07-17 DIAGNOSIS — Z7951 Long term (current) use of inhaled steroids: Secondary | ICD-10-CM

## 2015-07-17 DIAGNOSIS — E876 Hypokalemia: Secondary | ICD-10-CM | POA: Diagnosis not present

## 2015-07-17 DIAGNOSIS — J449 Chronic obstructive pulmonary disease, unspecified: Secondary | ICD-10-CM | POA: Diagnosis present

## 2015-07-17 DIAGNOSIS — Z9289 Personal history of other medical treatment: Secondary | ICD-10-CM

## 2015-07-17 DIAGNOSIS — I10 Essential (primary) hypertension: Secondary | ICD-10-CM | POA: Diagnosis present

## 2015-07-17 DIAGNOSIS — F4321 Adjustment disorder with depressed mood: Secondary | ICD-10-CM | POA: Diagnosis present

## 2015-07-17 DIAGNOSIS — E872 Acidosis: Secondary | ICD-10-CM | POA: Diagnosis not present

## 2015-07-17 DIAGNOSIS — G8929 Other chronic pain: Secondary | ICD-10-CM | POA: Diagnosis present

## 2015-07-17 DIAGNOSIS — Z9981 Dependence on supplemental oxygen: Secondary | ICD-10-CM

## 2015-07-17 DIAGNOSIS — N289 Disorder of kidney and ureter, unspecified: Secondary | ICD-10-CM

## 2015-07-17 DIAGNOSIS — Z720 Tobacco use: Secondary | ICD-10-CM | POA: Diagnosis not present

## 2015-07-17 LAB — URINALYSIS COMPLETE WITH MICROSCOPIC (ARMC ONLY)
BILIRUBIN URINE: NEGATIVE
GLUCOSE, UA: 150 mg/dL — AB
KETONES UR: NEGATIVE mg/dL
NITRITE: NEGATIVE
PH: 5 (ref 5.0–8.0)
SPECIFIC GRAVITY, URINE: 1.013 (ref 1.005–1.030)

## 2015-07-17 LAB — COMPREHENSIVE METABOLIC PANEL
ALT: 10 U/L — AB (ref 14–54)
AST: 15 U/L (ref 15–41)
Albumin: 2.9 g/dL — ABNORMAL LOW (ref 3.5–5.0)
Alkaline Phosphatase: 94 U/L (ref 38–126)
Anion gap: 12 (ref 5–15)
BUN: 36 mg/dL — ABNORMAL HIGH (ref 6–20)
CHLORIDE: 98 mmol/L — AB (ref 101–111)
CO2: 24 mmol/L (ref 22–32)
CREATININE: 1.55 mg/dL — AB (ref 0.44–1.00)
Calcium: 8.8 mg/dL — ABNORMAL LOW (ref 8.9–10.3)
GFR calc non Af Amer: 37 mL/min — ABNORMAL LOW (ref >60)
GFR, EST AFRICAN AMERICAN: 42 mL/min — AB (ref >60)
Glucose, Bld: 270 mg/dL — ABNORMAL HIGH (ref 65–99)
Potassium: 3.3 mmol/L — ABNORMAL LOW (ref 3.5–5.1)
SODIUM: 134 mmol/L — AB (ref 135–145)
Total Bilirubin: 0.8 mg/dL (ref 0.3–1.2)
Total Protein: 7.9 g/dL (ref 6.5–8.1)

## 2015-07-17 LAB — CBC
HCT: 34 % — ABNORMAL LOW (ref 35.0–47.0)
Hemoglobin: 11.1 g/dL — ABNORMAL LOW (ref 12.0–16.0)
MCH: 29.5 pg (ref 26.0–34.0)
MCHC: 32.7 g/dL (ref 32.0–36.0)
MCV: 90.2 fL (ref 80.0–100.0)
PLATELETS: 179 10*3/uL (ref 150–440)
RBC: 3.77 MIL/uL — AB (ref 3.80–5.20)
RDW: 14.1 % (ref 11.5–14.5)
WBC: 13.1 10*3/uL — AB (ref 3.6–11.0)

## 2015-07-17 LAB — LIPASE, BLOOD: LIPASE: 11 U/L (ref 11–51)

## 2015-07-17 LAB — TROPONIN I: Troponin I: <0.03 ng/mL (ref <0.031)

## 2015-07-17 LAB — TSH: TSH: 0.952 u[IU]/mL (ref 0.350–4.500)

## 2015-07-17 LAB — BRAIN NATRIURETIC PEPTIDE: B NATRIURETIC PEPTIDE 5: 257 pg/mL — AB (ref 0.0–100.0)

## 2015-07-17 LAB — GLUCOSE, CAPILLARY: Glucose-Capillary: 305 mg/dL — ABNORMAL HIGH (ref 65–99)

## 2015-07-17 MED ORDER — GABAPENTIN 300 MG PO CAPS
300.0000 mg | ORAL_CAPSULE | Freq: Three times a day (TID) | ORAL | Status: DC
  Administered 2015-07-17: 300 mg via ORAL
  Filled 2015-07-17: qty 1

## 2015-07-17 MED ORDER — ALPRAZOLAM 0.5 MG PO TABS: 1.0000 mg | ORAL_TABLET | Freq: Three times a day (TID) | ORAL | Status: DC | PRN

## 2015-07-17 MED ORDER — PNEUMOCOCCAL VAC POLYVALENT 25 MCG/0.5ML IJ INJ: 0.5000 mL | INJECTION | INTRAMUSCULAR | Status: DC

## 2015-07-17 MED ORDER — DEXTROSE 5 % IV SOLN
1.0000 g | Freq: Once | INTRAVENOUS | Status: AC
  Administered 2015-07-17: 1 g via INTRAVENOUS
  Filled 2015-07-17: qty 10

## 2015-07-17 MED ORDER — ONDANSETRON HCL 4 MG PO TABS: 4.0000 mg | ORAL_TABLET | Freq: Four times a day (QID) | ORAL | Status: DC | PRN

## 2015-07-17 MED ORDER — LISINOPRIL 5 MG PO TABS: 2.5000 mg | ORAL_TABLET | Freq: Every day | ORAL | Status: DC

## 2015-07-17 MED ORDER — SODIUM CHLORIDE 0.9 % IV BOLUS (SEPSIS)
1000.0000 mL | Freq: Once | INTRAVENOUS | Status: AC
  Administered 2015-07-17: 1000 mL via INTRAVENOUS

## 2015-07-17 MED ORDER — DEXTROSE 5 % IV SOLN
1.0000 g | INTRAVENOUS | Status: DC
  Filled 2015-07-17: qty 10

## 2015-07-17 MED ORDER — POLYETHYLENE GLYCOL 3350 17 G PO PACK: 17.0000 g | PACK | Freq: Every day | ORAL | Status: DC | PRN

## 2015-07-17 MED ORDER — INSULIN ASPART 100 UNIT/ML ~~LOC~~ SOLN
6.0000 [IU] | Freq: Once | SUBCUTANEOUS | Status: AC
  Administered 2015-07-17: 6 [IU] via INTRAVENOUS
  Filled 2015-07-17: qty 6

## 2015-07-17 MED ORDER — SODIUM CHLORIDE 0.9% FLUSH
3.0000 mL | Freq: Two times a day (BID) | INTRAVENOUS | Status: DC
  Administered 2015-07-17 – 2015-07-23 (×12): 3 mL via INTRAVENOUS

## 2015-07-17 MED ORDER — MIRTAZAPINE 15 MG PO TABS: 15.0000 mg | ORAL_TABLET | Freq: Every day | ORAL | Status: DC

## 2015-07-17 MED ORDER — INSULIN ASPART 100 UNIT/ML ~~LOC~~ SOLN: 0.0000 [IU] | Freq: Three times a day (TID) | SUBCUTANEOUS | Status: DC

## 2015-07-17 MED ORDER — ONDANSETRON HCL 4 MG/2ML IJ SOLN
4.0000 mg | Freq: Once | INTRAMUSCULAR | Status: AC
  Administered 2015-07-17: 4 mg via INTRAVENOUS
  Filled 2015-07-17: qty 2

## 2015-07-17 MED ORDER — AMITRIPTYLINE HCL 10 MG PO TABS: 10.0000 mg | ORAL_TABLET | Freq: Every evening | ORAL | Status: DC | PRN

## 2015-07-17 MED ORDER — ONDANSETRON HCL 4 MG/2ML IJ SOLN: 4.0000 mg | Freq: Four times a day (QID) | INTRAMUSCULAR | Status: DC | PRN

## 2015-07-17 MED ORDER — POTASSIUM CHLORIDE CRYS ER 20 MEQ PO TBCR: 40.0000 meq | EXTENDED_RELEASE_TABLET | Freq: Once | ORAL | Status: DC

## 2015-07-17 MED ORDER — INSULIN GLARGINE 100 UNIT/ML ~~LOC~~ SOLN
72.0000 [IU] | Freq: Every day | SUBCUTANEOUS | Status: DC
  Administered 2015-07-17: 72 [IU] via SUBCUTANEOUS
  Filled 2015-07-17 (×2): qty 0.72

## 2015-07-17 MED ORDER — METFORMIN HCL 500 MG PO TABS: 500.0000 mg | ORAL_TABLET | Freq: Two times a day (BID) | ORAL | Status: DC

## 2015-07-17 MED ORDER — ZOLPIDEM TARTRATE 5 MG PO TABS: 5.0000 mg | ORAL_TABLET | Freq: Every evening | ORAL | Status: DC | PRN

## 2015-07-17 MED ORDER — INSULIN ASPART 100 UNIT/ML ~~LOC~~ SOLN
0.0000 [IU] | Freq: Every day | SUBCUTANEOUS | Status: DC
  Administered 2015-07-17: 22:00:00 4 [IU] via SUBCUTANEOUS
  Filled 2015-07-17: qty 4

## 2015-07-17 MED ORDER — SODIUM CHLORIDE 0.9 % IV BOLUS (SEPSIS)
500.0000 mL | Freq: Once | INTRAVENOUS | Status: AC
  Administered 2015-07-17: 500 mL via INTRAVENOUS

## 2015-07-17 MED ORDER — AMITRIPTYLINE HCL 25 MG PO TABS
75.0000 mg | ORAL_TABLET | Freq: Every day | ORAL | Status: DC
  Administered 2015-07-17: 75 mg via ORAL
  Filled 2015-07-17: qty 1

## 2015-07-17 MED ORDER — OXYCODONE-ACETAMINOPHEN 5-325 MG PO TABS
1.0000 | ORAL_TABLET | ORAL | Status: DC | PRN
  Administered 2015-07-18: 1 via ORAL
  Filled 2015-07-17: qty 1

## 2015-07-17 MED ORDER — MOMETASONE FURO-FORMOTEROL FUM 200-5 MCG/ACT IN AERO
2.0000 | INHALATION_SPRAY | Freq: Two times a day (BID) | RESPIRATORY_TRACT | Status: DC
  Filled 2015-07-17: qty 8.8

## 2015-07-17 MED ORDER — ATORVASTATIN CALCIUM 20 MG PO TABS: 40.0000 mg | ORAL_TABLET | Freq: Every day | ORAL | Status: DC

## 2015-07-17 MED ORDER — HYDROCOD POLST-CPM POLST ER 10-8 MG/5ML PO SUER
10.0000 mL | Freq: Two times a day (BID) | ORAL | Status: DC
  Administered 2015-07-17: 10 mL via ORAL
  Filled 2015-07-17: qty 10

## 2015-07-17 MED ORDER — ACETAMINOPHEN 650 MG RE SUPP: 650.0000 mg | Freq: Four times a day (QID) | RECTAL | Status: DC | PRN

## 2015-07-17 MED ORDER — DOCUSATE SODIUM 100 MG PO CAPS
100.0000 mg | ORAL_CAPSULE | Freq: Two times a day (BID) | ORAL | Status: DC
  Administered 2015-07-17: 21:00:00 100 mg via ORAL
  Filled 2015-07-17: qty 1

## 2015-07-17 MED ORDER — ENOXAPARIN SODIUM 40 MG/0.4ML ~~LOC~~ SOLN
40.0000 mg | SUBCUTANEOUS | Status: DC
  Administered 2015-07-17 – 2015-07-22 (×6): 40 mg via SUBCUTANEOUS
  Filled 2015-07-17 (×6): qty 0.4

## 2015-07-17 MED ORDER — MORPHINE SULFATE (PF) 2 MG/ML IV SOLN
2.0000 mg | INTRAVENOUS | Status: DC | PRN
  Administered 2015-07-17: 2 mg via INTRAVENOUS
  Filled 2015-07-17: qty 1

## 2015-07-17 MED ORDER — ATORVASTATIN CALCIUM 20 MG PO TABS
40.0000 mg | ORAL_TABLET | Freq: Every day | ORAL | Status: DC
  Administered 2015-07-17: 21:00:00 40 mg via ORAL
  Filled 2015-07-17: qty 2

## 2015-07-17 MED ORDER — TRAZODONE HCL 50 MG PO TABS: 150.0000 mg | ORAL_TABLET | Freq: Every evening | ORAL | Status: DC | PRN

## 2015-07-17 MED ORDER — ACETAMINOPHEN 325 MG PO TABS: 650.0000 mg | ORAL_TABLET | Freq: Four times a day (QID) | ORAL | Status: DC | PRN

## 2015-07-17 MED ORDER — INSULIN ASPART 100 UNIT/ML ~~LOC~~ SOLN: 7.0000 [IU] | Freq: Three times a day (TID) | SUBCUTANEOUS | Status: DC

## 2015-07-17 MED ORDER — IPRATROPIUM-ALBUTEROL 0.5-2.5 (3) MG/3ML IN SOLN
3.0000 mL | Freq: Once | RESPIRATORY_TRACT | Status: AC
  Administered 2015-07-17: 3 mL via RESPIRATORY_TRACT
  Filled 2015-07-17: qty 3

## 2015-07-17 MED ORDER — METHYLPREDNISOLONE SODIUM SUCC 125 MG IJ SOLR: 60.0000 mg | INTRAMUSCULAR | Status: DC

## 2015-07-17 MED ORDER — OXYCODONE-ACETAMINOPHEN 5-325 MG PO TABS: 1.0000 | ORAL_TABLET | Freq: Four times a day (QID) | ORAL | Status: DC | PRN

## 2015-07-17 MED ORDER — IPRATROPIUM-ALBUTEROL 0.5-2.5 (3) MG/3ML IN SOLN
3.0000 mL | RESPIRATORY_TRACT | Status: DC
  Administered 2015-07-17 – 2015-07-18 (×4): 3 mL via RESPIRATORY_TRACT
  Filled 2015-07-17 (×5): qty 3

## 2015-07-17 MED ORDER — HYDROMORPHONE HCL 1 MG/ML IJ SOLN
1.0000 mg | Freq: Once | INTRAMUSCULAR | Status: AC
  Administered 2015-07-17: 1 mg via INTRAVENOUS
  Filled 2015-07-17: qty 1

## 2015-07-17 MED ORDER — BENZONATATE 100 MG PO CAPS
100.0000 mg | ORAL_CAPSULE | Freq: Three times a day (TID) | ORAL | Status: DC
  Administered 2015-07-17: 21:00:00 100 mg via ORAL
  Filled 2015-07-17: qty 1

## 2015-07-17 MED ORDER — TIOTROPIUM BROMIDE MONOHYDRATE 18 MCG IN CAPS
18.0000 ug | ORAL_CAPSULE | Freq: Every day | RESPIRATORY_TRACT | Status: DC
  Filled 2015-07-17: qty 5

## 2015-07-17 MED ORDER — SODIUM CHLORIDE 0.9 % IV SOLN
INTRAVENOUS | Status: DC
  Administered 2015-07-17: 22:00:00 via INTRAVENOUS

## 2015-07-17 NOTE — H&P (Signed)
[facility] - [place] at [facility] [address]   PATIENT NAME: [name]    MR#:  [id number]  DATE OF BIRTH:  1960/11/27  DATE OF ADMISSION:  07/17/2015  PRIMARY CARE PHYSICIAN: No primary care provider on file.   REQUESTING/REFERRING PHYSICIAN: Dr. Lacretia Nicks  CHIEF COMPLAINT:   Chief Complaint  Patient presents with  . Back Pain    HISTORY OF PRESENT ILLNESS:  [name]  is a 55 y.o. female with a known history of Insulin-dependent diabetes mellitus, COPD on 3 L oxygen, hypertension, diabetic neuropathy, chronic low back pain and history of back surgeries presents to Hospital from home secondary to weakness, inability to stand and confusion. Patient has been grieving since her son's death last week from drug overdose. She denies any signs of active depression or suicidal ideation. She hasn't been sleeping well. She has been having trouble with ambulation from her neuropathy and also back surgeries. Had an MRI of her lower back done in April 2017 by PCP that showed scar tissue and also bilateral foraminal narrowing. She was supposed to follow up with spine clinic and also needs physical therapy. Her weakness has been worsening over the last week. She hasn't had much sleep due to grief. Also hasn't been compliant with her medications and also her oxygen. She appears to be wheezing and dyspneic on presentation today. Labs indicate dehydration and renal failure. Chest x-ray without any acute findings but urine with infection.  PAST MEDICAL HISTORY:   Past Medical History  Diagnosis Date  . 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 Hypertension   . COPD (chronic obstructive pulmonary disease) (HCC)     on 3L home o2  . Asthma   . Hyperlipidemia   . Insomnia   . Coarse tremors   . Anxiety     PAST SURGICAL HISTORY:   Past Surgical History  Procedure Laterality Date  . Back surgery      x 2 lumbar spine  . Foot surgery     . Bronchoscopy    . Cervical spine surgery    . Cholecystectomy      SOCIAL HISTORY:   Social History  Substance Use Topics  . Smoking status: Current Every Day Smoker -- 0.50 packs/day    Types: Cigarettes  . Smokeless tobacco: Not on file  . Alcohol Use: No    FAMILY HISTORY:   Family History  Problem Relation Age of Onset  . CAD Mother   . Hypertension Mother   . Throat cancer Father     DRUG ALLERGIES:  No Known Allergies  REVIEW OF SYSTEMS:   Review of Systems  Constitutional: Positive for chills and malaise/fatigue. Negative for fever and weight loss.  HENT: Negative for ear discharge, ear pain, hearing loss and nosebleeds.   Eyes: Negative for blurred vision, double vision and photophobia.  Respiratory: Positive for cough and shortness of breath. Negative for hemoptysis and wheezing.   Cardiovascular: Negative for chest pain, palpitations, orthopnea and leg swelling.  Gastrointestinal: Positive for nausea. Negative for heartburn, vomiting, abdominal pain, diarrhea, constipation and melena.  Genitourinary: Positive for dysuria. Negative for urgency and frequency.  Musculoskeletal: Positive for myalgias and back pain. Negative for neck pain.  Skin: Negative for rash.  Neurological: Negative for dizziness, tingling, sensory change, speech change, focal weakness and headaches.  Endo/Heme/Allergies: Does not bruise/bleed easily.  Psychiatric/Behavioral: Positive for depression. The patient is nervous/anxious.     MEDICATIONS AT HOME:  Prior to Admission medications   Medication Sig Start Date End Date Taking? Authorizing Provider  albuterol (ACCUNEB) 0.63 MG/3ML nebulizer solution Take 1 ampule by nebulization every 6 (six) hours as needed for wheezing.    Historical Provider, MD  ALPRAZolam Prudy Feeler(XANAX) 1 MG tablet Take 1 mg by mouth 3 (three) times daily as needed for anxiety. Take one tablet 3 time a day and one tablet at bedtime as needed    Historical Provider,  MD  amitriptyline (ELAVIL) 10 MG tablet Take 10 mg by mouth 3 (three) times daily as needed for sleep.    Historical Provider, MD  atorvastatin (LIPITOR) 20 MG tablet Take 40 mg by mouth daily.    Historical Provider, MD  azithromycin (ZITHROMAX) 250 MG tablet Take 1 tablet PO daily starting on 09/13/14 for 4 days 09/13/14   Loleta Roseory Forbach, MD  diazepam (VALIUM) 5 MG tablet Take 5 mg by mouth 3 (three) times daily as needed for anxiety. Take one tablet 3 times a day and at bedtime as needed.    Historical Provider, MD  Fluticasone-Salmeterol (ADVAIR) 250-50 MCG/DOSE AEPB Inhale 1 puff into the lungs 2 (two) times daily.    Historical Provider, MD  gabapentin (NEURONTIN) 300 MG capsule Take 300-600 mg by mouth 3 (three) times daily.    Historical Provider, MD  insulin aspart (NOVOLOG) 100 UNIT/ML injection Inject 21 Units into the skin 3 (three) times daily.    Historical Provider, MD  insulin glargine (LANTUS) 100 UNIT/ML injection Inject 72 Units into the skin at bedtime.    Historical Provider, MD  lisinopril (PRINIVIL,ZESTRIL) 2.5 MG tablet Take 2.5 mg by mouth daily.    Historical Provider, MD  metFORMIN (GLUCOPHAGE) 500 MG tablet Take 500 mg by mouth 2 (two) times daily.    Historical Provider, MD  mirtazapine (REMERON) 15 MG tablet Take 15 mg by mouth at bedtime.    Historical Provider, MD  oxyCODONE-acetaminophen (PERCOCET/ROXICET) 5-325 MG per tablet Take 1 tablet by mouth every 6 (six) hours as needed for severe pain. Take one tablet by mouth every 6-8 hours and one tablet at bedtime as needed.    Historical Provider, MD  predniSONE (DELTASONE) 20 MG tablet Take 3 tablets (60 mg total) by mouth daily. 09/12/14   Loleta Roseory Forbach, MD  traZODone (DESYREL) 100 MG tablet Take 100-200 mg by mouth at bedtime as needed for sleep.    Historical Provider, MD  zolpidem (AMBIEN) 10 MG tablet Take 10 mg by mouth at bedtime as needed for sleep.    Historical Provider, MD      VITAL SIGNS:  Blood pressure  126/63, pulse 95, temperature 98.6 F (37 C), temperature source Oral, resp. rate 28, height 5\' 3"  (1.6 m), weight 74.844 kg (165 lb), SpO2 94 %.  PHYSICAL EXAMINATION:   Physical Exam  GENERAL:  55 y.o.-year-old deconditioned appearing patient lying in the bed with no acute distress.  EYES: Pupils equal, round, reactive to light and accommodation. No scleral icterus. Extraocular muscles intact.  HEENT: Head atraumatic, normocephalic. Oropharynx and nasopharynx clear.  NECK:  Supple, no jugular venous distention. No thyroid enlargement, no tenderness.  LUNGS: diffuse coarse wheezing bilaterally, no rales,rhonchi or crepitation. No use of accessory muscles of respiration.  CARDIOVASCULAR: S1, S2 normal. No murmurs, rubs, or gallops.  ABDOMEN: Soft, nontender, nondistended. Bowel sounds present. No organomegaly or mass.  EXTREMITIES: No pedal edema or cyanosis. Positive for finger clubbing NEUROLOGIC: Cranial nerves II through XII are intact. Muscle strength 5/5 in all  extremities. Sensation intact. Gait not checked. Global weakness noted, decreased sensation of both feet secondary to her neuropathy PSYCHIATRIC: The patient is alert and oriented x 2- 3.  SKIN: No obvious rash, lesion, or ulcer.   LABORATORY PANEL:   CBC  Recent Labs Lab 07/17/15 1455  WBC 13.1*  HGB 11.1*  HCT 34.0*  PLT 179   ------------------------------------------------------------------------------------------------------------------  Chemistries   Recent Labs Lab 07/17/15 1455  NA 134*  K 3.3*  CL 98*  CO2 24  GLUCOSE 270*  BUN 36*  CREATININE 1.55*  CALCIUM 8.8*  AST 15  ALT 10*  ALKPHOS 94  BILITOT 0.8   ------------------------------------------------------------------------------------------------------------------  Cardiac Enzymes  Recent Labs Lab 07/17/15 1455  TROPONINI <0.03    ------------------------------------------------------------------------------------------------------------------  RADIOLOGY:  Dg Chest 2 View  07/17/2015  CLINICAL DATA:  Cough, green sputum for 1 week, shortness of Breath cough EXAM: CHEST  2 VIEW COMPARISON:  09/12/2014 FINDINGS: Cardiomediastinal silhouette is stable. Metallic fixation plate cervical spine again noted. No acute infiltrate or pleural effusion. No pulmonary edema. Mild degenerative changes mid and lower thoracic spine. IMPRESSION: No active cardiopulmonary disease. Electronically Signed   By: Natasha Mead M.D.   On: 07/17/2015 15:28    EKG:   Orders placed or performed during the hospital encounter of 07/17/15  . ED EKG  . ED EKG  . EKG 12-Lead  . EKG 12-Lead    IMPRESSION AND PLAN:   [name]  is a 55 y.o. female with a known history of Insulin-dependent diabetes mellitus, COPD on 3 L oxygen, hypertension, diabetic neuropathy, chronic low back pain and history of back surgeries presents to Hospital from home secondary to weakness, inability to stand and confusion.  #1 acute cystitis- urine cultures ordered Started on rocephin  #2 hypokalemia- being replaced  #3 acute renal failure- likely prerenal. Gentle hydration and monitor.  #4 COPD exacerbation- and in yesterday to smoke about half pack per day. Counseled against it. Started on IV Solu-Medrol. -On scheduled DuoNeb's and also continue her inhalers. -Chest x-ray clear without any infiltrates or bronchitis changes. Cough meds added  #5 chronic low back pain and neuropathy pain-continue pain medications as needed. Also on gabapentin.  #6 depression and anxiety- emotional during conversation due to her son's death recently. But denies any active depression or suicidal ideation. Patient is already on Remeron, trazodone-will continue that. Also on Xanax for anxiety  #7 uncontrolled diabetes mellitus-check A1c. Last A1c of 11 from April 2017 -Artery  noncompliance issues. Restart her Lantus at bedtime. Also decreased her NovoLog pre-meal insulin dose due to history of hypoglycemia. -Sliding scale insulin. Also diabetes coordinator consult.  #8 DVT prophylaxis-on Lovenox  #9 generalized deconditioning-will need physical therapy evaluation and likely placement Social worker consulted    All the records are reviewed and case discussed with ED provider. Management plans discussed with the patient, family and they are in agreement.  CODE STATUS: Full code   TOTAL TIME TAKING CARE OF THIS PATIENT: 50  minutes.    Enid Baas M.D on 07/17/2015 at 5:52 PM  Between 7am to 6pm - Pager - [phone]  After 6pm go to [email or web address] - password [email or web address] [facility]  [facility]  Office  [phone]  CC: Primary care physician; No primary care provider on file.

## 2015-07-17 NOTE — Progress Notes (Signed)
Pharmacy Antibiotic Note  [name] is a 55 y.o. female admitted on 07/17/2015 with UTI.  Pharmacy has been consulted for ceftriaxone dosing.  Plan: Ceftriaxone 1 g IV daily  Height: 5\' 3"  (160 cm) Weight: 165 lb (74.844 kg) IBW/kg (Calculated) : 52.4  Temp (24hrs), Avg:98.6 F (37 C), Min:98.6 F (37 C), Max:98.6 F (37 C)   Recent Labs Lab 07/17/15 1455  WBC 13.1*  CREATININE 1.55*    Estimated Creatinine Clearance: 39.8 mL/min (by C-G formula based on Cr of 1.55).    No Known Allergies  Antimicrobials this admission: ceftriaxone 6/13 >>   Dose adjustments this admission:  Microbiology results: 6/13 UCx: Sent   Thank you for allowing pharmacy to be a part of this patient's care.  Cindi CarbonMary M Dashton Czerwinski, PharmD Clinical Pharmacist 07/17/2015 6:54 PM

## 2015-07-17 NOTE — ED Notes (Signed)
Patients daughter, Harlin HeysCasey Lee, is requesting to have her number left to be contacted for updates. [phone].

## 2015-07-17 NOTE — ED Notes (Signed)
Patient transported to radiology

## 2015-07-17 NOTE — ED Notes (Signed)
Patient turned down to her home level of O2 (3L). SpO2 dropped to 85%. Patient turned up to 6L at this time. SpO2 now 97%.

## 2015-07-17 NOTE — ED Notes (Signed)
Attempted to call report x2

## 2015-07-17 NOTE — ED Notes (Signed)
Attempted to call report x 1  

## 2015-07-17 NOTE — ED Provider Notes (Signed)
Time Seen: Approximately 1500 I have reviewed the triage notes  Chief Complaint: Back Pain   History of Present Illness: [name] is a 55 y.o. female who presents with a history of chronic back pain and some anxiety. The patient has had her son passed away recently from an overdose and states that she's been neglectful of her medications over the last several days. She is presenting with back pain she states that she's had before. She has not taken anything at home prior to arrival. Here in emergency department she was found to be hypoxic though she is on 3 L nasal cannula at home. She states she has had a breathing treatments and she's been here and is feeling slightly improved. She denies any fever or productive cough. She states that she does have her prescriptions waiting for her at the pharmacy but has not been able to pick them up at this point doing with the grief of losing her son.*   Past Medical History  Diagnosis Date  . Diabetes mellitus without complication (HCC)   . Hypertension   . COPD (chronic obstructive pulmonary disease) (HCC)   . Asthma   . Hyperlipidemia   . Insomnia   . Coarse tremors   . Anxiety     There are no active problems to display for this patient.   History reviewed. No pertinent past surgical history.  History reviewed. No pertinent past surgical history.  Current Outpatient Rx  Name  Route  Sig  Dispense  Refill  . albuterol (ACCUNEB) 0.63 MG/3ML nebulizer solution   Nebulization   Take 1 ampule by nebulization every 6 (six) hours as needed for wheezing.         Marland Kitchen ALPRAZolam (XANAX) 1 MG tablet   Oral   Take 1 mg by mouth 3 (three) times daily as needed for anxiety. Take one tablet 3 time a day and one tablet at bedtime as needed         . amitriptyline (ELAVIL) 10 MG tablet   Oral   Take 10 mg by mouth 3 (three) times daily as needed for sleep.         Marland Kitchen atorvastatin (LIPITOR) 20 MG tablet   Oral   Take 40 mg by mouth  daily.         Marland Kitchen azithromycin (ZITHROMAX) 250 MG tablet      Take 1 tablet PO daily starting on 09/13/14 for 4 days   4 each   0   . diazepam (VALIUM) 5 MG tablet   Oral   Take 5 mg by mouth 3 (three) times daily as needed for anxiety. Take one tablet 3 times a day and at bedtime as needed.         . Fluticasone-Salmeterol (ADVAIR) 250-50 MCG/DOSE AEPB   Inhalation   Inhale 1 puff into the lungs 2 (two) times daily.         Marland Kitchen gabapentin (NEURONTIN) 300 MG capsule   Oral   Take 300-600 mg by mouth 3 (three) times daily.         . insulin aspart (NOVOLOG) 100 UNIT/ML injection   Subcutaneous   Inject 21 Units into the skin 3 (three) times daily.         . insulin glargine (LANTUS) 100 UNIT/ML injection   Subcutaneous   Inject 72 Units into the skin at bedtime.         Marland Kitchen lisinopril (PRINIVIL,ZESTRIL) 2.5 MG tablet   Oral  Take 2.5 mg by mouth daily.         . metFORMIN (GLUCOPHAGE) 500 MG tablet   Oral   Take 500 mg by mouth 2 (two) times daily.         . mirtazapine (REMERON) 15 MG tablet   Oral   Take 15 mg by mouth at bedtime.         Marland Kitchen oxyCODONE-acetaminophen (PERCOCET/ROXICET) 5-325 MG per tablet   Oral   Take 1 tablet by mouth every 6 (six) hours as needed for severe pain. Take one tablet by mouth every 6-8 hours and one tablet at bedtime as needed.         . predniSONE (DELTASONE) 20 MG tablet   Oral   Take 3 tablets (60 mg total) by mouth daily.   15 tablet   0   . traZODone (DESYREL) 100 MG tablet   Oral   Take 100-200 mg by mouth at bedtime as needed for sleep.         Marland Kitchen zolpidem (AMBIEN) 10 MG tablet   Oral   Take 10 mg by mouth at bedtime as needed for sleep.           Allergies:  Review of patient's allergies indicates no known allergies.  Family History: No family history on file.  Social History: Social History  Substance Use Topics  . Smoking status: Current Every Day Smoker -- 0.50 packs/day    Types:  Cigarettes  . Smokeless tobacco: None  . Alcohol Use: No     Review of Systems:   10 point review of systems was performed and was otherwise negative:  Constitutional: No fever Eyes: No visual disturbances ENT: No sore throat, ear pain Cardiac: No chest pain Respiratory: Positive for shortness of breath no wheezing or stridor Abdomen: No abdominal pain, no vomiting, No diarrhea Endocrine: No weight loss, No night sweats Extremities: No peripheral edema, cyanosis Skin: No rashes, easy bruising Neurologic: No focal weakness, trouble with speech or swollowing Urologic: No dysuria, Hematuria, or urinary frequency Patient denies any suicidal thoughts, homicidal thoughts, hallucinations  Physical Exam:  ED Triage Vitals  Enc Vitals Group     BP 07/17/15 1422 133/68 mmHg     Pulse Rate 07/17/15 1422 93     Resp 07/17/15 1422 22     Temp 07/17/15 1422 98.6 F (37 C)     Temp Source 07/17/15 1422 Oral     SpO2 07/17/15 1422 94 %     Weight 07/17/15 1422 165 lb (74.844 kg)     Height 07/17/15 1422 5\' 3"  (1.6 m)     Head Cir --      Peak Flow --      Pain Score 07/17/15 1422 7     Pain Loc --      Pain Edu? --      Excl. in GC? --     General: Awake , Alert , and Oriented times 3; GCS 15 Head: Normal cephalic , atraumatic Eyes: Pupils equal , round, reactive to light Nose/Throat: No nasal drainage, patent upper airway without erythema or exudate.  Neck: Supple, Full range of motion, No anterior adenopathy or palpable thyroid masses Lungs: Diminished breath sounds bilaterally at the bases with some mild and expiratory wheezing heard at the apices no rales or rhonchi are noted Heart: Regular rate, regular rhythm without murmurs , gallops , or rubs Abdomen: Soft, non tender without rebound, guarding , or rigidity; bowel sounds positive and symmetric  in all 4 quadrants. No organomegaly .        Extremities: 2 plus symmetric pulses. No edema, clubbing or cyanosis Neurologic:  normal ambulation, Motor symmetric without deficits, sensory intact Skin: warm, dry, no rashes   Labs:   All laboratory work was reviewed including any pertinent negatives or positives listed below:  Labs Reviewed  CBC - Abnormal; Notable for the following:    WBC 13.1 (*)    RBC 3.77 (*)    Hemoglobin 11.1 (*)    HCT 34.0 (*)    All other components within normal limits  COMPREHENSIVE METABOLIC PANEL  TROPONIN I  LIPASE, BLOOD  URINALYSIS COMPLETEWITH MICROSCOPIC ([facility] ONLY)  BRAIN NATRIURETIC PEPTIDE   Review of the patient's laboratory work shows a urinary tract infection and urine culture was added. The patient also appears to have a lot of glucose and her urine. Her white blood cell count slightly elevated and she has some findings consistent with renal insufficiency. EKG: ED ECG REPORT I, Jennye MoccasinBrian S Quigley, the attending physician, personally viewed and interpreted this ECG.  Date: 07/17/2015 EKG Time: 1432 Rate: 92 Rhythm: normal sinus rhythm QRS Axis: normal Intervals: Right bundle-branch block ST/T Wave abnormalities: normal Conduction Disturbances: none Narrative Interpretation: unremarkable No acute ischemic changes  Radiology:   cough  EXAM: CHEST 2 VIEW  COMPARISON: 09/12/2014  FINDINGS: Cardiomediastinal silhouette is stable. Metallic fixation plate cervical spine again noted. No acute infiltrate or pleural effusion. No pulmonary edema. Mild degenerative changes mid and lower thoracic spine.  IMPRESSION: No active cardiopulmonary disease.   Electronically Signed By: Natasha MeadLiviu Pop M.D. On: 07/17/2015 15:28    I personally reviewed the radiologic studies     Critical Care: * CRITICAL CARE Performed by: Jennye MoccasinBrian S Quigley   Total critical care time: 33 minutes  Critical care time was exclusive of separately billable procedures and treating other patients.  Critical care was necessary to treat or prevent imminent or life-threatening  deterioration.  Critical care was time spent personally by me on the following activities: development of treatment plan with patient and/or surrogate as well as nursing, discussions with consultants, evaluation of patient's response to treatment, examination of patient, obtaining history from patient or surrogate, ordering and performing treatments and interventions, ordering and review of laboratory studies, ordering and review of radiographic studies, pulse oximetry and re-evaluation of patient's condition. Patient requires multiple system resuscitation including respiratory, renal, endocrine, etc    ED Course: * Patient urine was cultured and was started on IV antibiotics. Her chest x-ray doesn't show any signs of pneumonia and it appears that she's having exacerbation of her COPD. Patient was given insulin and was started on IV fluids and resuscitation for renal insufficiency. Patient's case was reviewed with the hospitalist team, further disposition and management depends upon their evaluation.   Assessment:  Noncompliance due to grieving Renal insufficiency Urinary tract infection Exacerbation of chronic back pain COPD exacerbation Depression     Plan: Inpatient management           Jennye MoccasinBrian S Quigley, MD 07/17/15 1825

## 2015-07-17 NOTE — ED Notes (Signed)
Patient made aware that we need a urine sample. Patient states she is unable to give one at this time.

## 2015-07-17 NOTE — ED Notes (Signed)
Per [facility] patient comes from home with c/o chronic back pain and anxiety. Patients son passed away here last week due to an overdose. Patient has not had any of her medication today (insulin, xanax, oxycodone or trazadone). CBG 235. Patient c/o 7/10 pain at this time. SpO2 here 79% patient placed on 6L [place], Dr. Cyril LoosenKinner at bedside. Patient up to 96%. Turned down to 4L, tolerating well. Patient denies HI or SI.

## 2015-07-18 ENCOUNTER — Inpatient Hospital Stay: Payer: Medicaid Other

## 2015-07-18 DIAGNOSIS — J9601 Acute respiratory failure with hypoxia: Secondary | ICD-10-CM

## 2015-07-18 DIAGNOSIS — E1165 Type 2 diabetes mellitus with hyperglycemia: Secondary | ICD-10-CM

## 2015-07-18 DIAGNOSIS — J9600 Acute respiratory failure, unspecified whether with hypoxia or hypercapnia: Secondary | ICD-10-CM | POA: Insufficient documentation

## 2015-07-18 DIAGNOSIS — J441 Chronic obstructive pulmonary disease with (acute) exacerbation: Principal | ICD-10-CM

## 2015-07-18 DIAGNOSIS — J9602 Acute respiratory failure with hypercapnia: Secondary | ICD-10-CM

## 2015-07-18 LAB — GLUCOSE, CAPILLARY
GLUCOSE-CAPILLARY: 189 mg/dL — AB (ref 65–99)
GLUCOSE-CAPILLARY: 262 mg/dL — AB (ref 65–99)
Glucose-Capillary: 215 mg/dL — ABNORMAL HIGH (ref 65–99)
Glucose-Capillary: 184 mg/dL — ABNORMAL HIGH (ref 65–99)
Glucose-Capillary: 186 mg/dL — ABNORMAL HIGH (ref 65–99)

## 2015-07-18 LAB — BASIC METABOLIC PANEL
Anion gap: 7 (ref 5–15)
Anion gap: 7 (ref 5–15)
BUN: 34 mg/dL — ABNORMAL HIGH (ref 6–20)
BUN: 32 mg/dL — AB (ref 6–20)
CALCIUM: 8.2 mg/dL — AB (ref 8.9–10.3)
CHLORIDE: 109 mmol/L (ref 101–111)
CO2: 24 mmol/L (ref 22–32)
CO2: 25 mmol/L (ref 22–32)
CREATININE: 1.42 mg/dL — AB (ref 0.44–1.00)
Calcium: 8.1 mg/dL — ABNORMAL LOW (ref 8.9–10.3)
Chloride: 108 mmol/L (ref 101–111)
Creatinine, Ser: 1.51 mg/dL — ABNORMAL HIGH (ref 0.44–1.00)
GFR calc non Af Amer: 41 mL/min — ABNORMAL LOW (ref >60)
GFR, EST AFRICAN AMERICAN: 44 mL/min — AB (ref >60)
GFR, EST AFRICAN AMERICAN: 47 mL/min — AB (ref >60)
GFR, EST NON AFRICAN AMERICAN: 38 mL/min — AB (ref >60)
Glucose, Bld: 207 mg/dL — ABNORMAL HIGH (ref 65–99)
Glucose, Bld: 174 mg/dL — ABNORMAL HIGH (ref 65–99)
POTASSIUM: 5.4 mmol/L — AB (ref 3.5–5.1)
Potassium: 3.7 mmol/L (ref 3.5–5.1)
Sodium: 139 mmol/L (ref 135–145)
Sodium: 141 mmol/L (ref 135–145)

## 2015-07-18 LAB — CBC
HEMATOCRIT: 31.8 % — AB (ref 35.0–47.0)
HEMOGLOBIN: 10.5 g/dL — AB (ref 12.0–16.0)
MCH: 30.2 pg (ref 26.0–34.0)
MCHC: 32.9 g/dL (ref 32.0–36.0)
MCV: 91.9 fL (ref 80.0–100.0)
Platelets: 163 10*3/uL (ref 150–440)
RBC: 3.46 MIL/uL — ABNORMAL LOW (ref 3.80–5.20)
RDW: 14.5 % (ref 11.5–14.5)
WBC: 12.6 10*3/uL — AB (ref 3.6–11.0)

## 2015-07-18 LAB — MRSA PCR SCREENING: MRSA by PCR: NEGATIVE

## 2015-07-18 LAB — HEMOGLOBIN A1C: HEMOGLOBIN A1C: 10.8 % — AB (ref 4.0–6.0)

## 2015-07-18 MED ORDER — IPRATROPIUM-ALBUTEROL 0.5-2.5 (3) MG/3ML IN SOLN
3.0000 mL | Freq: Four times a day (QID) | RESPIRATORY_TRACT | Status: DC
  Administered 2015-07-18 – 2015-07-22 (×16): 3 mL via RESPIRATORY_TRACT
  Filled 2015-07-18 (×16): qty 3

## 2015-07-18 MED ORDER — ATORVASTATIN CALCIUM 20 MG PO TABS
40.0000 mg | ORAL_TABLET | Freq: Every day | ORAL | Status: DC
  Administered 2015-07-18 – 2015-07-20 (×3): 40 mg
  Filled 2015-07-18 (×3): qty 2

## 2015-07-18 MED ORDER — PROPOFOL 1000 MG/100ML IV EMUL
5.0000 ug/kg/min | INTRAVENOUS | Status: DC
  Administered 2015-07-18: 30 ug/kg/min via INTRAVENOUS
  Administered 2015-07-18: 15 ug/kg/min via INTRAVENOUS
  Administered 2015-07-19: 60 ug/kg/min via INTRAVENOUS
  Administered 2015-07-19: 30 ug/kg/min via INTRAVENOUS
  Filled 2015-07-18 (×10): qty 100

## 2015-07-18 MED ORDER — PRO-STAT SUGAR FREE PO LIQD
30.0000 mL | Freq: Every day | ORAL | Status: DC
  Administered 2015-07-19: 30 mL

## 2015-07-18 MED ORDER — HYDRALAZINE HCL 20 MG/ML IJ SOLN
10.0000 mg | INTRAMUSCULAR | Status: DC | PRN
  Administered 2015-07-18 – 2015-07-19 (×2): 20 mg via INTRAVENOUS
  Administered 2015-07-20 (×2): 10 mg via INTRAVENOUS
  Administered 2015-07-20: 20 mg via INTRAVENOUS
  Administered 2015-07-20: 10 mg via INTRAVENOUS
  Administered 2015-07-21: 20 mg via INTRAVENOUS
  Administered 2015-07-21: 10 mg via INTRAVENOUS
  Filled 2015-07-18 (×7): qty 1
  Filled 2015-07-18: qty 2

## 2015-07-18 MED ORDER — SODIUM CHLORIDE 0.9 % IV BOLUS (SEPSIS)
250.0000 mL | Freq: Once | INTRAVENOUS | Status: AC
  Administered 2015-07-18: 250 mL via INTRAVENOUS

## 2015-07-18 MED ORDER — POLYETHYLENE GLYCOL 3350 17 G PO PACK
17.0000 g | PACK | Freq: Every day | ORAL | Status: DC | PRN
Start: 2015-07-18 — End: 2015-07-23

## 2015-07-18 MED ORDER — BUDESONIDE 0.25 MG/2ML IN SUSP
0.2500 mg | Freq: Four times a day (QID) | RESPIRATORY_TRACT | Status: DC
  Administered 2015-07-18 – 2015-07-22 (×17): 0.25 mg via RESPIRATORY_TRACT
  Filled 2015-07-18 (×17): qty 2

## 2015-07-18 MED ORDER — GABAPENTIN 300 MG PO CAPS
300.0000 mg | ORAL_CAPSULE | Freq: Three times a day (TID) | ORAL | Status: DC
  Administered 2015-07-18 – 2015-07-20 (×7): 300 mg
  Filled 2015-07-18 (×7): qty 1

## 2015-07-18 MED ORDER — METOPROLOL TARTRATE 5 MG/5ML IV SOLN
2.5000 mg | INTRAVENOUS | Status: DC | PRN
  Administered 2015-07-18 – 2015-07-21 (×3): 5 mg via INTRAVENOUS
  Filled 2015-07-18 (×4): qty 5

## 2015-07-18 MED ORDER — ROCURONIUM BROMIDE 50 MG/5ML IV SOLN
INTRAVENOUS | Status: AC
  Filled 2015-07-18: qty 1

## 2015-07-18 MED ORDER — VITAL HIGH PROTEIN PO LIQD
1000.0000 mL | ORAL | Status: DC
  Administered 2015-07-19: 1000 mL

## 2015-07-18 MED ORDER — VITAL HIGH PROTEIN PO LIQD
1000.0000 mL | ORAL | Status: DC
  Administered 2015-07-18: 1000 mL

## 2015-07-18 MED ORDER — ETOMIDATE 2 MG/ML IV SOLN
20.0000 mg | Freq: Once | INTRAVENOUS | Status: AC
  Administered 2015-07-18: 20 mg via INTRAVENOUS
  Filled 2015-07-18: qty 10

## 2015-07-18 MED ORDER — ALBUTEROL SULFATE (2.5 MG/3ML) 0.083% IN NEBU: 2.5000 mg | INHALATION_SOLUTION | RESPIRATORY_TRACT | Status: DC | PRN

## 2015-07-18 MED ORDER — FENTANYL CITRATE (PF) 100 MCG/2ML IJ SOLN
100.0000 ug | INTRAMUSCULAR | Status: AC | PRN
  Administered 2015-07-18 (×3): 100 ug via INTRAVENOUS
  Filled 2015-07-18 (×3): qty 2

## 2015-07-18 MED ORDER — ROCURONIUM BROMIDE 50 MG/5ML IV SOLN
50.0000 mg | Freq: Once | INTRAVENOUS | Status: AC
  Administered 2015-07-18: 50 mg via INTRAVENOUS

## 2015-07-18 MED ORDER — ENOXAPARIN SODIUM 40 MG/0.4ML ~~LOC~~ SOLN: 40.0000 mg | SUBCUTANEOUS | Status: DC

## 2015-07-18 MED ORDER — INSULIN ASPART 100 UNIT/ML ~~LOC~~ SOLN
0.0000 [IU] | SUBCUTANEOUS | Status: DC
  Administered 2015-07-18 (×2): 4 [IU] via SUBCUTANEOUS
  Administered 2015-07-18: 11 [IU] via SUBCUTANEOUS
  Administered 2015-07-19 (×2): 20 [IU] via SUBCUTANEOUS
  Administered 2015-07-19: 15 [IU] via SUBCUTANEOUS
  Administered 2015-07-19: 11 [IU] via SUBCUTANEOUS
  Filled 2015-07-18: qty 4
  Filled 2015-07-18: qty 15
  Filled 2015-07-18: qty 4
  Filled 2015-07-18: qty 11
  Filled 2015-07-18: qty 20
  Filled 2015-07-18: qty 11
  Filled 2015-07-18: qty 20

## 2015-07-18 MED ORDER — CHLORHEXIDINE GLUCONATE 0.12% ORAL RINSE (MEDLINE KIT)
15.0000 mL | Freq: Two times a day (BID) | OROMUCOSAL | Status: DC
  Administered 2015-07-18 – 2015-07-19 (×4): 15 mL via OROMUCOSAL
  Filled 2015-07-18 (×4): qty 15

## 2015-07-18 MED ORDER — MIDAZOLAM HCL 2 MG/2ML IJ SOLN
2.0000 mg | INTRAMUSCULAR | Status: DC | PRN
  Administered 2015-07-18 – 2015-07-20 (×4): 2 mg via INTRAVENOUS
  Filled 2015-07-18 (×5): qty 2

## 2015-07-18 MED ORDER — PRO-STAT SUGAR FREE PO LIQD
30.0000 mL | Freq: Two times a day (BID) | ORAL | Status: DC
  Administered 2015-07-18: 30 mL

## 2015-07-18 MED ORDER — METHYLPREDNISOLONE SODIUM SUCC 40 MG IJ SOLR
40.0000 mg | Freq: Two times a day (BID) | INTRAMUSCULAR | Status: DC
  Administered 2015-07-18 (×2): 40 mg via INTRAVENOUS
  Filled 2015-07-18 (×2): qty 1

## 2015-07-18 MED ORDER — DEXTROSE 5 % IV SOLN
1.0000 g | INTRAVENOUS | Status: AC
  Administered 2015-07-18 – 2015-07-20 (×3): 1 g via INTRAVENOUS
  Filled 2015-07-18 (×4): qty 10

## 2015-07-18 MED ORDER — PROPOFOL 1000 MG/100ML IV EMUL
INTRAVENOUS | Status: AC
  Filled 2015-07-18: qty 100

## 2015-07-18 MED ORDER — PANTOPRAZOLE SODIUM 40 MG PO PACK
40.0000 mg | PACK | Freq: Every day | ORAL | Status: DC
  Administered 2015-07-18: 40 mg
  Filled 2015-07-18: qty 20

## 2015-07-18 MED ORDER — AMITRIPTYLINE HCL 50 MG PO TABS
50.0000 mg | ORAL_TABLET | Freq: Every day | ORAL | Status: DC
  Administered 2015-07-18 – 2015-07-19 (×2): 50 mg
  Filled 2015-07-18 (×3): qty 1

## 2015-07-18 MED ORDER — ANTISEPTIC ORAL RINSE SOLUTION (CORINZ)
7.0000 mL | Freq: Four times a day (QID) | OROMUCOSAL | Status: DC
  Administered 2015-07-18 – 2015-07-19 (×6): 7 mL via OROMUCOSAL
  Filled 2015-07-18 (×8): qty 7

## 2015-07-18 MED ORDER — FREE WATER
200.0000 mL | Freq: Three times a day (TID) | Status: DC
  Administered 2015-07-18 – 2015-07-20 (×7): 200 mL

## 2015-07-18 MED ORDER — FENTANYL CITRATE (PF) 100 MCG/2ML IJ SOLN
INTRAMUSCULAR | Status: AC
  Administered 2015-07-18: 100 ug via INTRAVENOUS
  Filled 2015-07-18: qty 2

## 2015-07-18 MED ORDER — INSULIN GLARGINE 100 UNIT/ML ~~LOC~~ SOLN
35.0000 [IU] | Freq: Every day | SUBCUTANEOUS | Status: DC
  Administered 2015-07-18: 35 [IU] via SUBCUTANEOUS
  Filled 2015-07-18 (×2): qty 0.35

## 2015-07-18 MED ORDER — MIDAZOLAM HCL 2 MG/2ML IJ SOLN
2.0000 mg | INTRAMUSCULAR | Status: AC | PRN
  Administered 2015-07-18 (×3): 2 mg via INTRAVENOUS
  Filled 2015-07-18 (×2): qty 2

## 2015-07-18 MED ORDER — FENTANYL CITRATE (PF) 100 MCG/2ML IJ SOLN
50.0000 ug | INTRAMUSCULAR | Status: DC | PRN
  Administered 2015-07-18 – 2015-07-19 (×6): 50 ug via INTRAVENOUS
  Filled 2015-07-18 (×4): qty 2

## 2015-07-18 NOTE — Progress Notes (Signed)
Pharmacy Antibiotic Note  Marlene BastDeborah W Bodkins is a 55 y.o. female admitted on 07/17/2015 with UTI.  Pharmacy has been consulted for ceftriaxone dosing.  Plan: Will continue ceftriaxone 1g IV Q24hr.    Height: 5\' 2"  (157.5 cm) Weight: 177 lb 7.5 oz (80.5 kg) IBW/kg (Calculated) : 50.1  Temp (24hrs), Avg:98.5 F (36.9 C), Min:97.6 F (36.4 C), Max:99.3 F (37.4 C)   Recent Labs Lab 07/17/15 1455 07/18/15 0553 07/18/15 1345  WBC 13.1* 12.6*  --   CREATININE 1.55* 1.51* 1.42*    Estimated Creatinine Clearance: 44 mL/min (by C-G formula based on Cr of 1.42).    No Known Allergies  Antimicrobials this admission: ceftriaxone 6/13 >>   Dose adjustments this admission:  Microbiology results: 6/13 UCx: Sent  6/14 Sputum: pending  6/14 MRSA PCR: negative   Pharmacy will continue to monitor and adjust per consult.    Bertram SavinSimpson,Michael L, PharmD Clinical Pharmacist 07/18/2015 2:18 PM

## 2015-07-18 NOTE — Procedures (Signed)
PROCEDURE NOTE: L [place] VEIN CVL PLACEMENT  INDICATION:    Monitoring of central venous pressures and/or administration of medications optimally administered in central vein  CONSENT:   Risks of procedure as well as the alternatives were explained to the patient or surrogate. Consent for procedure obtained. A time out was performed.   PROCEDURE  Sterile technique was used including antiseptics, cap, gloves, gown, hand hygiene, mask and full body sheet.  Skin prep: Chlorhexidine; local anesthetic administered  A triple lumen catheter was placed in the L [place] vein using the Seldinger technique.   EVALUATION:  Blood flow good  Complications: No apparent complications  Patient tolerated the procedure well.  Chest X-ray revealed proper position and no PTX   Billy Fischeravid Kataleya Zaugg, MD PCCM service Mobile [phone] Pager [phone] 07/18/2015

## 2015-07-18 NOTE — Progress Notes (Signed)
Called to bedside to assess breath sounds. Nursing reports that the patient will awaken to voice but cannot stay awake. My evaluation significant for significant upper airway congestion. The patient is not clearing her secretions very well presumably because she is so somnolent. I ordered an ABG which showed pH 7.2/54/59 on 2 L of oxygen via nasal cannula. Discussed starting BiPAP. Will transfer patient to stepdown.

## 2015-07-18 NOTE — Care Management (Signed)
Consult for CM present due to patient request to go to assisted living environment.  Is on chronic 02 at home.  Her son died last week of an overdose and patient admits to not taking her medications. She has chronic back pain and diabetic neuropathy.  Increasing difficulty with ambulation.  She presented to the ED with weakness and back pain.   During the course of the ED visit found to be hypoxic and requiring increase in her 02 liter flow from 3 liters to 6.  she was admitted to 1C then became somnolent and required transfer to icu stepdown due to need for continuous bipap.  Patient is not abe to participate in a conversation at present time.   There is a CSW consult present

## 2015-07-18 NOTE — Progress Notes (Signed)
Patient seen earlier in the day.  Admitted yesterday for back pain and COPD exacerbation along with UTI. Patient was more confused and drowsy overnight. ABG was checked and was found to have respiratory acidosis and placed on BiPAP. Today she continues to be extremely drowsy and no improvement on the pH or PCO2 on ABG in spite of being on BiPAP.  Drowsy. Decreased air entry on both sides with some wheezing S1 and S2  Review of systems cannot be obtained due to patient's drowsiness.  * Acute on chronic respiratory failure due to COPD exacerbation with acute encephalopathy Patient is on steroids and antibiotics along with nebulizer therapy. No improvement with BiPAP. Will need intubation. Consult pulmonary.  * UTI On ceftriaxone. Cultures pending.  * Chronic low back pain Limit pain medications due to drowsiness.  * DVT prophylaxis  Total CC time spent 35 minutes   Later case discussed with Dr. Sharol HarnessSimmons. Patient is being intubated. We will transfer service to pulmonary critical care medicine.

## 2015-07-18 NOTE — Clinical Social Work Note (Signed)
CSW consult for assistance with placement. RN CM states that patient is on bipap and that patient is not able to participate in conversation today. Patient has not been assessed by PT at this time and thus it is uncertain if she will qualify for placement. Furthermore, patient has medicaid only and unfortunately [place] of [place]. It was mentioned that patient might need assisted living level of care which medicaid would pay for but those beds are not as abundant either. CSW concerned that placing patient in a facility will not be helpful during her grieving process for her son. CSW will continue to follow for discharge needs and will assist where needed. York SpanielMonica Kari Montero MSW,LCSW [phone]

## 2015-07-18 NOTE — Progress Notes (Signed)
Initial Nutrition Assessment  DOCUMENTATION CODES:   Obesity unspecified  INTERVENTION:  -TF: Dietitian Consult received via Adult Tube Feeding Protocol; recommend starting Vital High Protein at goal of 45 ml/hr, PEPuP protocol initiated. Recommend Prostat daily. Provides total of 1180 kcals, 110 g of protein and 1507 mL of free water (includes scheduled free water flushes of 200 mL q 8 hours). Continue to assess   NUTRITION DIAGNOSIS:   Inadequate oral intake related to acute illness as evidenced by NPO status.  GOAL:   Provide needs based on ASPEN/SCCM guidelines   MONITOR:   Vent status, Labs, Weight trends, TF tolerance  REASON FOR ASSESSMENT:   Consult, Ventilator Enteral/tube feeding initiation and management  ASSESSMENT:   55 yo female admitted with acute respiratory failure with COPD exacerbation, failed BiPap intervention requiring intubation on 07/18/15  Pt currently sedated on vent, OG tube in place with tip in stomach  Past Medical History  Diagnosis Date  . 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 Hypertension   . COPD (chronic obstructive pulmonary disease) (HCC)     on 3L home o2  . Asthma   . Hyperlipidemia   . Insomnia   . Coarse tremors   . Anxiety     Diet Order:  Diet NPO time specified  Skin:  Reviewed, no issues  Last BM:  07/13/15   Labs: potassium 5.4, Creatinine 1.51, sodium wdl  Lab Results  Component Value Date   HGBA1C 10.8* 07/17/2015   Meds: reviewed  Height:   Ht Readings from Last 1 Encounters:  07/18/15 5\' 2"  (1.575 m)    Weight:   Wt Readings from Last 1 Encounters:  07/18/15 177 lb 7.5 oz (80.5 kg)    Ideal Body Weight:  50 kg  BMI:  Body mass index is 32.45 kg/(m^2).  Estimated Nutritional Needs:   Kcal:  [phone] kcals   Protein:  >/= 100 g  Fluid:  >/= 1.5 L  EDUCATION NEEDS:   No education needs identified at this time  Romelle StarcherCate San Rua MS, RD, LDN [phone]  Pager  [phone] Weekend/On-Call Pager

## 2015-07-18 NOTE — Progress Notes (Signed)
Chaplain received a page from the staff that daughter was in a bad mental state, seemed faint. Chaplain responded to page and spent time with the patient, health care provider (nurse in the room) talking about the assistance we offered and checking on the state of the daughter. Daughter [name](Cassie) still seems out of it for a lack of better words. The news and sight of her mother in such a depleted state seems to have taken control of her emotions and left emotionless and numb. Chaplain team will return in less than a hour to check on her well-being again.

## 2015-07-18 NOTE — Consult Note (Signed)
PULMONARY / CRITICAL CARE MEDICINE   Name: TANISHI NAULT MRN: [id number] DOB: October 29, 1960    ADMISSION DATE:  07/17/2015 CONSULTATION DATE:  07/18/15  REFERRING MD:  Nemiah Commander  CHIEF COMPLAINT:  COPD Exacerbation  HISTORY OF PRESENT ILLNESS:   Kortny, Lirette is a 55 year old female with known history history of diabetes mellitus, diabetic neuropathy, hypertension, chronic low back pain, multiple back surgeries in the past, COPD, uses 3 L of oxygen at home, tobacco abuse. Patient presented to [facility] ED with chronic back pain and confusion. The patient has had her son passed away recently from an overdose and states that she has been neglectful of her medication over the last several days. Patient was hypoxic in the ER. Patient was started on antibiotics for UTI. Patient was admitted to 1c then became more somnolent and was transferred to the ICU on BiPAP. Patient continued to be very somnolent therefore decided to intubate the patient for airway protection. [facility] M team took over the care.  PAST MEDICAL HISTORY :  She  has a past medical history of Diabetes mellitus without complication (HCC); Hypertension; COPD (chronic obstructive pulmonary disease) (HCC); Asthma; Hyperlipidemia; Insomnia; Coarse tremors; and Anxiety.  PAST SURGICAL HISTORY: She  has past surgical history that includes Back surgery; Foot surgery; Bronchoscopy; Cervical spine surgery; and Cholecystectomy.  No Known Allergies  No current facility-administered medications on file prior to encounter.   Current Outpatient Prescriptions on File Prior to Encounter  Medication Sig  . ALPRAZolam (XANAX) 1 MG tablet Take 1 mg by mouth 4 (four) times daily as needed for anxiety.   . Fluticasone-Salmeterol (ADVAIR) 250-50 MCG/DOSE AEPB Inhale 1 puff into the lungs 2 (two) times daily.  Marland Kitchen gabapentin (NEURONTIN) 300 MG capsule Take 1,200 mg by mouth 3 (three) times daily.   Marland Kitchen lisinopril (PRINIVIL,ZESTRIL) 2.5  MG tablet Take 2.5 mg by mouth daily.  . traZODone (DESYREL) 100 MG tablet Take 200 mg by mouth at bedtime.     FAMILY HISTORY:  Her indicated that her mother is deceased. She indicated that her father is deceased.   SOCIAL HISTORY: She  reports that she has been smoking Cigarettes.  She has been smoking about 0.50 packs per day. She does not have any smokeless tobacco history on file. She reports that she does not drink alcohol or use illicit drugs.  REVIEW OF SYSTEMS:   Unable to obtain  SUBJECTIVE:  Unable to obtain as the patient is intubated on and on ventilator.  VITAL SIGNS: BP 112/58 mmHg  Pulse 92  Temp(Src) 97.6 F (36.4 C) (Axillary)  Resp 14  Ht 5\' 2"  (1.575 m)  Wt 177 lb 7.5 oz (80.5 kg)  BMI 32.45 kg/m2  SpO2 99%  HEMODYNAMICS:    VENTILATOR SETTINGS: Vent Mode:  [-] PRVC FiO2 (%):  [30 %-80 %] 30 % Set Rate:  [14 bmp] 14 bmp Vt Set:  [500 mL] 500 mL PEEP:  [5 cmH20] 5 cmH20 Plateau Pressure:  [21 cmH20] 21 cmH20  INTAKE / OUTPUT: I/O last 3 completed shifts: In: 540 [I.V.:540] Out: -   PHYSICAL EXAMINATION: General:  White female, mechanically intubated and vented  Neuro: Lightly sedated HEENT: Atraumatic, normocephalic, PERRLA, no discharge Cardiovascular: S1 and S2, regular rate and rhythm, no murmur rubs or gallop noted Lungs: Clear bilaterally, no wheezes, crackles, rhonchi noted Abdomen: Soft, nondistended, active bowel sounds Musculoskeletal:  No inflammation/deformity noted Skin: Grossly intact  LABS:  BMET  Recent Labs Lab 07/17/15 1455 07/18/15  0553  NA 134* 139  K 3.3* 5.4*  CL 98* 108  CO2 24 24  BUN 36* 34*  CREATININE 1.55* 1.51*  GLUCOSE 270* 207*    Electrolytes  Recent Labs Lab 07/17/15 1455 07/18/15 0553  CALCIUM 8.8* 8.2*    CBC  Recent Labs Lab 07/17/15 1455 07/18/15 0553  WBC 13.1* 12.6*  HGB 11.1* 10.5*  HCT 34.0* 31.8*  PLT 179 163    Coag's No results for input(s): APTT, INR in the last  168 hours.  Sepsis Markers No results for input(s): LATICACIDVEN, PROCALCITON, O2SATVEN in the last 168 hours.  ABG  Recent Labs Lab 07/18/15 0105 07/18/15 0700  PHART 7.28* 7.21*  PCO2ART 57* 68*  PO2ART 59* 68*    Liver Enzymes  Recent Labs Lab 07/17/15 1455  AST 15  ALT 10*  ALKPHOS 94  BILITOT 0.8  ALBUMIN 2.9*    Cardiac Enzymes  Recent Labs Lab 07/17/15 1455  TROPONINI <0.03    Glucose  Recent Labs Lab 07/17/15 2130 07/18/15 0731 07/18/15 1051  GLUCAP 305* 184* 189*    Imaging Dg Chest 2 View  07/17/2015  CLINICAL DATA:  Cough, green sputum for 1 week, shortness of Breath cough EXAM: CHEST  2 VIEW COMPARISON:  09/12/2014 FINDINGS: Cardiomediastinal silhouette is stable. Metallic fixation plate cervical spine again noted. No acute infiltrate or pleural effusion. No pulmonary edema. Mild degenerative changes mid and lower thoracic spine. IMPRESSION: No active cardiopulmonary disease. Electronically Signed   By: Natasha Mead M.D.   On: 07/17/2015 15:28   Dg Abd 1 View  07/18/2015  CLINICAL DATA:  Orogastric tube placement. EXAM: ABDOMEN - 1 VIEW COMPARISON:  None. FINDINGS: The bowel gas pattern is normal. No radio-opaque calculi or other significant radiographic abnormality are seen. Distal tip of orogastric tube is seen in expected position of distal stomach. Status post surgical posterior fusion of lower lumbar spine. IMPRESSION: Distal tip of orogastric tube is seen in expected position of distal stomach. No evidence of bowel obstruction or ileus is noted. Electronically Signed   By: Lupita Raider, M.D.   On: 07/18/2015 09:41   Portable Chest Xray  07/18/2015  CLINICAL DATA:  Central line placement EXAM: PORTABLE CHEST 1 VIEW COMPARISON:  Yesterday FINDINGS: Left subclavian central line with tip at the upper SVC. No evidence of pneumothorax. Endotracheal tube tip between the clavicular heads and carina. An orogastric tube reaches the stomach. New bandlike  opacity at the right base is likely atelectasis. There is associated volume loss. Normal heart size and mediastinal contours. IMPRESSION: 1. New endotracheal and orogastric tubes in unremarkable position. 2. Left subclavian central line with tip at the upper SVC. No evidence of pneumothorax. 3. New right basilar atelectasis. Electronically Signed   By: Marnee Spring M.D.   On: 07/18/2015 09:43     STUDIES:  None  CULTURES: 6/14 sputum culture>>  ANTIBIOTICS: 6/13 ceftriaxone>>  SIGNIFICANT EVENTS: 6/13> patient came to the ED with hypoxia was placed on BiPAP 6/14>> patient intubated  LINES/TUBES: 6/14 ET tube>> 6/14 left subclavian TLC  DISCUSSION: 55 year old female with hypertension, diabetes, COPD, chronic back pain, anxiety was hypoxic. Initially  placed on BiPAP,. Patient very somnolent and required intubation and now mechanically ventilated  ASSESSMENT / PLAN:  PULMONARY A: Acute hypoxemic/hypercarbic respiratory failure COPD exacerbation  tobacco abuse  P:   On full vent support Wean as tolerated Spontaneous breathing trials  Routine ABGs Continue bronchodilators  Continue Methylprednisone CXR in a.m.  Propofol/fentanyl  for sedation  CARDIOVASCULAR A Hx of hypertension  Hx of hyperlipidemia  P continuous telemetry  Hold lisinopril home management Map goals greater than 65   RENAL A:   Urinary tract infection  Hyperkalemia  Acute kidney injury  P:   Continue ceftriaxone   follow chemistry Replace electrolytes per ICU protocol   GASTROINTESTINAL A:   No active issues P On tube feedings Protonix for GI prophylaxis  HEMATOLOGICAL A No active issues P:  Transfuse if Hgb <7 per ICU protocol   SCDs for DVT prophylaxis Lovenox for DVT prophylaxis  INFECTIOUS A:   Urinary tract infection Leukocytosis P:   Continue ceftriaxone   CBC in a.m. Follow cultures  ENDOCRINE A:   Diabetes mellitus  P:   Blood sugar checks every 4 hours    sliding scale insulin coverage Lantus at bedtime  NEUROLOGIC A:   History of anxiety/depression Chronic pain P:   RASS goal: 0 - -1 Continue amitriptyline   rest per primary    Bincy Varughese,AG-ACNP Pulmonary and Critical Care Medicine [facility] Pager: [phone]  07/18/2015, 12:15 PM   PCCM ATTENDING ATTESTATION:  I have evaluated patient with ACNP Varughese, reviewed database in its entirety and discussed care plan in detail. In addition, this patient was discussed on multidisciplinary rounds.   Important exam findings: Somnolent Diffuse coarse wheezes and rhonchi DM2 with severe hyperglycemia  Major problems addressed by PCCM team: Acute on chronic hypercapnic respiratory failure AECOPD UTI Nonoliguric AKI   PLAN/REC: Vent settings established Vent bundle implemented Daily SBT as indicated Systemic steroids Nebulized steroids and bronchodilators Empiric antibiotics Resp culture PAD protocol Daily WUA   CCM time: 35 mins The above time includes time spent in consultation with patient and/or family members and reviewing care plan on multidisciplinary rounds  Billy Fischeravid Montray Kliebert, MD PCCM service Mobile [phone] Pager [phone]

## 2015-07-18 NOTE — Plan of Care (Signed)
Problem: Activity: Goal: Ability to tolerate increased activity will improve Outcome: Progressing Bedrest maintained. Continuous lateral rotation. PROM.  Pt moves all extremities  But does not follow commands  Problem: Coping: Goal: Level of anxiety will decrease Outcome: Progressing Anxiety improved with propofol.  Anxious with suctioning, oral care , repositioning  Problem: Nutritional: Goal: Intake of prescribed amount of daily calories will improve Outcome: Progressing OGT was placed and TF started at 3840ml/hr. Prostat and h2o flushes as scheduled.  Bowel sounds hypoactive. Abdomen soft  Problem: Respiratory: Goal: Ability to maintain a clear airway and adequate ventilation will improve Outcome: Progressing Able to wean to 30% fiO2.  Secretions are pale yellow. Copius amounts when first intubated.  Secretions decreasing now.  Lungs sound less rhonchorus. VAP protocol initiated  Problem: Skin Integrity Impairment Risk: Goal: Skin integrity will improve Outcome: Progressing Airflow rotation mattress. Repositoining q 2 hr. Pink foam to sacrum.

## 2015-07-18 NOTE — Progress Notes (Signed)
[facility]

## 2015-07-18 NOTE — Procedures (Signed)
Oral Intubation Procedure Note  Indications: Respiratory insufficiency Consent: Unable to obtain consent because of altered level of consciousness. Time Out: Verified patient identification, verified procedure, site/side was marked, verified correct patient position, special equipment/implants available, medications/allergies/relevent history reviewed, required imaging and test results available.   Pre-meds: Etomidate 20 mg IV  Neuromuscular blockade: Rocuronium 50 mg IV  Laryngoscope: #3 MAC  Visualization: cords fully visualized  ETT: 8.0 ETT passed on first attempt and secured @ 22 cm at upper gums  Findings: normal upper airway   Evaluation:  Tube position confirmed by auscultation and EZCap CXR revealed proper position  Pt tolerated procedure well without complications   Billy Fischeravid Venissa Nappi, MD PCCM service Mobile [phone] Pager [phone] 07/18/2015

## 2015-07-19 ENCOUNTER — Inpatient Hospital Stay: Payer: Medicaid Other

## 2015-07-19 DIAGNOSIS — J9622 Acute and chronic respiratory failure with hypercapnia: Secondary | ICD-10-CM

## 2015-07-19 DIAGNOSIS — J9621 Acute and chronic respiratory failure with hypoxia: Secondary | ICD-10-CM

## 2015-07-19 LAB — GLUCOSE, CAPILLARY
GLUCOSE-CAPILLARY: 274 mg/dL — AB (ref 65–99)
GLUCOSE-CAPILLARY: 275 mg/dL — AB (ref 65–99)
GLUCOSE-CAPILLARY: 292 mg/dL — AB (ref 65–99)
GLUCOSE-CAPILLARY: 338 mg/dL — AB (ref 65–99)
GLUCOSE-CAPILLARY: 368 mg/dL — AB (ref 65–99)
GLUCOSE-CAPILLARY: 378 mg/dL — AB (ref 65–99)
Glucose-Capillary: 352 mg/dL — ABNORMAL HIGH (ref 65–99)
Glucose-Capillary: 369 mg/dL — ABNORMAL HIGH (ref 65–99)
Glucose-Capillary: 401 mg/dL — ABNORMAL HIGH (ref 65–99)
Glucose-Capillary: 409 mg/dL — ABNORMAL HIGH (ref 65–99)
Glucose-Capillary: 399 mg/dL — ABNORMAL HIGH (ref 65–99)
Glucose-Capillary: 324 mg/dL — ABNORMAL HIGH (ref 65–99)

## 2015-07-19 LAB — COMPREHENSIVE METABOLIC PANEL
ALK PHOS: 84 U/L (ref 38–126)
ALT: 13 U/L — AB (ref 14–54)
AST: 17 U/L (ref 15–41)
Albumin: 2.2 g/dL — ABNORMAL LOW (ref 3.5–5.0)
Anion gap: 10 (ref 5–15)
BILIRUBIN TOTAL: 0.3 mg/dL (ref 0.3–1.2)
BUN: 43 mg/dL — ABNORMAL HIGH (ref 6–20)
CALCIUM: 8.2 mg/dL — AB (ref 8.9–10.3)
CO2: 24 mmol/L (ref 22–32)
CREATININE: 1.59 mg/dL — AB (ref 0.44–1.00)
Chloride: 104 mmol/L (ref 101–111)
GFR calc non Af Amer: 36 mL/min — ABNORMAL LOW (ref >60)
GFR, EST AFRICAN AMERICAN: 41 mL/min — AB (ref >60)
GLUCOSE: 398 mg/dL — AB (ref 65–99)
Potassium: 3.6 mmol/L (ref 3.5–5.1)
SODIUM: 138 mmol/L (ref 135–145)
TOTAL PROTEIN: 6.7 g/dL (ref 6.5–8.1)

## 2015-07-19 LAB — CBC
HEMATOCRIT: 27.1 % — AB (ref 35.0–47.0)
HEMOGLOBIN: 8.8 g/dL — AB (ref 12.0–16.0)
MCH: 30 pg (ref 26.0–34.0)
MCHC: 32.6 g/dL (ref 32.0–36.0)
MCV: 92.1 fL (ref 80.0–100.0)
Platelets: 181 10*3/uL (ref 150–440)
RBC: 2.94 MIL/uL — AB (ref 3.80–5.20)
RDW: 14.2 % (ref 11.5–14.5)
WBC: 9.4 10*3/uL (ref 3.6–11.0)

## 2015-07-19 MED ORDER — FAMOTIDINE 20 MG PO TABS
20.0000 mg | ORAL_TABLET | Freq: Every day | ORAL | Status: DC
  Administered 2015-07-19 – 2015-07-20 (×2): 20 mg
  Filled 2015-07-19 (×2): qty 1

## 2015-07-19 MED ORDER — ANTISEPTIC ORAL RINSE SOLUTION (CORINZ)
7.0000 mL | Freq: Four times a day (QID) | OROMUCOSAL | Status: DC
  Administered 2015-07-20 (×4): 7 mL via OROMUCOSAL
  Filled 2015-07-19 (×4): qty 7

## 2015-07-19 MED ORDER — ACETAMINOPHEN 325 MG PO TABS: 650.0000 mg | ORAL_TABLET | Freq: Four times a day (QID) | ORAL | Status: DC | PRN

## 2015-07-19 MED ORDER — CHLORHEXIDINE GLUCONATE 0.12% ORAL RINSE (MEDLINE KIT)
15.0000 mL | Freq: Two times a day (BID) | OROMUCOSAL | Status: DC
  Administered 2015-07-20: 15 mL via OROMUCOSAL
  Filled 2015-07-19 (×2): qty 15

## 2015-07-19 MED ORDER — SODIUM CHLORIDE 0.9 % IV BOLUS (SEPSIS)
500.0000 mL | Freq: Once | INTRAVENOUS | Status: AC
  Administered 2015-07-19: 11:00:00 via INTRAVENOUS

## 2015-07-19 MED ORDER — INSULIN GLARGINE 100 UNIT/ML ~~LOC~~ SOLN
50.0000 [IU] | Freq: Every day | SUBCUTANEOUS | Status: DC
  Filled 2015-07-19: qty 0.5

## 2015-07-19 MED ORDER — SODIUM CHLORIDE 0.9 % IV SOLN
INTRAVENOUS | Status: DC
  Administered 2015-07-19: 3.1 [IU]/h via INTRAVENOUS
  Filled 2015-07-19: qty 2.5

## 2015-07-19 NOTE — Progress Notes (Signed)
PULMONARY / CRITICAL CARE MEDICINE   Name: [name] MRN: [id number] DOB: 23-Jan-1961    ADMISSION DATE:  07/17/2015 CONSULTATION DATE:  07/18/15  REFERRING MD:  Nemiah Commander  CHIEF COMPLAINT:  COPD Exacerbation  PT PROFILE: 9 F smoker admitted 06/13 by hospitalist service and transferred to ICU/SDU 06/14 AM for hypercarbia. Failed BiPAP and intubated AM 06/14  MAJOR EVENTS/TEST RESULTS: 06/13 admitted 06/14 transferred to ICU/SDU, failed BiPAP and intubated  INDWELLING DEVICES:: ETT 06/14 >>  L [place] CVL 06/14 >>   MICRO DATA: MRSA PCR 06/14 >>  resp 06/14 >>   ANTIMICROBIALS:  Ceftriaxone 06/13 >>    SUBJECTIVE:  RASS -1, + F/C  VITAL SIGNS: BP 147/60 mmHg  Pulse 105  Temp(Src) 98.2 F (36.8 C) (Oral)  Resp 26  Ht  (1.575 m)  Wt 177 lb 7.5 oz (80.5 kg)  BMI 32.45 kg/m2  SpO2 96%  HEMODYNAMICS:    VENTILATOR SETTINGS: Vent Mode:  [-] PRVC FiO2 (%):  [30 %-50 %] 30 % Set Rate:  [14 bmp] 14 bmp Vt Set:  [500 mL] 500 mL PEEP:  [5 cmH20] 5 cmH20 Plateau Pressure:  [12 cmH20-17 cmH20] 12 cmH20  INTAKE / OUTPUT: I/O last 3 completed shifts: In: 1133.1 [I.V.:802.4; NG/GT:280.7; IV Piggyback:50] Out: 1050 [Urine:1050]  PHYSICAL EXAMINATION: General: RASS -1, + F/C Neuro: CNs intact, MAEs HEENT: NCAT Cardiovascular: Reg, no M Lungs: rhonchi, prolonged expiratory phase, no wheezes anteriorly Abdomen: Soft, NT, + BS Ext: warm, no edema  LABS:  BMET  Recent Labs Lab 07/18/15 0553 07/18/15 1345 07/19/15 0440  NA 139 141 138  K 5.4* 3.7 3.6  CL 108 109 104  CO2 BUN 34* 32* 43*  CREATININE 1.51* 1.42* 1.59*  GLUCOSE 207* 174* 398*    Electrolytes  Recent Labs Lab 07/18/15 0553 07/18/15 1345 07/19/15 0440  CALCIUM 8.2* 8.1* 8.2*    CBC  Recent Labs Lab 07/17/15 1455 07/18/15 0553 07/19/15 0440  WBC 13.1* 12.6* 9.4  HGB 11.1* 10.5* 8.8*  HCT 34.0* 31.8* 27.1*  PLT 179 163 181    Coag's No results for  input(s): APTT, INR in the last 168 hours.  Sepsis Markers No results for input(s): LATICACIDVEN, PROCALCITON, O2SATVEN in the last 168 hours.  ABG  Recent Labs Lab 07/18/15 0105 07/18/15 0700  PHART 7.28* 7.21*  PCO2ART 57* 68*  PO2ART 59* 68*    Liver Enzymes  Recent Labs Lab 07/17/15 1455 07/19/15 0440  AST 15 17  ALT 10* 13*  ALKPHOS 94 84  BILITOT 0.8 0.3  ALBUMIN 2.9* 2.2*    Cardiac Enzymes  Recent Labs Lab 07/17/15 1455  TROPONINI <0.03    Glucose  Recent Labs Lab 07/18/15 1051 07/18/15 1612 07/18/15 1940 07/19/15 0006 07/19/15 0354 07/19/15 0736  GLUCAP 189* 186* 262* 368* 378* 338*    CXR: plate-like atx in R mid-lung    ASSESSMENT / PLAN:  PULMONARY A: Acute hypoxemic/hypercarbic respiratory failure COPD exacerbation  Smoker P:   Cont full vent support - settings reviewed and/or adjusted Cont vent bundle Daily SBT if/when meets criteria Cont nebulized steroids and bronchodilators  CARDIOVASCULAR A Hx of hypertension  Hx of hyperlipidemia P Monitor BP and rhythm Holding lisinopril home management  RENAL A:   AKI, nonoliguric Hyperkalemia, resolved P:   Monitor BMET intermittently Monitor I/Os Correct electrolytes as indicated  GASTROINTESTINAL A:   No active issues P SUP: enteral famotidine Cont TF protocol  HEMATOLOGICAL A Anemia without acute bleeding  P:  DVT px: SQ heparin Monitor CBC intermittently Transfuse per usual guidelines  INFECTIOUS A:   Pyuria Purulent bronchitis Leukocytosis, resolved P:   Monitor temp, WBC count Micro and abx as above  ENDOCRINE A:   DM2  Severe hyperglycemia exacerbated by systemic steroids  P:   DC methylprednisolone Cont SSI Increase Lantus 06/15  NEUROLOGIC A:   History of anxiety/depression Chronic pain P:   RASS goal: -1, -2 PAD protocol   CCM time: 30 mins The above time includes time spent in consultation with patient and/or family members  and reviewing care plan on multidisciplinary rounds  Billy Fischeravid Simonds, MD PCCM service Mobile [phone] Pager [phone] 07/19/2015

## 2015-07-19 NOTE — Progress Notes (Signed)
Pharmacy Antibiotic Note  [name] is a 55 y.o. female admitted on 07/17/2015 with UTI.  Pharmacy has been consulted for ceftriaxone dosing.  Plan: Will continue ceftriaxone 1g IV Q24hr.    Height: 5\' 2"  (157.5 cm) Weight: 177 lb 7.5 oz (80.5 kg) IBW/kg (Calculated) : 50.1  Temp (24hrs), Avg:98.6 F (37 C), Min:98.2 F (36.8 C), Max:99.2 F (37.3 C)   Recent Labs Lab 07/17/15 1455 07/18/15 0553 07/18/15 1345 07/19/15 0440  WBC 13.1* 12.6*  --  9.4  CREATININE 1.55* 1.51* 1.42* 1.59*    Estimated Creatinine Clearance: 39.3 mL/min (by C-G formula based on Cr of 1.59).    No Known Allergies  Antimicrobials this admission: ceftriaxone 6/13 >>   Dose adjustments this admission:  Microbiology results: 6/13 UCx: Sent  6/14 Sputum: pending  6/14 MRSA PCR: negative   Pharmacy will continue to monitor and adjust per consult.    [name],[name], PharmD Clinical Pharmacist 07/19/2015 9:36 AM

## 2015-07-19 NOTE — Care Management (Signed)
Barrier to discharge - required intubation 6/14.  now on full ventilator support

## 2015-07-19 NOTE — Plan of Care (Signed)
Problem: Activity: Goal: Ability to tolerate increased activity will improve Outcome: Progressing Bedrest. Continuous lateral rotation and frequent position changes. Chair position attempt was brief due to agitation.  PROM during bath and linen change  Problem: Coping: Goal: Level of anxiety will decrease Outcome: Progressing Propofol currently at 60mcg. At 8am, propofol was at lower dose. She was able to nod, follow commands, and wave to daughters.  But after bath and linen change, she became increasingly anxious, tachypneic and agitated requiring more propfol.  Problem: Nutritional: Goal: Intake of prescribed amount of daily calories will improve Outcome: Progressing Tolerating TF at 40/hr.  Problem: Respiratory: Goal: Ability to maintain a clear airway and adequate ventilation will improve Outcome: Progressing Secretions less today.  Lungs with few coarse rhonchi and wheezes.  Tolerated PSV for about 2 hrs. Currently Vent: fiO2 28%, rate 14, TV 500, 5peep. Sats 94-96%  Problem: Role Relationship: Goal: Method of communication will improve Outcome: Progressing When awakened, tries to mouth words and nods yes/no  Problem: Skin Integrity Impairment Risk: Goal: Skin integrity will improve Outcome: Progressing No breakdown other than small bruising scattered  Problem: Safety: Goal: Ability to remain free from injury will improve Outcome: Progressing No injury.  Problem: Fluid Volume: Goal: Ability to maintain a balanced intake and output will improve Outcome: Progressing UOP is adequate. Approx 8630ml/hr.  500ml bolus given earlier had little effect on uop  Problem: Bowel/Gastric: Goal: Will not experience complications related to bowel motility Outcome: Progressing Hypoactive bowel sounds .  Abdomen soft.  No stools today.

## 2015-07-19 NOTE — Progress Notes (Signed)
Inpatient Diabetes Program Recommendations  AACE/ADA: New Consensus Statement on Inpatient Glycemic Control (2015)  Target Ranges:  Prepandial:   less than 140 mg/dL      Peak postprandial:   less than 180 mg/dL (1-2 hours)      Critically ill patients:  140 - 180 mg/dL   Lab Results  Component Value Date   GLUCAP 338* 07/19/2015   HGBA1C 10.8* 07/17/2015    Review of Glycemic Control  Results for [name], [name] (MRN [id number]) as of 07/19/2015 08:45  Ref. Range 07/18/2015 16:12 07/18/2015 19:40 07/19/2015 00:06 07/19/2015 03:54 07/19/2015 07:36  Glucose-Capillary Latest Ref Range: 65-99 mg/dL 782186 (H) 956262 (H) 213368 (H) 378 (H) 338 (H)    Diabetes history: Type 2 Outpatient Diabetes medications: Novolog 10 units tid, Lantus 72 units qhs Current orders for Inpatient glycemic control: Lantus 50 units qhs, Novolog 0-20 units qhs  Inpatient Diabetes Program Recommendations: Patient meets the criteria for ICU Glycemic Control protocol.  Please consider starting patient on IV insulin per protocol.   Susette RacerJulie Tiphani Mells, RN, BA, MHA, CDE Diabetes Coordinator Inpatient Diabetes Program  [phone] (Team Pager) [phone] [facility] Office) 07/19/2015 8:48 AM

## 2015-07-19 NOTE — Progress Notes (Signed)
Nutrition Follow-up  DOCUMENTATION CODES:   Obesity unspecified  INTERVENTION:  -Recommend continuing current TF regimen of Vital High Protein at 40 ml/hr, Prostat daily providing 100 g of protein, 1060 kcals. Additional calories from diprivan. Continue PEPuP protocol. Continue to assess   NUTRITION DIAGNOSIS:   Inadequate oral intake related to acute illness as evidenced by NPO status.  Being addressed via TF  GOAL:   Provide needs based on ASPEN/SCCM guidelines  MONITOR:   Vent status, Labs, Weight trends, TF tolerance  REASON FOR ASSESSMENT:   Consult, Ventilator Enteral/tube feeding initiation and management  ASSESSMENT:   55 yo female admitted with acute respiratory failure with COPD exacerbation, failed BiPap intervention requiring intubation on 07/18/15  Pt remains on vent support, diprivan provided 288 kcals in past 24 hours  Tolerating Vital High Protein at rate of 40 ml/hr  Diet Order:   NPO  Skin:  Reviewed, no issues  Last BM:  07/13/15   Labs:   Glucose Profile:   Recent Labs  07/19/15 0354 07/19/15 0736 07/19/15 1106  GLUCAP 378* 338* 292*   Meds: ss novolog, lantus dose increased today, solumedrol discontinued, diprivan  Height:   Ht Readings from Last 1 Encounters:  07/18/15 5\' 2"  (1.575 m)    Weight:   Wt Readings from Last 1 Encounters:  07/18/15 177 lb 7.5 oz (80.5 kg)    Ideal Body Weight:  50 kg  BMI:  Body mass index is 32.45 kg/(m^2).  Estimated Nutritional Needs:   Kcal:  [phone] kcals   Protein:  >/= 100 g  Fluid:  >/= 1.5 L  EDUCATION NEEDS:   No education needs identified at this time  Romelle StarcherCate Shatara Stanek MS, RD, LDN [phone] Pager  [phone] Weekend/On-Call Pager

## 2015-07-19 NOTE — Plan of Care (Signed)
Problem: Metabolic: Goal: Ability to maintain appropriate glucose levels will improve Outcome: Progressing Blood sugars increased 300's during night shift.  Solumedrol started yesterday.  Dr Bard HerbertSimmonds increased lantus, changed sliding scale and stopped solumedrol.  He spoke with Diabetic coordinator

## 2015-07-20 ENCOUNTER — Inpatient Hospital Stay: Payer: Medicaid Other

## 2015-07-20 LAB — BASIC METABOLIC PANEL
Anion gap: 7 (ref 5–15)
BUN: 49 mg/dL — AB (ref 6–20)
CHLORIDE: 109 mmol/L (ref 101–111)
CO2: 25 mmol/L (ref 22–32)
Calcium: 8.1 mg/dL — ABNORMAL LOW (ref 8.9–10.3)
Creatinine, Ser: 1.25 mg/dL — ABNORMAL HIGH (ref 0.44–1.00)
GFR calc Af Amer: 55 mL/min — ABNORMAL LOW (ref >60)
GFR calc non Af Amer: 48 mL/min — ABNORMAL LOW (ref >60)
GLUCOSE: 153 mg/dL — AB (ref 65–99)
POTASSIUM: 2.9 mmol/L — AB (ref 3.5–5.1)
Sodium: 141 mmol/L (ref 135–145)

## 2015-07-20 LAB — GLUCOSE, CAPILLARY
GLUCOSE-CAPILLARY: 158 mg/dL — AB (ref 65–99)
GLUCOSE-CAPILLARY: 150 mg/dL — AB (ref 65–99)
GLUCOSE-CAPILLARY: 146 mg/dL — AB (ref 65–99)
GLUCOSE-CAPILLARY: 136 mg/dL — AB (ref 65–99)
GLUCOSE-CAPILLARY: 139 mg/dL — AB (ref 65–99)
GLUCOSE-CAPILLARY: 117 mg/dL — AB (ref 65–99)
GLUCOSE-CAPILLARY: 168 mg/dL — AB (ref 65–99)
Glucose-Capillary: 104 mg/dL — ABNORMAL HIGH (ref 65–99)
Glucose-Capillary: 113 mg/dL — ABNORMAL HIGH (ref 65–99)
Glucose-Capillary: 127 mg/dL — ABNORMAL HIGH (ref 65–99)
Glucose-Capillary: 151 mg/dL — ABNORMAL HIGH (ref 65–99)
Glucose-Capillary: 223 mg/dL — ABNORMAL HIGH (ref 65–99)

## 2015-07-20 LAB — CULTURE, RESPIRATORY W GRAM STAIN: Culture: NORMAL

## 2015-07-20 LAB — BLOOD GAS, ARTERIAL
Acid-base deficit: 0.7 mmol/L (ref 0.0–2.0)
Acid-base deficit: 1.4 mmol/L (ref 0.0–2.0)
Allens test (pass/fail): POSITIVE — AB
Allens test (pass/fail): POSITIVE — AB
Bicarbonate: 26.8 mEq/L (ref 21.0–28.0)
Bicarbonate: 27.2 mEq/L (ref 21.0–28.0)
Expiratory PAP: 6
FIO2: 0.32
Inspiratory PAP: 16
Mode: POSITIVE
O2 Saturation: 86.4 %
O2 Saturation: 88.7 %
Patient temperature: 37
Patient temperature: 37
RATE: 12 resp/min
pCO2 arterial: 57 mmHg — ABNORMAL HIGH (ref 32.0–48.0)
pCO2 arterial: 68 mmHg — ABNORMAL HIGH (ref 32.0–48.0)
pH, Arterial: 7.28 — ABNORMAL LOW (ref 7.350–7.450)
pH, Arterial: 7.21 — ABNORMAL LOW (ref 7.350–7.450)
pO2, Arterial: 59 mmHg — ABNORMAL LOW (ref 83.0–108.0)
pO2, Arterial: 68 mmHg — ABNORMAL LOW (ref 83.0–108.0)

## 2015-07-20 LAB — CBC
HEMATOCRIT: 23.1 % — AB (ref 35.0–47.0)
Hemoglobin: 7.8 g/dL — ABNORMAL LOW (ref 12.0–16.0)
MCH: 30 pg (ref 26.0–34.0)
MCHC: 33.6 g/dL (ref 32.0–36.0)
MCV: 89.3 fL (ref 80.0–100.0)
Platelets: 220 10*3/uL (ref 150–440)
RBC: 2.59 MIL/uL — ABNORMAL LOW (ref 3.80–5.20)
RDW: 14.2 % (ref 11.5–14.5)
WBC: 8.7 10*3/uL (ref 3.6–11.0)

## 2015-07-20 LAB — URINE CULTURE: Culture: >=100000 — AB

## 2015-07-20 LAB — CULTURE, RESPIRATORY: SPECIAL REQUESTS: NORMAL

## 2015-07-20 MED ORDER — INSULIN GLARGINE 100 UNIT/ML ~~LOC~~ SOLN
30.0000 [IU] | SUBCUTANEOUS | Status: DC
  Administered 2015-07-20: 30 [IU] via SUBCUTANEOUS
  Filled 2015-07-20: qty 0.3

## 2015-07-20 MED ORDER — LISINOPRIL 10 MG PO TABS
10.0000 mg | ORAL_TABLET | Freq: Every day | ORAL | Status: DC
Start: 2015-07-20 — End: 2015-07-22
  Administered 2015-07-20 – 2015-07-21 (×2): 10 mg via ORAL
  Filled 2015-07-20 (×2): qty 1

## 2015-07-20 MED ORDER — GABAPENTIN 300 MG PO CAPS
300.0000 mg | ORAL_CAPSULE | Freq: Three times a day (TID) | ORAL | Status: DC
  Administered 2015-07-20 – 2015-07-23 (×10): 300 mg via ORAL
  Filled 2015-07-20 (×10): qty 1

## 2015-07-20 MED ORDER — ALPRAZOLAM 0.25 MG PO TABS
0.2500 mg | ORAL_TABLET | Freq: Three times a day (TID) | ORAL | Status: DC | PRN
  Administered 2015-07-20: 0.25 mg via ORAL
  Filled 2015-07-20: qty 1

## 2015-07-20 MED ORDER — INSULIN ASPART 100 UNIT/ML ~~LOC~~ SOLN: 0.0000 [IU] | SUBCUTANEOUS | Status: DC

## 2015-07-20 MED ORDER — INSULIN ASPART 100 UNIT/ML ~~LOC~~ SOLN
4.0000 [IU] | Freq: Three times a day (TID) | SUBCUTANEOUS | Status: DC
Start: 2015-07-20 — End: 2015-07-23
  Administered 2015-07-20 – 2015-07-23 (×7): 4 [IU] via SUBCUTANEOUS
  Filled 2015-07-20 (×7): qty 4

## 2015-07-20 MED ORDER — PNEUMOCOCCAL VAC POLYVALENT 25 MCG/0.5ML IJ INJ: 0.5000 mL | INJECTION | INTRAMUSCULAR | Status: AC | PRN

## 2015-07-20 MED ORDER — OXYCODONE-ACETAMINOPHEN 7.5-325 MG PO TABS
1.0000 | ORAL_TABLET | Freq: Four times a day (QID) | ORAL | Status: DC | PRN
  Administered 2015-07-20 – 2015-07-23 (×7): 1 via ORAL
  Filled 2015-07-20 (×7): qty 1

## 2015-07-20 MED ORDER — ACETAMINOPHEN 325 MG PO TABS
650.0000 mg | ORAL_TABLET | Freq: Four times a day (QID) | ORAL | Status: DC | PRN
  Administered 2015-07-20: 650 mg via ORAL
  Filled 2015-07-20: qty 2

## 2015-07-20 MED ORDER — INSULIN ASPART 100 UNIT/ML ~~LOC~~ SOLN
5.0000 [IU] | SUBCUTANEOUS | Status: DC
  Administered 2015-07-20: 5 [IU] via SUBCUTANEOUS
  Filled 2015-07-20: qty 5

## 2015-07-20 MED ORDER — ATORVASTATIN CALCIUM 20 MG PO TABS
40.0000 mg | ORAL_TABLET | Freq: Every day | ORAL | Status: DC
  Administered 2015-07-21 – 2015-07-23 (×3): 40 mg via ORAL
  Filled 2015-07-20 (×3): qty 2

## 2015-07-20 MED ORDER — PHENOL 1.4 % MT LIQD
1.0000 | OROMUCOSAL | Status: DC | PRN
  Administered 2015-07-20: 1 via OROMUCOSAL
  Filled 2015-07-20: qty 177

## 2015-07-20 MED ORDER — INSULIN GLARGINE 100 UNIT/ML ~~LOC~~ SOLN
30.0000 [IU] | Freq: Every day | SUBCUTANEOUS | Status: DC
  Filled 2015-07-20: qty 0.3

## 2015-07-20 MED ORDER — TRAZODONE HCL 50 MG PO TABS
100.0000 mg | ORAL_TABLET | Freq: Every evening | ORAL | Status: DC | PRN
  Administered 2015-07-21: 100 mg via ORAL
  Filled 2015-07-20: qty 2

## 2015-07-20 MED ORDER — AMITRIPTYLINE HCL 50 MG PO TABS
50.0000 mg | ORAL_TABLET | Freq: Every day | ORAL | Status: DC
  Administered 2015-07-20 – 2015-07-22 (×3): 50 mg via ORAL
  Filled 2015-07-20 (×3): qty 1

## 2015-07-20 MED ORDER — POTASSIUM CHLORIDE 20 MEQ/15ML (10%) PO SOLN
40.0000 meq | ORAL | Status: AC
  Administered 2015-07-20 (×2): 40 meq
  Filled 2015-07-20 (×2): qty 30

## 2015-07-20 MED ORDER — OXYCODONE-ACETAMINOPHEN 7.5-325 MG PO TABS: 1.0000 | ORAL_TABLET | ORAL | Status: DC | PRN

## 2015-07-20 MED ORDER — INSULIN ASPART 100 UNIT/ML ~~LOC~~ SOLN: 2.0000 [IU] | SUBCUTANEOUS | Status: DC

## 2015-07-20 MED ORDER — HYDRALAZINE HCL 25 MG PO TABS
50.0000 mg | ORAL_TABLET | Freq: Once | ORAL | Status: AC
  Administered 2015-07-21: 50 mg via ORAL
  Filled 2015-07-20: qty 2

## 2015-07-20 MED ORDER — INSULIN ASPART 100 UNIT/ML ~~LOC~~ SOLN
0.0000 [IU] | Freq: Three times a day (TID) | SUBCUTANEOUS | Status: DC
  Administered 2015-07-20: 2 [IU] via SUBCUTANEOUS
  Administered 2015-07-21: 5 [IU] via SUBCUTANEOUS
  Administered 2015-07-21: 8 [IU] via SUBCUTANEOUS
  Administered 2015-07-22 (×2): 3 [IU] via SUBCUTANEOUS
  Administered 2015-07-22: 8 [IU] via SUBCUTANEOUS
  Administered 2015-07-23: 3 [IU] via SUBCUTANEOUS
  Administered 2015-07-23: 8 [IU] via SUBCUTANEOUS
  Administered 2015-07-23: 3 [IU] via SUBCUTANEOUS
  Filled 2015-07-20: qty 3
  Filled 2015-07-20: qty 5
  Filled 2015-07-20: qty 2
  Filled 2015-07-20: qty 3
  Filled 2015-07-20 (×2): qty 8
  Filled 2015-07-20: qty 2
  Filled 2015-07-20: qty 8
  Filled 2015-07-20 (×2): qty 3

## 2015-07-20 MED ORDER — INSULIN ASPART 100 UNIT/ML ~~LOC~~ SOLN: 0.0000 [IU] | Freq: Every day | SUBCUTANEOUS | Status: DC

## 2015-07-20 MED ORDER — POTASSIUM CHLORIDE 2 MEQ/ML IV SOLN
INTRAVENOUS | Status: DC
  Administered 2015-07-20: 09:00:00 via INTRAVENOUS
  Filled 2015-07-20 (×3): qty 1000

## 2015-07-20 MED ORDER — CETYLPYRIDINIUM CHLORIDE 0.05 % MT LIQD
7.0000 mL | Freq: Two times a day (BID) | OROMUCOSAL | Status: DC
  Administered 2015-07-20: 7 mL via OROMUCOSAL

## 2015-07-20 NOTE — Progress Notes (Signed)
PULMONARY / CRITICAL CARE MEDICINE   Name: Teresa BastDeborah W Erway MRN: [id number] DOB: 1960/07/08    ADMISSION DATE:  07/17/2015 CONSULTATION DATE:  07/18/15  REFERRING MD:  Nemiah CommanderKalisetti  CHIEF COMPLAINT:  COPD Exacerbation  PT PROFILE: [age] F smoker admitted 06/13 by hospitalist service and transferred to ICU/SDU 06/14 AM for hypercarbia. Failed BiPAP and intubated AM 06/14  MAJOR EVENTS/TEST RESULTS: 06/13 admitted 06/14 transferred to ICU/SDU, failed BiPAP and intubated 06/16 Passed SBT. Extubated  INDWELLING DEVICES:: ETT 06/14 >> 06/16 L [place] CVL 06/14 >>   MICRO DATA: Urine 06/13 >> E coli MRSA PCR 06/14 >> NEG resp 06/14 >>   ANTIMICROBIALS:  Ceftriaxone 06/13 >>    SUBJECTIVE:  RASS 0, + F/C. Passed SBT. Extubated. :ooks good initially  VITAL SIGNS: BP 167/72 mmHg  Pulse 105  Temp(Src) 98.8 F (37.1 C) (Oral)  Resp 30  Ht 5\' 2"  (1.575 m)  Wt 177 lb 7.5 oz (80.5 kg)  BMI 32.45 kg/m2  SpO2 96%  HEMODYNAMICS:    VENTILATOR SETTINGS: Vent Mode:  [-] PSV;CPAP FiO2 (%):  [28 %-36 %] 36 % Set Rate:  [14 bmp] 14 bmp Vt Set:  [500 mL] 500 mL PEEP:  [5 cmH20] 5 cmH20 Pressure Support:  [5 cmH20-20 cmH20] 5 cmH20  INTAKE / OUTPUT: I/O last 3 completed shifts: In: 2925.3 [I.V.:975.3; NG/GT:1400; IV Piggyback:550] Out: 1890 [Urine:1890]  PHYSICAL EXAMINATION: General: RASS 0, + F/C Neuro: CNs intact, MAEs HEENT: NCAT Cardiovascular: Reg, no M Lungs: no wheezes, scattered rhonchi Abdomen: Soft, NT, + BS Ext: warm, no edema  LABS:  BMET  Recent Labs Lab 07/18/15 1345 07/19/15 0440 07/20/15 0430  NA 141 138 141  K 3.7 3.6 2.9*  CL 109 104 109  CO2 25 24 25   BUN 32* 43* 49*  CREATININE 1.42* 1.59* 1.25*  GLUCOSE 174* 398* 153*    Electrolytes  Recent Labs Lab 07/18/15 1345 07/19/15 0440 07/20/15 0430  CALCIUM 8.1* 8.2* 8.1*    CBC  Recent Labs Lab 07/18/15 0553 07/19/15 0440 07/20/15 0430  WBC 12.6* 9.4 8.7  HGB 10.5* 8.8* 7.8*  HCT  31.8* 27.1* 23.1*  PLT 163 181 220    Coag's No results for input(s): APTT, INR in the last 168 hours.  Sepsis Markers No results for input(s): LATICACIDVEN, PROCALCITON, O2SATVEN in the last 168 hours.  ABG  Recent Labs Lab 07/18/15 0105 07/18/15 0700  PHART 7.28* 7.21*  PCO2ART 57* 68*  PO2ART 59* 68*    Liver Enzymes  Recent Labs Lab 07/17/15 1455 07/19/15 0440  AST 15 17  ALT 10* 13*  ALKPHOS 94 84  BILITOT 0.8 0.3  ALBUMIN 2.9* 2.2*    Cardiac Enzymes  Recent Labs Lab 07/17/15 1455  TROPONINI <0.03    Glucose  Recent Labs Lab 07/20/15 0302 07/20/15 0403 07/20/15 0505 07/20/15 0624 07/20/15 0725 07/20/15 0828  GLUCAP 151* 146* 136* 113* 139* 117*    CXR: improving plate-like atx in R mid-lung    ASSESSMENT / PLAN:  PULMONARY A: Acute hypoxemic/hypercarbic respiratory failure COPD exacerbation  Smoker P:   Monitor in ICU post extubation Cont nebulized steroids and bronchodilators  CARDIOVASCULAR A Hx of hypertension  Hx of hyperlipidemia P Monitor BP and rhythm Holding lisinopril home management  RENAL A:   AKI, nonoliguric - improving  Hyperkalemia, resolved Hypokalemia P:   Monitor BMET intermittently Monitor I/Os Correct electrolytes as indicated  GASTROINTESTINAL A:   No active issues P SUP: N/I post extubation NPO X 4 hours  post extubation  HEMATOLOGICAL A Anemia without acute bleeding P:  DVT px: enoxaparin Monitor CBC intermittently Transfuse per usual guidelines  INFECTIOUS A:   E coli UTI Purulent bronchitis Leukocytosis, resolved P:   Monitor temp, WBC count Micro and abx as above Plan to complete 5 days ceftriaxone  ENDOCRINE A:   DM2  Severe hyperglycemia exacerbated by systemic steroids  P:   Cont SSI - changed to ACHS 06/16 Cont Lantus  NEUROLOGIC A:   History of anxiety/depression Chronic pain P:   RASS goal: 0 PRN acetaminophen Low dose PRN Percocet   CCM time: 35  mins The above time includes time spent in consultation with patient and/or family members and reviewing care plan on multidisciplinary rounds  Billy Fischer, MD PCCM service Mobile [phone] Pager [phone] 07/20/2015

## 2015-07-20 NOTE — Progress Notes (Signed)
[facility] AD[facility] REPLACEMENT PROTOCOL FOR AM LAB REPLACEMENT ONLY  The patient does apply for the [facility] Electrolyte [facility] Protocol based on the criteria listed below:   1. Is GFR >/= 40 ml/min? Yes.    Patient's GFR today is 48 2. Is urine output >/= 0.5 ml/kg/hr for the last 6 hours? Yes.   Patient's UOP is 0.93 ml/kg/hr 3. Is BUN < 60 mg/dL? Yes.    Patient's BUN today is 49 4. Abnormal electrolyte(s):  K - 2.9 5. Ordered repletion with: PER PROTOCOL 6. If a panic level lab has been reported, has the CCM MD in charge been notified? Yes.  .   Physician:  Dr. Lorre Munroeeterding  [name] 07/20/2015 5:20 AM

## 2015-07-20 NOTE — Progress Notes (Signed)
Inpatient Diabetes Program Recommendations  AACE/ADA: New Consensus Statement on Inpatient Glycemic Control (2015)  Target Ranges:  Prepandial:   less than 140 mg/dL      Peak postprandial:   less than 180 mg/dL (1-2 hours)      Critically ill patients:  140 - 180 mg/dL   Lab Results  Component Value Date   GLUCAP 139* 07/20/2015   HGBA1C 10.8* 07/17/2015    Review of Glycemic Control  Results for [name], Teresa W (MRN [id number]) as of 07/20/2015 08:22  Ref. Range 07/20/2015 03:02 07/20/2015 04:03 07/20/2015 05:05 07/20/2015 06:24 07/20/2015 07:25  Glucose-Capillary Latest Ref Range: 65-99 mg/dL 409151 (H) 811146 (H) 914136 (H) 113 (H) 139 (H)     Diabetes history: Type 2 Outpatient Diabetes medications: Novolog 10 units tid, Lantus 72 units qhs Current orders for Inpatient glycemic control: Lantus 30 units qhs, Novolog 2-6 units q4h, Novolog 5 units q4h  Patient placed on ICU Glycemic Control Phase 2 order set last night- CBG improved nicely.  Lantus given at 0643am and patient taken off the stabilizer at 0822 when 5 units of Novolog was given. 6 consecutive blood sugars were less than 180mg /dl.    Agree with current orders.   Susette RacerJulie Louella Medaglia, RN, BA, MHA, CDE Diabetes Coordinator Inpatient Diabetes Program  [phone] (Team Pager) [phone] [facility] Office) 07/20/2015 10:06 AM

## 2015-07-20 NOTE — Progress Notes (Signed)
1815 patient complains of severe pain but asleep 5 minutes later. Also apologetic one min ute and then snappy and ugly. No prn given for pain except Tylenol plain.

## 2015-07-20 NOTE — Progress Notes (Signed)
Nutrition Follow-up  DOCUMENTATION CODES:   Obesity unspecified  INTERVENTION:  -If po intake inadequate on follow-up, recommend addition of nutritional supplement -Cater to pt preferences   NUTRITION DIAGNOSIS:   Inadequate oral intake related to acute illness as evidenced by NPO status.  Being addressed as diet advanced post extubation   GOAL:   Provide needs based on ASPEN/SCCM guidelines  MONITOR:   PO intake, Labs, Weight trends  REASON FOR ASSESSMENT:   Consult, Ventilator Enteral/tube feeding initiation and management  ASSESSMENT:   55 yo female admitted with acute respiratory failure with COPD exacerbation, failed BiPap intervention requiring intubation on 07/18/15  Pt s/p extubation this AM  Diet Order:  Diet heart healthy/carb modified Room service appropriate?: Yes; Fluid consistency:: Thin   Energy Intake: no recorded po intake yet post extubation, diet just advanced  Skin:  Reviewed, no issues  Last BM:  07/13/15   Labs: potassium 2.9  Meds: ss novolog, novolog with meals,LR at 50 ml/hr  Height:   Ht Readings from Last 1 Encounters:  07/18/15 5\' 2"  (1.575 m)    Weight:   Wt Readings from Last 1 Encounters:  07/18/15 177 lb 7.5 oz (80.5 kg)    Ideal Body Weight:  50 kg  BMI:  Body mass index is 32.45 kg/(m^2).  Estimated Nutritional Needs:   Kcal:  2000-2200 kcals   Protein:  >/=75 g  Fluid:  >/= 2 L  EDUCATION NEEDS:   No education needs identified at this time  Romelle StarcherCate Odella Appelhans MS, RD, LDN [phone] Pager  [phone] Weekend/On-Call Pager

## 2015-07-20 NOTE — Progress Notes (Signed)
1800 remains extubated since 0930.Complains of throat soreness but not medicated. Eating without difficulty. Coughs constantly.O2 at 4 liters.

## 2015-07-21 DIAGNOSIS — I10 Essential (primary) hypertension: Secondary | ICD-10-CM

## 2015-07-21 LAB — BASIC METABOLIC PANEL
ANION GAP: 9 (ref 5–15)
BUN: 24 mg/dL — ABNORMAL HIGH (ref 6–20)
CHLORIDE: 105 mmol/L (ref 101–111)
CO2: 27 mmol/L (ref 22–32)
Calcium: 8.6 mg/dL — ABNORMAL LOW (ref 8.9–10.3)
Creatinine, Ser: 0.95 mg/dL (ref 0.44–1.00)
GFR calc non Af Amer: >60 mL/min (ref >60)
Glucose, Bld: 242 mg/dL — ABNORMAL HIGH (ref 65–99)
Potassium: 4.4 mmol/L (ref 3.5–5.1)
Sodium: 141 mmol/L (ref 135–145)

## 2015-07-21 LAB — GLUCOSE, CAPILLARY
GLUCOSE-CAPILLARY: 239 mg/dL — AB (ref 65–99)
GLUCOSE-CAPILLARY: 216 mg/dL — AB (ref 65–99)
Glucose-Capillary: 273 mg/dL — ABNORMAL HIGH (ref 65–99)
Glucose-Capillary: 141 mg/dL — ABNORMAL HIGH (ref 65–99)
Glucose-Capillary: 163 mg/dL — ABNORMAL HIGH (ref 65–99)

## 2015-07-21 LAB — CBC
HCT: 30.5 % — ABNORMAL LOW (ref 35.0–47.0)
HEMOGLOBIN: 10 g/dL — AB (ref 12.0–16.0)
MCH: 29.9 pg (ref 26.0–34.0)
MCHC: 32.9 g/dL (ref 32.0–36.0)
MCV: 90.9 fL (ref 80.0–100.0)
Platelets: 270 10*3/uL (ref 150–440)
RBC: 3.36 MIL/uL — AB (ref 3.80–5.20)
RDW: 14.6 % — ABNORMAL HIGH (ref 11.5–14.5)
WBC: 13.3 10*3/uL — ABNORMAL HIGH (ref 3.6–11.0)

## 2015-07-21 LAB — PHOSPHORUS: Phosphorus: 3.3 mg/dL (ref 2.5–4.6)

## 2015-07-21 LAB — MAGNESIUM: MAGNESIUM: 1.5 mg/dL — AB (ref 1.7–2.4)

## 2015-07-21 MED ORDER — HYDROCHLOROTHIAZIDE 25 MG PO TABS
25.0000 mg | ORAL_TABLET | Freq: Every day | ORAL | Status: DC
  Administered 2015-07-21: 25 mg via ORAL
  Filled 2015-07-21 (×2): qty 1

## 2015-07-21 MED ORDER — ALPRAZOLAM 1 MG PO TABS: 1.0000 mg | ORAL_TABLET | Freq: Three times a day (TID) | ORAL | Status: DC | PRN

## 2015-07-21 MED ORDER — TRAZODONE HCL 50 MG PO TABS
200.0000 mg | ORAL_TABLET | Freq: Every evening | ORAL | Status: DC | PRN
  Administered 2015-07-21 – 2015-07-22 (×2): 200 mg via ORAL
  Filled 2015-07-21 (×2): qty 4

## 2015-07-21 MED ORDER — ALPRAZOLAM 0.5 MG PO TABS
0.5000 mg | ORAL_TABLET | Freq: Three times a day (TID) | ORAL | Status: DC | PRN
  Administered 2015-07-21 – 2015-07-23 (×4): 0.5 mg via ORAL
  Filled 2015-07-21 (×4): qty 1

## 2015-07-21 MED ORDER — HYDRALAZINE HCL 25 MG PO TABS
25.0000 mg | ORAL_TABLET | Freq: Three times a day (TID) | ORAL | Status: DC
  Administered 2015-07-21: 25 mg via ORAL
  Filled 2015-07-21: qty 1

## 2015-07-21 MED ORDER — ALPRAZOLAM 1 MG PO TABS
1.0000 mg | ORAL_TABLET | Freq: Once | ORAL | Status: AC
  Administered 2015-07-21: 1 mg via ORAL
  Filled 2015-07-21: qty 1

## 2015-07-21 MED ORDER — HYDRALAZINE HCL 25 MG PO TABS: 50.0000 mg | ORAL_TABLET | Freq: Three times a day (TID) | ORAL | Status: DC

## 2015-07-21 MED ORDER — INSULIN GLARGINE 100 UNIT/ML ~~LOC~~ SOLN
35.0000 [IU] | Freq: Every day | SUBCUTANEOUS | Status: DC
  Administered 2015-07-21 – 2015-07-23 (×3): 35 [IU] via SUBCUTANEOUS
  Filled 2015-07-21 (×4): qty 0.35

## 2015-07-21 MED ORDER — MAGNESIUM SULFATE 4 GM/100ML IV SOLN
4.0000 g | Freq: Once | INTRAVENOUS | Status: DC
  Filled 2015-07-21: qty 100

## 2015-07-21 MED ORDER — HYDRALAZINE HCL 25 MG PO TABS: 25.0000 mg | ORAL_TABLET | Freq: Three times a day (TID) | ORAL | Status: DC

## 2015-07-21 NOTE — Evaluation (Signed)
Physical Therapy Evaluation Patient Details Name: [name] MRN: [id number] DOB: 06/02/1960 Today's Date: 07/21/2015   History of Present Illness  55 y.o. female with a known history of Insulin-dependent diabetes mellitus, COPD on 3 L oxygen, hypertension, diabetic neuropathy, chronic low back pain and history of back surgeries presents to Hospital from home secondary to weakness, inability to stand and confusion.  Clinical Impression  Pt tired and somewhat distant but willing to participate with PT.  She is able to do some limited ambulation in the room but does become quite fatigued with the effort and overall shows some safety concerns w/o overt LOBs.  She was initially unsteady in standing and multiple time had to lean back on the bed (picking up the wheels of the walker) and did "fall" back onto the bed once before getting focused and using the walker appropriately.  She very much wants to go home instead of rehab, but realizes she needs to be functionally a little stronger and more capable if that is to happen. Pt's daughter is home 24/7 and she does not have to do steps to enter which helps the situation.     Follow Up Recommendations SNF (pt wanting to go home, may be able to per progress)    Equipment Recommendations       Recommendations for Other Services       Precautions / Restrictions Precautions Precautions: Fall Restrictions Weight Bearing Restrictions: No      Mobility  Bed Mobility Overal bed mobility: Modified Independent             General bed mobility comments: Pt needing hand rails and extra time to get to sitting, did not need direct assist  Transfers Overall transfer level: Needs assistance Equipment used: Rolling walker (2 wheeled) Transfers: Sit to/from Stand Sit to Stand: Min guard         General transfer comment: Pt was able to rise w/o direct assist.  After standing just a few seconds (using back of legs to stabilize) she loses  balance and "sits" back down on bed.   Ambulation/Gait Ambulation/Gait assistance: Min assist Ambulation Distance (Feet): 20 Feet Assistive device: Rolling walker (2 wheeled)       General Gait Details: Pt is able to do some limited ambulation and though she does not have any overt LOBs she is very slow, guarded and relatively limited with what she can do.  Pt does have a few stagger steps with backward walking but does not need direct assist to stay upright.  Her O2 remains in the 90s on 3 liters, her HR does increase from low 100s to 120.  Stairs            Wheelchair Mobility    Modified Rankin (Stroke Patients Only)       Balance Overall balance assessment: Modified Independent (pt with definite need of AD in standing)                                           Pertinent Vitals/Pain Pain Assessment: 0-10 Pain Score: 8  Pain Location: chronic LBP    Home Living Family/patient expects to be discharged to:: Private residence Living Arrangements: Children Available Help at Discharge: Family   Home Access: Level entry     Home Layout: One level Home Equipment: Walker - 2 wheels (O2) Additional Comments: Pt very much wanting to  return home, understands that dependent on her progress she may need rehab    Prior Function Level of Independence: Independent with assistive device(s) (reports she has walker, typically uses cane)         Comments: Pt reports that her chronic back pain has limited her functionally getting out of the house, etc.      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)           Lower Extremity Assessment: Overall WFL for tasks assessed;Generalized weakness (functional strength, though weaker than age expected levels)         Communication   Communication: No difficulties   Cognition Arousal/Alertness: Lethargic (reports she has not been sleeping much since son's death) Behavior During Therapy: WFL for tasks assessed/performed Overall Cognitive Status: Within Functional Limits for tasks assessed                      General Comments      Exercises        Assessment/Plan    PT Assessment Patient needs continued PT services  PT Diagnosis Difficulty walking;Generalized weakness   PT Problem List Decreased strength;Decreased range of motion;Decreased activity tolerance;Decreased balance;Decreased mobility;Decreased safety awareness;Pain  PT Treatment Interventions DME instruction;Gait training;Stair training;Functional mobility training;Therapeutic activities;Therapeutic exercise;Balance training;Neuromuscular re-education;Patient/family education   PT Goals (Current goals can be found in the Care Plan section) Acute Rehab PT Goals Patient Stated Goal: go home PT Goal Formulation: With patient Time For Goal Achievement: 08/04/15 Potential to Achieve Goals: Fair    Frequency Min 2X/week   Barriers to discharge        Co-evaluation               End of Session Equipment Utilized During Treatment: Gait belt;Oxygen (3 liters) Activity Tolerance: Patient limited by fatigue Patient left: with bed alarm set;with call bell/phone within reach           Time: 1218-1246 PT Time Calculation (min) (ACUTE ONLY): 28 min   Charges:   PT Evaluation $PT Eval Moderate Complexity: 1 Procedure     PT G Codes:        Malachi Pro, DPT  07/21/2015, 1:49 PM

## 2015-07-21 NOTE — Progress Notes (Signed)
PULMONARY / CRITICAL CARE MEDICINE   Name: [name] MRN: [id number] DOB: 05-04-1960    ADMISSION DATE:  07/17/2015 CONSULTATION DATE:  07/18/15  REFERRING MD:  Nemiah Commander  CHIEF COMPLAINT:  COPD Exacerbation  PT PROFILE: 26 F smoker admitted 06/13 by hospitalist service and transferred to ICU/SDU 06/14 AM for hypercarbia. Failed BiPAP and intubated AM 06/14  MAJOR EVENTS/TEST RESULTS: 06/13 admitted 06/14 transferred to ICU/SDU, failed BiPAP and intubated 06/16 Passed SBT. Extubated  INDWELLING DEVICES:: ETT 06/14 >> 06/16 L [place] CVL 06/14 >>   MICRO DATA: Urine 06/13 >> E coli MRSA PCR 06/14 >> NEG resp 06/14 >>   ANTIMICROBIALS:  Ceftriaxone 06/13 >>    SUBJECTIVE:  RASS 0, + F/C. Hypertensive with SBP>180 overnight. Moderate improvement with prn hydralazine and metoprolol. Was on lisinopril 2.5mg  daily at home. HR initially in the 130s but improved with IV metoprolol. Patient is very anxious and tearful due to the recent death of her son. Reports a wet but minimally productive cough. Flutter valve added IS. She denies chest pain, nausea, vomiting but reports mild dyspnea and back pain. She passed the bedside swallow eval and tolerating regular fluids.   VITAL SIGNS: BP 187/87 mmHg  Pulse 113  Temp(Src) 98.3 F (36.8 C) (Axillary)  Resp 30  Ht  (1.575 m)  Wt 177 lb 7.5 oz (80.5 kg)  BMI 32.45 kg/m2  SpO2 98%  HEMODYNAMICS:    VENTILATOR SETTINGS: Vent Mode:  [-] PSV;CPAP FiO2 (%):  [28 %-36 %] 36 % PEEP:  [5 cmH20] 5 cmH20 Pressure Support:  [5 cmH20-20 cmH20] 5 cmH20  INTAKE / OUTPUT: I/O last 3 completed shifts: In: 3038.6 [P.O.:250; I.V.:1318.6; NG/GT:920; IV Piggyback:550] Out: 4490 [Urine:4490]  PHYSICAL EXAMINATION: General: RASS 0, + F/C Neuro: CNs intact, MAEs HEENT: NCAT Cardiovascular: tachycardiac, S1/S2, no MRG Lungs: bilateral airflow, no wheezes, scattered rhonchi Abdomen: Soft, NT, + BS Ext: warm, no edema Psy: Tearful and  anxious  LABS:  BMET  Recent Labs Lab 07/18/15 1345 07/19/15 0440 07/20/15 0430  NA 141 138 141  K 3.7 3.6 2.9*  CL 109 104 109  CO2 BUN 32* 43* 49*  CREATININE 1.42* 1.59* 1.25*  GLUCOSE 174* 398* 153*    Electrolytes  Recent Labs Lab 07/18/15 1345 07/19/15 0440 07/20/15 0430  CALCIUM 8.1* 8.2* 8.1*    CBC  Recent Labs Lab 07/18/15 0553 07/19/15 0440 07/20/15 0430  WBC 12.6* 9.4 8.7  HGB 10.5* 8.8* 7.8*  HCT 31.8* 27.1* 23.1*  PLT 163 181 220    Coag's No results for input(s): APTT, INR in the last 168 hours.  Sepsis Markers No results for input(s): LATICACIDVEN, PROCALCITON, O2SATVEN in the last 168 hours.  ABG  Recent Labs Lab 07/18/15 0105 07/18/15 0700  PHART 7.28* 7.21*  PCO2ART 57* 68*  PO2ART 59* 68*    Liver Enzymes  Recent Labs Lab 07/17/15 1455 07/19/15 0440  AST 15 17  ALT 10* 13*  ALKPHOS 94 84  BILITOT 0.8 0.3  ALBUMIN 2.9* 2.2*    Cardiac Enzymes  Recent Labs Lab 07/17/15 1455  TROPONINI <0.03    Glucose  Recent Labs Lab 07/20/15 0624 07/20/15 0725 07/20/15 0828 07/20/15 1239 07/20/15 1529 07/20/15 2100  GLUCAP 113* 139* 117* 127* 104* 168*    Dg Chest Port 1 View  07/20/2015  CLINICAL DATA:  Respiratory failure. EXAM: PORTABLE CHEST 1 VIEW COMPARISON:  07/19/2015. FINDINGS: Endotracheal tube tip 1.4 cm above the carina. Proximal repositioning of  approximately 2 cm should be considered. NG tube noted with tip below left hemidiaphragm. Left subclavian line in stable position. Mediastinum hilar structures are unremarkable. Heart size stable. Persistent right mid lung and bibasilar subsegmental atelectasis and/or infiltrates with interim improvement from prior exam. Tiny left pleural effusion cannot be excluded. No pneumothorax. Prior cervical spine fusion. IMPRESSION: 1. Endotracheal tube tip 1.4 cm above the carina . Proximal repositioning of approximately 2 cm should be considered. NG tube and left  subclavian line stable position. 2. Persistent but improving right mid lung and bibasilar subsegmental atelectasis and/or infiltrates . Tiny left pleural effusion cannot be excluded. Electronically Signed   By: Maisie Fushomas  Register   On: 07/20/2015 07:39    Discussion: 55 year old female with hypertension, diabetes, COPD, chronic back pain, anxiety was hypoxic. Initially placed on BiPAP,. Patient very somnolent and required intubation and now mechanically ventilated  ASSESSMENT / PLAN:  PULMONARY A: Acute hypoxemic/hypercarbic respiratory failure-now s/p extubation; doing well COPD exacerbation  Smoker P:   Monitor in ICU post extubation Cont nebulized steroids and bronchodilators  CARDIOVASCULAR A Uncontrolled hypertension  Hx of hyperlipidemia P Hemodynamic per ICU protocol Resume lisinopril at 10mg  daily Start hydralazine 25mg  po q8h and hold for SBP<110 Continue prn metoprolol for HR>140  RENAL A:   AKI, nonoliguric - improving  Hyperkalemia, resolved Hypokalemia P:   Monitor BMET intermittently Monitor I/Os Correct electrolytes as indicated  GASTROINTESTINAL A:   No active issues P Oral intake as tolerated  HEMATOLOGICAL A Anemia without acute bleeding P:  DVT px: enoxaparin Monitor CBC intermittently Transfuse per usual guidelines  INFECTIOUS A:   E coli UTI Purulent bronchitis Leukocytosis, resolved P:   Monitor temp, WBC count Micro and abx as above Plan to complete 5 days of ceftriaxone  ENDOCRINE A:   DM2  Severe hyperglycemia exacerbated by systemic steroids  P:   Cont SSI - changed to ACHS 06/16 Cont Lantus  NEUROLOGIC A:   History of anxiety/depression-worsening symptoms due to bereavement Chronic pain P:   RASS goal: 0 PRN acetaminophen Resume home dose of prn percocet Resume home dose of xanax Continue elavil and trazodone [facility]  Magdalene S. Tukov ANP-BC Pulmonary and Critical Care Medicine [facility] Pager  [phone] or [phone]  PCCM ATTENDING ATTESTATION:  I have evaluated patient with ANP Luci Bankukov, reviewed database and care plan in its entirety  In addition to above: Resume Lantus - unclear why it was stopped Add HCT to Lisinopril Stop hydralazine as this is not a very good choice for long term mgmt of hypertension Mobilize Watch in SDU today - likely transfer to med-surg in AM 06/18   Billy Fischeravid Harvis Mabus, MD PCCM service Mobile [phone] Pager [phone]

## 2015-07-21 NOTE — Progress Notes (Signed)
eLink Physician-Brief Progress Note Patient Name: Teresa BastDeborah W Fields DOB: 1960/09/14 MRN: [id number]   Date of Service  07/21/2015  HPI/Events of Note  Hypomag  eICU Interventions  Mag replaced     Intervention Category Intermediate Interventions: Electrolyte abnormality - evaluation and management  DETERDING,ELIZABETH 07/21/2015, 6:38 AM

## 2015-07-22 ENCOUNTER — Inpatient Hospital Stay: Payer: Medicaid Other

## 2015-07-22 DIAGNOSIS — Z720 Tobacco use: Secondary | ICD-10-CM

## 2015-07-22 DIAGNOSIS — R000 Tachycardia, unspecified: Secondary | ICD-10-CM

## 2015-07-22 LAB — GLUCOSE, CAPILLARY
GLUCOSE-CAPILLARY: 176 mg/dL — AB (ref 65–99)
GLUCOSE-CAPILLARY: 278 mg/dL — AB (ref 65–99)
Glucose-Capillary: 181 mg/dL — ABNORMAL HIGH (ref 65–99)
Glucose-Capillary: 189 mg/dL — ABNORMAL HIGH (ref 65–99)

## 2015-07-22 LAB — CBC
HEMATOCRIT: 29.7 % — AB (ref 35.0–47.0)
HEMOGLOBIN: 9.9 g/dL — AB (ref 12.0–16.0)
MCH: 29.7 pg (ref 26.0–34.0)
MCHC: 33.4 g/dL (ref 32.0–36.0)
MCV: 89.1 fL (ref 80.0–100.0)
PLATELETS: 272 10*3/uL (ref 150–440)
RBC: 3.33 MIL/uL — AB (ref 3.80–5.20)
RDW: 14.3 % (ref 11.5–14.5)
WBC: 12.7 10*3/uL — AB (ref 3.6–11.0)

## 2015-07-22 LAB — BASIC METABOLIC PANEL
ANION GAP: 9 (ref 5–15)
BUN: 23 mg/dL — ABNORMAL HIGH (ref 6–20)
CHLORIDE: 101 mmol/L (ref 101–111)
CO2: 29 mmol/L (ref 22–32)
Calcium: 8.4 mg/dL — ABNORMAL LOW (ref 8.9–10.3)
Creatinine, Ser: 0.94 mg/dL (ref 0.44–1.00)
GFR calc Af Amer: >60 mL/min (ref >60)
GLUCOSE: 172 mg/dL — AB (ref 65–99)
POTASSIUM: 4.4 mmol/L (ref 3.5–5.1)
Sodium: 139 mmol/L (ref 135–145)

## 2015-07-22 LAB — PHOSPHORUS: Phosphorus: 4.5 mg/dL (ref 2.5–4.6)

## 2015-07-22 LAB — MAGNESIUM: Magnesium: 2.2 mg/dL (ref 1.7–2.4)

## 2015-07-22 MED ORDER — LISINOPRIL 5 MG PO TABS
5.0000 mg | ORAL_TABLET | Freq: Every day | ORAL | Status: DC
  Administered 2015-07-22 – 2015-07-23 (×2): 5 mg via ORAL
  Filled 2015-07-22 (×2): qty 1

## 2015-07-22 MED ORDER — FLUTICASONE FUROATE-VILANTEROL 100-25 MCG/INH IN AEPB
1.0000 | INHALATION_SPRAY | Freq: Every day | RESPIRATORY_TRACT | Status: DC
  Administered 2015-07-22 – 2015-07-23 (×2): 1 via RESPIRATORY_TRACT
  Filled 2015-07-22: qty 28

## 2015-07-22 MED ORDER — HYDROCHLOROTHIAZIDE 12.5 MG PO CAPS
12.5000 mg | ORAL_CAPSULE | Freq: Every day | ORAL | Status: DC
  Administered 2015-07-22 – 2015-07-23 (×2): 12.5 mg via ORAL
  Filled 2015-07-22 (×4): qty 1

## 2015-07-22 MED ORDER — ATENOLOL 12.5 MG HALF TABLET
12.5000 mg | ORAL_TABLET | Freq: Two times a day (BID) | ORAL | Status: DC
  Administered 2015-07-22 – 2015-07-23 (×2): 12.5 mg via ORAL
  Filled 2015-07-22 (×5): qty 1

## 2015-07-22 MED ORDER — TIOTROPIUM BROMIDE MONOHYDRATE 18 MCG IN CAPS
18.0000 ug | ORAL_CAPSULE | Freq: Every day | RESPIRATORY_TRACT | Status: DC
  Administered 2015-07-22 – 2015-07-23 (×2): 18 ug via RESPIRATORY_TRACT
  Filled 2015-07-22: qty 5

## 2015-07-22 MED ORDER — ALBUTEROL SULFATE (2.5 MG/3ML) 0.083% IN NEBU: 2.5000 mg | INHALATION_SOLUTION | RESPIRATORY_TRACT | Status: DC | PRN

## 2015-07-22 NOTE — Progress Notes (Signed)
PULMONARY / CRITICAL CARE MEDICINE   Name: Teresa BastDeborah W Edelman MRN: [id number] DOB: [birth year]    ADMISSION DATE:  07/17/2015 CONSULTATION DATE:  07/18/15  REFERRING MD:  Nemiah CommanderKalisetti  CHIEF COMPLAINT:  COPD Exacerbation  PT PROFILE: [age] F smoker admitted 06/13 by hospitalist service and transferred to ICU/SDU 06/14 AM for hypercarbia. Failed BiPAP and intubated AM 06/14  MAJOR EVENTS/TEST RESULTS: 06/13 admitted 06/14 transferred to ICU/SDU, failed BiPAP and intubated 06/16 Passed SBT. Extubated  INDWELLING DEVICES:: ETT 06/14 >> 06/16 L [place] CVL 06/14 >>   MICRO DATA: Urine 06/13 >> E coli MRSA PCR 06/14 >> NEG resp 06/14 >>   ANTIMICROBIALS:  Ceftriaxone 06/13 >>    SUBJECTIVE:  Patient slept all night. Blood pressure is much improved. Still mildly tachycardic with heart rates in the 110s to 120s. Tolerating nasal cannula at 3 L/m with oxygen saturation in the 100s.  VITAL SIGNS: BP 124/90 mmHg  Pulse 129  Temp(Src) 98.6 F (37 C) (Oral)  Resp 17  Ht 5\' 2"  (1.575 m)  Wt 177 lb 7.5 oz (80.5 kg)  BMI 32.45 kg/m2  SpO2 96%  HEMODYNAMICS:    VENTILATOR SETTINGS:    INTAKE / OUTPUT: I/O last 3 completed shifts: In: -  Out: 3525 [Urine:3200; Stool:325]  PHYSICAL EXAMINATION: General: Sleeping, no acute distress Neuro: CNs intact, MAEs HEENT: NCAT Cardiovascular: tachycardiac, S1/S2, no MRG Lungs: bilateral airflow, no wheezes, scattered rhonchi Abdomen: Soft, NT, + BS Ext: warm, no edema Psy: Tearful and anxious  LABS:  BMET  Recent Labs Lab 07/20/15 0430 07/21/15 0600 07/22/15 0605  NA 141 141 139  K 2.9* 4.4 4.4  CL 109 105 101  CO2 25 27 29   BUN 49* 24* 23*  CREATININE 1.25* 0.95 0.94  GLUCOSE 153* 242* 172*    Electrolytes  Recent Labs Lab 07/20/15 0430 07/21/15 0600 07/22/15 0605  CALCIUM 8.1* 8.6* 8.4*  MG  --  1.5* 2.2  PHOS  --  3.3 4.5    CBC  Recent Labs Lab 07/20/15 0430 07/21/15 0600 07/22/15 0605  WBC 8.7 13.3*  12.7*  HGB 7.8* 10.0* 9.9*  HCT 23.1* 30.5* 29.7*  PLT 220 270 272    Coag's No results for input(s): APTT, INR in the last 168 hours.  Sepsis Markers No results for input(s): LATICACIDVEN, PROCALCITON, O2SATVEN in the last 168 hours.  ABG  Recent Labs Lab 07/18/15 0105 07/18/15 0700  PHART 7.28* 7.21*  PCO2ART 57* 68*  PO2ART 59* 68*    Liver Enzymes  Recent Labs Lab 07/17/15 1455 07/19/15 0440  AST 15 17  ALT 10* 13*  ALKPHOS 94 84  BILITOT 0.8 0.3  ALBUMIN 2.9* 2.2*    Cardiac Enzymes  Recent Labs Lab 07/17/15 1455  TROPONINI <0.03    Glucose  Recent Labs Lab 07/20/15 2100 07/21/15 0722 07/21/15 0833 07/21/15 1130 07/21/15 1653 07/21/15 2301  GLUCAP 168* 239* 273* 216* 141* 163*    No results found.  Discussion: 55 year old female with hypertension, diabetes, COPD, chronic back pain, anxiety was hypoxic. Initially placed on BiPAP,. Patient very somnolent and required intubation and now mechanically ventilated  ASSESSMENT / PLAN:  PULMONARY A: Acute hypoxemic/hypercarbic respiratory failure-now s/p extubation; doing well COPD exacerbation  Smoker P:   -Cont nebulized steroids and bronchodilators -Chest x-ray when necessary -Continue incentive spirometry and flutter valve  CARDIOVASCULAR A Uncontrolled hypertension-blood pressure improved  Hx of hyperlipidemia P Hemodynamic per ICU protocol Continue lisinopril at 10mg  daily Hydralazine when necessary for systolic  blood pressure greater than 170  Continue prn metoprolol for HR>140 Hydrochlorothiazide 25 mg daily  RENAL A:   AKI, nonoliguric - resolved Hypokalemia-resolved P:   Monitor BMET intermittently Monitor I/Os Correct electrolytes as indicated  GASTROINTESTINAL A:   No active issues P Oral intake as tolerated  HEMATOLOGICAL A Anemia without acute bleeding P:  DVT px: enoxaparin Monitor CBC intermittently Transfuse per usual guidelines  INFECTIOUS A:    E coli UTI Purulent bronchitis Leukocytosis-Improving P:   Monitor temp, WBC count Micro and abx as above Ceftriaxone completed. Stop  ENDOCRINE A:   DM2  Severe hyperglycemia exacerbated by systemic steroids  P:   Cont SSI - changed to ACHS 06/16 Cont Lantus  NEUROLOGIC A:   History of anxiety/depression-worsening symptoms due to bereavement Chronic pain P:   RASS goal: 0 PRN acetaminophen Continue home dose of prn percocet Continue home dose of xanax Continue elavil and trazodone at HS  Okay to transfer to medical surgical floor. Dr. Sheryle Hail from hospitalist team updated   Total CCM time is 35 minutes  Magdalene S. [facility] ANP-BC Pulmonary and Critical Care Medicine [facility] Pager [phone] or [phone] Pager [phone]  PCCM ATTENDING ATTESTATION:  I have evaluated patient with ANP Luci Bank, reviewed database in its entirety and discussed care plan in detail.   Important exam findings: Flat affect No resp distress No wheezes Tachy, reg, no M NABS No edema  Major problems addressed by PCCM team: Acute on chronic respiratory failure AECOPD - resolving Smoker Hypertension - improved after initiation of HCTZ and resumption of lisinopril 06/16 Sinus tachycardia Deconditioning   PLAN/REC: Transition back to home inhaler medications Will need assessment of O2 needs prior to discharge Counseled again re: smoking cessation Low dose atenolol initiated 06/18 Transfer to med-surg with cardiac monitoring Cont PT - might need rehab after discharge Likely DC in next day or two   Billy Fischer, MD PCCM service Mobile [phone] Pager [phone]

## 2015-07-22 NOTE — Progress Notes (Signed)
Patient is tearful and expressed she is "crying because tomorrow will have been 1 week since son died". Emotional support was given at this time. Will continue to monitor.

## 2015-07-23 ENCOUNTER — Telehealth: Payer: Self-pay | Admitting: *Deleted

## 2015-07-23 DIAGNOSIS — Z634 Disappearance and death of family member: Secondary | ICD-10-CM

## 2015-07-23 DIAGNOSIS — F4321 Adjustment disorder with depressed mood: Secondary | ICD-10-CM

## 2015-07-23 DIAGNOSIS — J9600 Acute respiratory failure, unspecified whether with hypoxia or hypercapnia: Secondary | ICD-10-CM

## 2015-07-23 LAB — GLUCOSE, CAPILLARY
GLUCOSE-CAPILLARY: 192 mg/dL — AB (ref 65–99)
Glucose-Capillary: 261 mg/dL — ABNORMAL HIGH (ref 65–99)
Glucose-Capillary: 175 mg/dL — ABNORMAL HIGH (ref 65–99)

## 2015-07-23 NOTE — Progress Notes (Signed)
1730 d/C home with family and O2 on at 3L nasal cannula

## 2015-07-23 NOTE — Progress Notes (Signed)
1510 D/C teaching done. Questions answered.

## 2015-07-23 NOTE — Progress Notes (Signed)
Physical Therapy Treatment Patient Details Name: [name] MRN: [id number] DOB: 03/07/1960 Today's Date: 07/23/2015    History of Present Illness 55 y.o. female with a known history of Insulin-dependent diabetes mellitus, COPD on 3 L oxygen, hypertension, diabetic neuropathy, chronic low back pain and history of back surgeries presents to Hospital from home secondary to weakness, inability to stand and confusion.    PT Comments    Pt agreeable to PT. No voiced complaints; however, pt does become tearful a couple of times during session due to recently losing her son (1 week ago). Pt participates in seated and stand exercises well with rest breaks as needed; HR and O2 saturation remain in safe levels, but respirations increased too high (32-37) a few times. Seated rest with focus on slow controlled breathing resolve easily. Pt demonstrates improved distance and quality of ambulation in room up to 100 ft with supervision. No balance deficits. Pt receives up in chair comfortably. Pt wishes to return home; recommendation at this visit home with HHPT . Recommend rolling walker and shower chair/tub bench for improved safety in the home. Discuss with SW/CM.   Follow Up Recommendations  Home health PT     Equipment Recommendations  Rolling walker with 5" wheels (pt only has rollator; cumbersome. tuib bench )    Recommendations for Other Services       Precautions / Restrictions Restrictions Weight Bearing Restrictions: No    Mobility  Bed Mobility Overal bed mobility: Modified Independent             General bed mobility comments: increased time  Transfers Overall transfer level: Modified independent Equipment used: Rolling walker (2 wheeled) Transfers: Sit to/from Stand Sit to Stand: Supervision         General transfer comment: Cues for hand placement with sit; otherwise safe  Ambulation/Gait Ambulation/Gait assistance: Supervision Ambulation Distance (Feet): 100  Feet Assistive device: Rolling walker (2 wheeled) Gait Pattern/deviations: Step-through pattern;WFL(Within Functional Limits)     General Gait Details: Subjectively ambulation at baseline. O2 sats remain above 95% on 2L   Stairs            Wheelchair Mobility    Modified Rankin (Stroke Patients Only)       Balance Overall balance assessment: Modified Independent                                  Cognition Arousal/Alertness: Awake/alert Behavior During Therapy: WFL for tasks assessed/performed (tearful due to loss of son recently) Overall Cognitive Status: Within Functional Limits for tasks assessed                      Exercises General Exercises - Lower Extremity Long Arc Quad: Strengthening;Both;20 reps;Seated Hip ABduction/ADduction: Strengthening;Both;20 reps;Standing Straight Leg Raises: Strengthening;Both;20 reps;Standing Hip Flexion/Marching: Strengthening;Both;20 reps;Standing Heel Raises: Strengthening;Both;20 reps;Standing Mini-Sqauts: Strengthening;Both;10 reps;Standing (2 sets) Other Exercises Other Exercises: B hip ext 20x stand Other Exercises: B ham curl 20x stand    General Comments        Pertinent Vitals/Pain Pain Assessment: No/denies pain    Home Living                      Prior Function            PT Goals (current goals can now be found in the care plan section) Progress towards PT goals: Progressing toward goals    Frequency  Min 2X/week    PT Plan Discharge plan needs to be updated    Co-evaluation             End of Session Equipment Utilized During Treatment: Oxygen Activity Tolerance: Patient tolerated treatment well Patient left: in chair;with call bell/phone within reach     Time: 1100-1136 PT Time Calculation (min) (ACUTE ONLY): 36 min  Charges:  $Gait Training: 8-22 mins $Therapeutic Exercise: 8-22 mins                    G Codes:      Kristeen MissHeidi Elizabeth Bishop,  PTA 07/23/2015, 12:57 PM

## 2015-07-23 NOTE — Discharge Instructions (Signed)
Acute Respiratory Distress Syndrome °Acute respiratory distress syndrome is a life-threatening condition in which fluid collects in the lungs. This condition can cause severe shortness of breath and low oxygen levels in the blood. It can also cause the lungs and other vital organs to fail. The condition usually develops within 24 to 48 hours of an infection, illness, surgery, or injury. °CAUSES °This condition may be caused by: °· An infection, such as sepsis or pneumonia. °· A serious injury to the head or chest. °· A major surgery. °· A drug overdose. °· Breathing in harmful chemicals or smoke. °· Blood transfusions. °· A blood clot in the lungs. °SYMPTOMS °Symptoms of this condition include: °· Fever. °· Difficulty breathing. °· Bluish skin (cyanosis). °· A fast or irregular heartbeat. °· Low blood pressure (hypotension). °· Agitation. °· Confusion. °· Lack of energy (lethargy). °· Sweating. °DIAGNOSIS °This condition may be diagnosed by using tests to rule out other diseases and conditions that cause similar symptoms. You may have: °· A chest X-ray or CT scan. °· Blood tests. °· A sputum culture. In this test, you will be asked to spit so that a sample of lung fluid can be taken for testing. °· Bronchoscopy. During this test a thin, flexible tool is passed into the mouth or nose, down the windpipe, and into the lungs. °TREATMENT °This condition may be treated with: °· Oxygen. A breathing machine (ventilator) is often used to provide oxygen and help with breathing. °· Medicine to help you relax (sedative). °· Fluids and nutrients given through an IV tube. °· Blood pressure medicine. °· Steroid medicine to help decrease inflammation in the lungs. °· Diuretic medicine to get rid of extra fluid in the body. °Additional treatment may be needed, depending on the cause of the condition. It can take up to 12 months to recover from this condition. Some people recover fully, but others may continue to  have: °· Weakness. °· Shortness of breath. °· Memory problems. °· Depression. °HOME CARE INSTRUCTIONS °Until you recover from this condition: °· Do not smoke. °· Limit alcohol intake to no more than 1 drink per day for nonpregnant women and 2 drinks per day for men. One drink equals 12 oz of beer, 5 oz of wine, or 1½ oz of hard liquor. °· Ask friends and family to help you if daily activities make you tired. °SEEK MEDICAL CARE IF: °· You become short of breath with activity or while resting. °· You develop a cough that does not go away. °· You have a fever. °SEEK IMMEDIATE MEDICAL CARE IF: °· You have sudden shortness of breath. °· You develop chest pain that does not go away. °· You develop swelling or pain in one of your legs. °· You cough up blood. °  °This information is not intended to replace advice given to you by your health care provider. Make sure you discuss any questions you have with your health care provider. °  °Document Released: 01/20/2005 Document Revised: 06/06/2014 Document Reviewed: 01/16/2014 °Elsevier Interactive Patient Education ©2016 [employer] ° °

## 2015-07-23 NOTE — Progress Notes (Signed)
PULMONARY / CRITICAL CARE MEDICINE   Name: Teresa BastDeborah W Brackney MRN: [id number] DOB: 1961/01/27    ADMISSION DATE:  07/17/2015 CONSULTATION DATE:  07/18/15  REFERRING MD:  Nemiah CommanderKalisetti  CHIEF COMPLAINT:  COPD Exacerbation  PT PROFILE: [age] F smoker admitted 06/13 by hospitalist service and transferred to ICU/SDU 06/14 AM for hypercarbia. Failed BiPAP and intubated AM 06/14  MAJOR EVENTS/TEST RESULTS: 06/13 admitted 06/14 transferred to ICU/SDU, failed BiPAP and intubated 06/16 Passed SBT. Extubated  INDWELLING DEVICES:: ETT 06/14 >> 06/16 L [place] CVL 06/14 >>   MICRO DATA: Urine 06/13 >> E coli MRSA PCR 06/14 >> NEG resp 06/14 >>   ANTIMICROBIALS:  Ceftriaxone 06/13 >>    SUBJECTIVE:  No acute issues overnight. Patient slept all night. On minimal oxygen   VITAL SIGNS: BP 91/48 mmHg  Pulse 65  Temp(Src) 98.3 F (36.8 C) (Oral)  Resp 13  Ht 5\' 2"  (1.575 m)  Wt 177 lb 7.5 oz (80.5 kg)  BMI 32.45 kg/m2  SpO2 100%      INTAKE / OUTPUT: I/O last 3 completed shifts: In: -  Out: 1575 [Urine:1250; Stool:325]  PHYSICAL EXAMINATION: General: Fully awake, no acute distress Neuro: CNs intact, MAEs HEENT: NCAT Cardiovascular: tachycardiac, S1/S2, no MRG Lungs: bilateral airflow, no wheezes, scattered rhonchi Abdomen: Soft, NT, + BS Ext: warm, no edema Psy: Tearful and anxious  LABS:  BMET  Recent Labs Lab 07/20/15 0430 07/21/15 0600 07/22/15 0605  NA 141 141 139  K 2.9* 4.4 4.4  CL 109 105 101  CO2 25 27 29   BUN 49* 24* 23*  CREATININE 1.25* 0.95 0.94  GLUCOSE 153* 242* 172*    Electrolytes  Recent Labs Lab 07/20/15 0430 07/21/15 0600 07/22/15 0605  CALCIUM 8.1* 8.6* 8.4*  MG  --  1.5* 2.2  PHOS  --  3.3 4.5    CBC  Recent Labs Lab 07/20/15 0430 07/21/15 0600 07/22/15 0605  WBC 8.7 13.3* 12.7*  HGB 7.8* 10.0* 9.9*  HCT 23.1* 30.5* 29.7*  PLT 220 270 272    Coag's No results for input(s): APTT, INR in the last 168 hours.  Sepsis  Markers No results for input(s): LATICACIDVEN, PROCALCITON, O2SATVEN in the last 168 hours.  ABG  Recent Labs Lab 07/18/15 0105 07/18/15 0700  PHART 7.28* 7.21*  PCO2ART 57* 68*  PO2ART 59* 68*    Liver Enzymes  Recent Labs Lab 07/17/15 1455 07/19/15 0440  AST 15 17  ALT 10* 13*  ALKPHOS 94 84  BILITOT 0.8 0.3  ALBUMIN 2.9* 2.2*    Cardiac Enzymes  Recent Labs Lab 07/17/15 1455  TROPONINI <0.03    Glucose  Recent Labs Lab 07/21/15 1653 07/21/15 2301 07/22/15 0729 07/22/15 1137 07/22/15 1609 07/22/15 2146  GLUCAP 141* 163* 176* 278* 181* 189*    No results found.  Discussion: 55 year old female with hypertension, diabetes, COPD, chronic back pain, anxiety was hypoxic. Initially placed on BiPAP,. Patient very somnolent and required intubation and  mechanically ventilated, s/p extubation  ASSESSMENT / PLAN:  PULMONARY A: Acute hypoxemic/hypercarbic respiratory failure-now s/p extubation; doing well COPD exacerbation  Smoker P:   -Cont nebulized steroids and bronchodilators -Chest x-ray when necessary -Continue incentive spirometry and flutter valve - Will need appointment with pulmonologist prior to discharge -Transition back to home inhaler medications  CARDIOVASCULAR A Uncontrolled hypertension-blood pressure improved  Hx of hyperlipidemia P Continue lisinopril at 10mg  daily Hydralazine when necessary for systolic blood pressure greater than 170  Continue prn metoprolol for HR>140  Hydrochlorothiazide 25 mg daily  RENAL A:   AKI, nonoliguric - resolved Hypokalemia-resolved P:   Monitor BMET intermittently Monitor I/Os Correct electrolytes as indicated  GASTROINTESTINAL A:   No active issues Advance diet as tolerated  HEMATOLOGICAL A Anemia without acute bleeding P:  DVT px: enoxaparin Monitor CBC intermittently Transfuse per usual guidelines  INFECTIOUS A:   E coli UTI Purulent bronchitis Leukocytosis-Improving P:    Monitor temp, WBC count Micro and abx as above Ceftriaxone completed-Completed  ENDOCRINE A:   DM2  Severe hyperglycemia exacerbated by systemic steroids  P:   Cont SSI - changed to ACHS 06/16 Cont Lantus  NEUROLOGIC A:   History of anxiety/depression-worsening symptoms due to bereavement Chronic pain P:   RASS goal: 0 PRN acetaminophen Continue home dose of prn percocet Continue home dose of xanax Continue elavil and trazodone at HS Okay to transfer to medical surgical floor. Dr. Sheryle Hail from hospitalist team updated  PT/OT Psych consult   Magdalene S. [facility] ANP-BC Pulmonary and Critical Care Medicine [facility] Pager [phone] or [phone] Pager [phone]   STAFF NOTE: I, Dr. Lucie Leather,  have personally reviewed patient's available data, including medical history, events of note, physical examination and test results as part of my evaluation. I have discussed with NP and other care providers such as pharmacist, RN and RRT.  In addition,  I personally evaluated patient and elicited key findings   +distant BS, no wheezes  A:resp resolved  P:  transfer to gen med floor, psych consult    The Rest per NP whose note is outlined above and that I agree with    The Patient requires high complexity decision making for assessment and support, frequent evaluation and titration of therapies, application of advanced monitoring technologies and extensive interpretation of multiple databases. This Critical care time does not reflrect procedure time or supervisory time of NP but could involve care discussion time    Lucie Leather, M.D.  [facility]  Medical Director [facility] Medical Director [facility] Cardio-Pulmonary Department

## 2015-07-23 NOTE — Care Management (Signed)
Patient to be discharged to home today. Met with patient. She lives at home and her 2 daughters live with her. They assist with her care and any transportation needs. PCP is Dr. Florene Glen at [facility]. Patient last seen in the past 30 days. She denies issues obtaining medications or copays. Her O2 is from [facility] [facility]. Her daughter's will be getting her a portable tank for transport home.  Offered support. No discharge needs identified.

## 2015-07-23 NOTE — Telephone Encounter (Signed)
-----   Message from Erin FullingKurian Kasa, MD sent at 07/23/2015  1:41 PM EDT ----- Hi Can you set up hospital follow up with Dr Sung AmabileSimonds Follow up for COPD exacerbation  Thanks

## 2015-07-23 NOTE — Care Management (Signed)
COPD on chronic 3L. Transferred to the ICU. Failed Bipap, intubated 06/14, extubated 06/16.  Patient's son died last week from drug overdose. RNCM attempted assessment. Patient had multiple visitors at bedside. Will attempt at a later time. PT recommending home health PT. Medicaid does not cover this service.

## 2015-07-23 NOTE — Discharge Summary (Signed)
Physician Discharge Summary  Patient ID: [name] MRN: [id number] DOB/AGE: [birth year] 55 y.o.  Admit date: 07/17/2015 Discharge date: 07/23/2015    Discharge Diagnoses:  Acute hypoxemic/ hypercarbic respiratory failure due to COPD exacerbation. Failed BiPAP and intubated. Extubated on 6/16.                                                                      DISCHARGE PLAN BY DIAGNOSIS    COPD exacerbation  Smoker Uncontrolled hypertension hyperlipidemia DM2     Cont nebulized steroids and bronchodilators Smoking cessation Resume antihypertensives, home dose Continue lipitor Smoking cessation councelling              DISCHARGE SUMMARY   [name] is a 55 year old female with known history history of diabetes mellitus, diabetic neuropathy, hypertension, chronic low back pain, multiple back surgeries in the past, COPD, uses 3 L of oxygen at home, tobacco abuse. Patient presented to [facility] ED with chronic back pain and confusion. The patient has had her son passed away recently from an overdose and states that she has been neglectful of her medication over the last several days. Patient was hypoxic in the ER. Patient was started on antibiotics for UTI. Patient was admitted to 1c then became more somnolent and was transferred to the ICU on BiPAP. Patient continued to be very somnolent therefore decided to intubate the patient for airway protection.  Patient was extubated on 6/14, and has been doing well since then.  Patient is currently on 3l of oxygen which is her baseline.                SIGNIFICANT DIAGNOSTIC STUDIES 06/13 admitted 06/14 transferred to ICU/SDU, failed BiPAP and intubated 06/16 Passed SBT. Extubated  SIGNIFICANT EVENTS 6/14>54 F smoker admitted 06/13 by hospitalist service and transferred to ICU/SDU 06/14 AM for hypercarbia. Failed BiPAP and intubated AM 06/14  MICRO DATA  6/13 urine culture> ECOLI 6/14 Sputum  culture> negative ANTIBIOTICS 6/13 ceftriaxo>>6/18  CONSULTS 6/14 PCCM team>  TUBES / LINES 6/14 ET tube>>6/16 6/14 left subclavian TLC   Discharge Exam: General: Fully awake, no acute distress Neuro: CNs intact, MAEs HEENT: NCAT Cardiovascular: tachycardiac, S1/S2, no MRG Lungs: bilateral airflow, no wheezes, scattered rhonchi Abdomen: Soft, NT, + BS Ext: warm, no edema Psy: Tearful and anxious  Filed Vitals:   07/23/15 0400 07/23/15 0500 07/23/15 0600 07/23/15 0800  BP: 91/46 120/50 91/48 110/50  Pulse: 65 63 65 80  Temp: 98.3 F (36.8 C)   98.1 F (36.7 C)  TempSrc: Oral     Resp: _0 Height:      Weight:      SpO2: 97% 99% 100% 95%     Discharge Labs  BMET  Recent Labs Lab 07/18/15 1345 07/19/15 0440 07/20/15 0430 07/21/15 0600 07/22/15 0605  NA 141 138 141 141 139  K 3.7 3.6 2.9* 4.4 4.4  CL 109 104 109 105 101  CO2 _1 GLUCOSE 174* 398* 153* 242* 172*  BUN 32* 43* 49* 24* 23*  CREATININE 1.42* 1.59* 1.25* 0.95 0.94  CALCIUM 8.1* 8.2* 8.1* 8.6* 8.4*  MG  --   --   --  1.5* 2.2  PHOS  --   --   --  3.3 4.5    CBC  Recent Labs Lab 07/20/15 0430 07/21/15 0600 07/22/15 0605  HGB 7.8* 10.0* 9.9*  HCT 23.1* 30.5* 29.7*  WBC 8.7 13.3* 12.7*  PLT 220 270 272    Anti-Coagulation No results for input(s): INR in the last 168 hours.      Discharge Instructions    Call MD for:  difficulty breathing, headache or visual disturbances    Complete by:  As directed      Call MD for:  temperature >100.4    Complete by:  As directed      Diet - low sodium heart healthy    Complete by:  As directed      Discharge instructions    Complete by:  As directed   Smoking cessation councelling     Increase activity slowly    Complete by:  As directed                  Medication List    TAKE these medications        albuterol (2.5 MG/3ML) 0.083% nebulizer solution  Commonly known as:  PROVENTIL  Take 2.5 mg by  nebulization every 6 (six) hours as needed for wheezing or shortness of breath.     albuterol-ipratropium 18-103 MCG/ACT inhaler  Commonly known as:  COMBIVENT  Inhale 1 puff into the lungs every 6 (six) hours as needed for wheezing or shortness of breath.     ALPRAZolam 1 MG tablet  Commonly known as:  XANAX  Take 1 mg by mouth 4 (four) times daily as needed for anxiety.     amitriptyline 25 MG tablet  Commonly known as:  ELAVIL  Take 75 mg by mouth at bedtime.     atorvastatin 40 MG tablet  Commonly known as:  LIPITOR  Take 40 mg by mouth at bedtime.     fluticasone 50 MCG/ACT nasal spray  Commonly known as:  FLONASE  Place 2 sprays into both nostrils daily as needed for rhinitis.      ASK your doctor about these medications        Fluticasone-Salmeterol 250-50 MCG/DOSE Aepb  Commonly known as:  ADVAIR  Inhale 1 puff into the lungs 2 (two) times daily.     gabapentin 300 MG capsule  Commonly known as:  NEURONTIN  Take 1,200 mg by mouth 3 (three) times daily.     insulin glargine 100 unit/mL Sopn  Commonly known as:  LANTUS  Inject 72 Units into the skin at bedtime.     lisinopril 2.5 MG tablet  Commonly known as:  PRINIVIL,ZESTRIL  Take 2.5 mg by mouth daily.     magnesium oxide 400 (241.3 Mg) MG tablet  Commonly known as:  MAG-OX  Take 400 mg by mouth at bedtime.     NOVOLOG FLEXPEN 100 UNIT/ML FlexPen  Generic drug:  insulin aspart  Inject 10 Units into the skin 3 (three) times daily before meals.     oxyCODONE-acetaminophen 7.5-325 MG tablet  Commonly known as:  PERCOCET  Take 1 tablet by mouth every 4 (four) hours as needed for severe pain.     tiotropium 18 MCG inhalation capsule  Commonly known as:  SPIRIVA  Place 18 mcg into inhaler and inhale daily.     traZODone 100 MG tablet  Commonly known as:  DESYREL  Take 200 mg by mouth at bedtime.  Disposition: Home  Discharged Condition: [name] has met maximum benefit of inpatient  care and is medically stable and cleared for discharge.  Patient is pending follow up as above.      Time spent on disposition:  Greater than 45 minutes.    [facility]

## 2015-07-23 NOTE — Telephone Encounter (Signed)
hosp f/u appt given to [name] in ICU for 7/3 @10 :30am. Nothing further needed.

## 2015-07-23 NOTE — Consult Note (Signed)
[facility] Consult   Reason for Consult:  Consult for 55 year old woman in the hospital with respiratory failure and out-of-control diabetes. Concern about her mood and its impact on her health. Referring Physician:  Bridgett Larsson Patient Identification: [name] MRN:  [id number] Principal Diagnosis: Grief at loss of child Diagnosis:   Patient Active Problem List   Diagnosis Date Noted  . Grief at loss of child [F43.21] 07/23/2015  . Acute respiratory failure ([place]) [J96.00]   . COPD exacerbation ([place]) [J44.1] 07/17/2015    Total Time spent with patient: 1 hour  Subjective:   [name] is a 55 y.o. female patient admitted with "my son died".  HPI:  Patient interviewed. Spoke with nursing staff. Chart reviewed. 55 year old woman came into the hospital with respiratory failure thought to be secondary to having stopped using her oxygen. Patient tells me that she thought she was in the hospital because of a diabetic coma. She acknowledges however that she had also been having trouble with her breathing. She lost her son exactly 1 week ago. Adult son died of an overdose of narcotics. Unknown whether or not it was intentional. He died in her home and she was one of the people who found him. She is having appropriate sadness and grief feelings. Feels overwhelmed and guilty at times. Difficulty sleeping and eating at times. She admits that she had not been paying close attention to her health. She denies however that she was having any suicidal intent or plan. Denies that she's having any hallucinations or psychotic symptoms. She has been compliant with her prescribed outpatient medicine.  Social history: Patient lives with her daughter. She is not able to work outside the home. Son had been living at least part time there before he passed away. Recent death of a son just 1 week ago.  Medical history: Patient has COPD chronic respiratory failure chronic oxygen dependency diabetes  chronic pain  Substance abuse history: She denies that she drinks or abuses any drugs. Has been on chronic Xanax but says she does not abuse it.  Past Psychiatric History: She has seen a psychiatrist in [place] for years. She is on Xanax chronically. Denies any other hospitalization denies any suicide attempts. No history of psychosis or mania. She says that she was prescribed antidepressants one or 2 times in the past but they made her hallucinate.  Risk to Self: Is patient at risk for suicide?: No Risk to Others:   Prior Inpatient Therapy:   Prior Outpatient Therapy:    Past Medical History:  Past Medical History  Diagnosis Date  . 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 Hypertension   . COPD (chronic obstructive pulmonary disease) ([place])     on 3L home o2  . Asthma   . Hyperlipidemia   . Insomnia   . Coarse tremors   . Anxiety     Past Surgical History  Procedure Laterality Date  . Back surgery      x 2 lumbar spine  . Foot surgery    . Bronchoscopy    . Cervical spine surgery    . Cholecystectomy     Family History:  Family History  Problem Relation Age of Onset  . CAD Mother   . Hypertension Mother   . Throat cancer Father    Family Psychiatric  History: Positive family history of anxiety depression and substance abuse Social History:  History  Alcohol Use No  History  Drug Use No    Social History   Social History  . Marital Status: Single    Spouse Name: N/A  . Number of Children: N/A  . Years of Education: N/A   Social History Main Topics  . Smoking status: Current Every Day Smoker -- 0.50 packs/day    Types: Cigarettes  . Smokeless tobacco: None  . Alcohol Use: No  . Drug Use: No  . Sexual Activity: Not Asked   Other Topics Concern  . None   Social History Narrative   Lives with daughter, has a walker.   Additional Social History:    Allergies:  No Known Allergies  Labs:  Results for orders placed  or performed during the hospital encounter of 07/17/15 (from the past 48 hour(s))  Glucose, capillary     Status: Abnormal   Collection Time: 07/21/15 11:01 PM  Result Value Ref Range   Glucose-Capillary 163 (H) 65 - 99 mg/dL  CBC     Status: Abnormal   Collection Time: 07/22/15  6:05 AM  Result Value Ref Range   WBC 12.7 (H) 3.6 - 11.0 K/uL   RBC 3.33 (L) 3.80 - 5.20 MIL/uL   Hemoglobin 9.9 (L) 12.0 - 16.0 g/dL   HCT 29.7 (L) 35.0 - 47.0 %   MCV 89.1 80.0 - 100.0 fL   MCH 29.7 26.0 - 34.0 pg   MCHC 33.4 32.0 - 36.0 g/dL   RDW 14.3 11.5 - 14.5 %   Platelets 272 150 - 440 K/uL  Basic metabolic panel     Status: Abnormal   Collection Time: 07/22/15  6:05 AM  Result Value Ref Range   Sodium 139 135 - 145 mmol/L   Potassium 4.4 3.5 - 5.1 mmol/L   Chloride 101 101 - 111 mmol/L   CO2 29 22 - 32 mmol/L   Glucose, Bld 172 (H) 65 - 99 mg/dL   BUN 23 (H) 6 - 20 mg/dL   Creatinine, Ser 0.94 0.44 - 1.00 mg/dL   Calcium 8.4 (L) 8.9 - 10.3 mg/dL   GFR calc non Af Amer >60 >60 mL/min   GFR calc Af Amer >60 >60 mL/min    Comment: (NOTE) The eGFR has been calculated using the CKD EPI equation. This calculation has not been validated in all clinical situations. eGFR's persistently <60 mL/min signify possible Chronic Kidney Disease.    Anion gap 9 5 - 15  Magnesium     Status: None   Collection Time: 07/22/15  6:05 AM  Result Value Ref Range   Magnesium 2.2 1.7 - 2.4 mg/dL  Phosphorus     Status: None   Collection Time: 07/22/15  6:05 AM  Result Value Ref Range   Phosphorus 4.5 2.5 - 4.6 mg/dL  Glucose, capillary     Status: Abnormal   Collection Time: 07/22/15  7:29 AM  Result Value Ref Range   Glucose-Capillary 176 (H) 65 - 99 mg/dL  Glucose, capillary     Status: Abnormal   Collection Time: 07/22/15 11:37 AM  Result Value Ref Range   Glucose-Capillary 278 (H) 65 - 99 mg/dL  Glucose, capillary     Status: Abnormal   Collection Time: 07/22/15  4:09 PM  Result Value Ref Range    Glucose-Capillary 181 (H) 65 - 99 mg/dL  Glucose, capillary     Status: Abnormal   Collection Time: 07/22/15  9:46 PM  Result Value Ref Range   Glucose-Capillary 189 (H) 65 - 99 mg/dL  Comment 1 Notify RN   Glucose, capillary     Status: Abnormal   Collection Time: 07/23/15  7:29 AM  Result Value Ref Range   Glucose-Capillary 192 (H) 65 - 99 mg/dL  Glucose, capillary     Status: Abnormal   Collection Time: 07/23/15 11:57 AM  Result Value Ref Range   Glucose-Capillary 261 (H) 65 - 99 mg/dL  Glucose, capillary     Status: Abnormal   Collection Time: 07/23/15  4:09 PM  Result Value Ref Range   Glucose-Capillary 175 (H) 65 - 99 mg/dL    Current Facility-Administered Medications  Medication Dose Route Frequency Provider Last Rate Last Dose  . acetaminophen (TYLENOL) tablet 650 mg  650 mg Oral Q6H PRN Wilhelmina Mcardle, MD   650 mg at 07/20/15 1840  . albuterol (PROVENTIL) (2.5 MG/3ML) 0.083% nebulizer solution 2.5 mg  2.5 mg Nebulization Q4H PRN Wilhelmina Mcardle, MD      . ALPRAZolam Duanne Moron) tablet 0.5 mg  0.5 mg Oral TID PRN Wilhelmina Mcardle, MD   0.5 mg at 07/23/15 1121  . amitriptyline (ELAVIL) tablet 50 mg  50 mg Oral QHS Wilhelmina Mcardle, MD   50 mg at 07/22/15 2121  . atenolol (TENORMIN) tablet 12.5 mg  12.5 mg Oral BID Wilhelmina Mcardle, MD   12.5 mg at 07/23/15 1204  . atorvastatin (LIPITOR) tablet 40 mg  40 mg Oral Daily Wilhelmina Mcardle, MD   40 mg at 07/23/15 1055  . enoxaparin (LOVENOX) injection 40 mg  40 mg Subcutaneous Q24H Gladstone Lighter, MD   40 mg at 07/22/15 2122  . fluticasone furoate-vilanterol (BREO ELLIPTA) 100-25 MCG/INH 1 puff  1 puff Inhalation Daily Wilhelmina Mcardle, MD   1 puff at 07/23/15 1055  . gabapentin (NEURONTIN) capsule 300 mg  300 mg Oral TID Wilhelmina Mcardle, MD   300 mg at 07/23/15 1600  . hydrALAZINE (APRESOLINE) injection 10-40 mg  10-40 mg Intravenous Q4H PRN Wilhelmina Mcardle, MD   20 mg at 07/21/15 1625  . hydrochlorothiazide (MICROZIDE) capsule 12.5 mg   12.5 mg Oral Daily Wilhelmina Mcardle, MD   12.5 mg at 07/23/15 1055  . insulin aspart (novoLOG) injection 0-15 Units  0-15 Units Subcutaneous TID WC Wilhelmina Mcardle, MD   3 Units at 07/23/15 1700  . insulin aspart (novoLOG) injection 0-5 Units  0-5 Units Subcutaneous QHS Wilhelmina Mcardle, MD   0 Units at 07/20/15 2200  . insulin aspart (novoLOG) injection 4 Units  4 Units Subcutaneous TID WC Wilhelmina Mcardle, MD   4 Units at 07/23/15 1740  . insulin glargine (LANTUS) injection 35 Units  35 Units Subcutaneous Daily Wilhelmina Mcardle, MD   35 Units at 07/23/15 1055  . lisinopril (PRINIVIL,ZESTRIL) tablet 5 mg  5 mg Oral Daily Wilhelmina Mcardle, MD   5 mg at 07/23/15 1055  . magnesium sulfate IVPB 4 g 100 mL  4 g Intravenous Once Colbert Coyer, MD      . metoprolol (LOPRESSOR) injection 2.5-5 mg  2.5-5 mg Intravenous Q3H PRN Wilhelmina Mcardle, MD   5 mg at 07/21/15 2841  . ondansetron (ZOFRAN) injection 4 mg  4 mg Intravenous Q6H PRN Gladstone Lighter, MD      . oxyCODONE-acetaminophen (PERCOCET) 7.5-325 MG per tablet 1 tablet  1 tablet Oral Q6H PRN Wilhelmina Mcardle, MD   1 tablet at 07/23/15 0451  . phenol (CHLORASEPTIC) mouth spray 1 spray  1 spray Mouth/Throat  PRN Chesley Mires, MD   1 spray at 07/20/15 2105  . polyethylene glycol (MIRALAX / GLYCOLAX) packet 17 g  17 g Per Tube Daily PRN Wilhelmina Mcardle, MD      . sodium chloride flush (NS) 0.9 % injection 3 mL  3 mL Intravenous Q12H Gladstone Lighter, MD   3 mL at 07/23/15 1115  . tiotropium (SPIRIVA) inhalation capsule 18 mcg  18 mcg Inhalation Daily Wilhelmina Mcardle, MD   18 mcg at 07/23/15 0758  . traZODone (DESYREL) tablet 200 mg  200 mg Oral QHS PRN Mikael Spray, NP   200 mg at 07/22/15 2121   Current Outpatient Prescriptions  Medication Sig Dispense Refill  . albuterol (PROVENTIL) (2.5 MG/3ML) 0.083% nebulizer solution Take 2.5 mg by nebulization every 6 (six) hours as needed for wheezing or shortness of breath.    Marland Kitchen albuterol-ipratropium  (COMBIVENT) 18-103 MCG/ACT inhaler Inhale 1 puff into the lungs every 6 (six) hours as needed for wheezing or shortness of breath.    . ALPRAZolam (XANAX) 1 MG tablet Take 1 mg by mouth 4 (four) times daily as needed for anxiety.     Marland Kitchen amitriptyline (ELAVIL) 25 MG tablet Take 75 mg by mouth at bedtime.    Marland Kitchen atorvastatin (LIPITOR) 40 MG tablet Take 40 mg by mouth at bedtime.    . fluticasone (FLONASE) 50 MCG/ACT nasal spray Place 2 sprays into both nostrils daily as needed for rhinitis.    . Fluticasone-Salmeterol (ADVAIR) 250-50 MCG/DOSE AEPB Inhale 1 puff into the lungs 2 (two) times daily.    Marland Kitchen gabapentin (NEURONTIN) 300 MG capsule Take 1,200 mg by mouth 3 (three) times daily.     . insulin aspart (NOVOLOG FLEXPEN) 100 UNIT/ML FlexPen Inject 10 Units into the skin 3 (three) times daily before meals.    . insulin glargine (LANTUS) 100 unit/mL SOPN Inject 72 Units into the skin at bedtime.    Marland Kitchen lisinopril (PRINIVIL,ZESTRIL) 2.5 MG tablet Take 2.5 mg by mouth daily.    . magnesium oxide (MAG-OX) 400 (241.3 Mg) MG tablet Take 400 mg by mouth at bedtime.    Marland Kitchen oxyCODONE-acetaminophen (PERCOCET) 7.5-325 MG tablet Take 1 tablet by mouth every 4 (four) hours as needed for severe pain.    Marland Kitchen tiotropium (SPIRIVA) 18 MCG inhalation capsule Place 18 mcg into inhaler and inhale daily.    . traZODone (DESYREL) 100 MG tablet Take 200 mg by mouth at bedtime.       Musculoskeletal: Strength & Muscle Tone: decreased Gait & Station: unsteady Patient leans: N/A  Psychiatric Specialty Exam: Physical Exam  Constitutional: She appears well-developed and well-nourished.  HENT:  Head: Normocephalic and atraumatic.  Eyes: Conjunctivae are normal. Pupils are equal, round, and reactive to light.  Neck: Normal range of motion.  Cardiovascular: Normal heart sounds.   Respiratory: Effort normal.  GI: Soft.  Musculoskeletal: Normal range of motion.  Neurological: She is alert.  Skin: Skin is warm and dry.   Psychiatric: Judgment normal. Her speech is delayed. She is slowed. Cognition and memory are normal. She exhibits a depressed mood. She expresses no suicidal ideation.    Review of Systems  Constitutional: Negative.   HENT: Negative.   Eyes: Negative.   Respiratory: Negative.   Cardiovascular: Negative.   Gastrointestinal: Negative.   Musculoskeletal: Negative.   Skin: Negative.   Neurological: Negative.   Psychiatric/Behavioral: Positive for depression. Negative for suicidal ideas, hallucinations, memory loss and substance abuse. The patient has insomnia. The patient is  not nervous/anxious.     Blood pressure 110/50, pulse 80, temperature 98.1 F (36.7 C), temperature source Oral, resp. rate 13, height _0  (1.575 m), weight 80.5 kg (177 lb 7.5 oz), SpO2 95 %.Body mass index is 32.45 kg/(m^2).  General Appearance: Casual  Eye Contact:  Fair  Speech:  Slow  Volume:  Decreased  Mood:  Dysphoric  Affect:  Tearful  Thought Process:  Goal Directed  Orientation:  Full (Time, Place, and Person)  Thought Content:  Logical  Suicidal Thoughts:  No  Homicidal Thoughts:  No  Memory:  Immediate;   Good Recent;   Fair Remote;   Fair  Judgement:  Fair  Insight:  Good  Psychomotor Activity:  Decreased  Concentration:  Concentration: Fair  Recall:  [employer] of Knowledge:  Fair  Language:  Fair  Akathisia:  No  Handed:  Right  AIMS (if indicated):     Assets:  Communication Skills Desire for Improvement Housing Resilience Social Support  ADL's:  Intact  Cognition:  WNL  Sleep:        Treatment Plan Summary: Plan 55 year old woman who is having an appropriate level of grief reaction to the recent death of her son. Patient completely denies suicidal ideation. She is able to articulate multiple positive things in her life to live for. She articulates positive plans for the future. Patient is understanding of the need for careful attention to her medical problems and agrees that  she will be paying closer attention. There is no indication to change her psychiatric medicine. Education completed about the risks of chronic benzodiazepine and narcotic use together especially in a person with fragile respiratory problems. I would not insist on changing her medicine but just remind her of the importance of being very careful with that. She is reminded that if symptoms get worse or if suicidal ideation were to arise she needs to speak to family or her healthcare provider immediately. She agrees to all of this.  Disposition: Patient does not meet criteria for psychiatric inpatient admission. Supportive therapy provided about ongoing stressors.  Alethia Berthold, MD 07/23/2015 8:18 PM

## 2015-08-06 ENCOUNTER — Encounter: Payer: Self-pay | Admitting: *Deleted

## 2015-08-06 ENCOUNTER — Inpatient Hospital Stay: Payer: Medicaid Other | Admitting: Pulmonary Disease

## 2015-08-30 ENCOUNTER — Emergency Department
Admission: EM | Admit: 2015-08-30 | Discharge: 2015-08-31 | Disposition: A | Discharge status: Home / Self Care | Payer: Medicaid Other | Attending: Emergency Medicine | Admitting: Emergency Medicine

## 2015-08-30 DIAGNOSIS — F17210 Nicotine dependence, cigarettes, uncomplicated: Secondary | ICD-10-CM | POA: Diagnosis not present

## 2015-08-30 DIAGNOSIS — F329 Major depressive disorder, single episode, unspecified: Secondary | ICD-10-CM | POA: Diagnosis not present

## 2015-08-30 DIAGNOSIS — F331 Major depressive disorder, recurrent, moderate: Secondary | ICD-10-CM | POA: Diagnosis not present

## 2015-08-30 DIAGNOSIS — Z634 Disappearance and death of family member: Secondary | ICD-10-CM

## 2015-08-30 DIAGNOSIS — J441 Chronic obstructive pulmonary disease with (acute) exacerbation: Secondary | ICD-10-CM | POA: Insufficient documentation

## 2015-08-30 DIAGNOSIS — E119 Type 2 diabetes mellitus without complications: Secondary | ICD-10-CM | POA: Diagnosis not present

## 2015-08-30 DIAGNOSIS — Z79899 Other long term (current) drug therapy: Secondary | ICD-10-CM | POA: Insufficient documentation

## 2015-08-30 DIAGNOSIS — Z794 Long term (current) use of insulin: Secondary | ICD-10-CM | POA: Insufficient documentation

## 2015-08-30 DIAGNOSIS — R258 Other abnormal involuntary movements: Secondary | ICD-10-CM | POA: Diagnosis not present

## 2015-08-30 DIAGNOSIS — T424X2A Poisoning by benzodiazepines, intentional self-harm, initial encounter: Secondary | ICD-10-CM | POA: Insufficient documentation

## 2015-08-30 DIAGNOSIS — I10 Essential (primary) hypertension: Secondary | ICD-10-CM | POA: Diagnosis not present

## 2015-08-30 DIAGNOSIS — Z7951 Long term (current) use of inhaled steroids: Secondary | ICD-10-CM | POA: Diagnosis not present

## 2015-08-30 DIAGNOSIS — R45851 Suicidal ideations: Secondary | ICD-10-CM | POA: Diagnosis present

## 2015-08-30 DIAGNOSIS — J45909 Unspecified asthma, uncomplicated: Secondary | ICD-10-CM | POA: Insufficient documentation

## 2015-08-30 DIAGNOSIS — F419 Anxiety disorder, unspecified: Secondary | ICD-10-CM

## 2015-08-30 DIAGNOSIS — F4321 Adjustment disorder with depressed mood: Secondary | ICD-10-CM | POA: Diagnosis present

## 2015-08-30 DIAGNOSIS — F32A Depression, unspecified: Secondary | ICD-10-CM

## 2015-08-30 LAB — CBC
HCT: 38.2 % (ref 35.0–47.0)
Hemoglobin: 12.7 g/dL (ref 12.0–16.0)
MCH: 29.5 pg (ref 26.0–34.0)
MCHC: 33.3 g/dL (ref 32.0–36.0)
MCV: 88.5 fL (ref 80.0–100.0)
PLATELETS: 282 10*3/uL (ref 150–440)
RBC: 4.32 MIL/uL (ref 3.80–5.20)
RDW: 14.8 % — AB (ref 11.5–14.5)
WBC: 10 10*3/uL (ref 3.6–11.0)

## 2015-08-30 LAB — COMPREHENSIVE METABOLIC PANEL
ALT: 8 U/L — AB (ref 14–54)
AST: 15 U/L (ref 15–41)
Albumin: 3.4 g/dL — ABNORMAL LOW (ref 3.5–5.0)
Alkaline Phosphatase: 92 U/L (ref 38–126)
Anion gap: 10 (ref 5–15)
BUN: 18 mg/dL (ref 6–20)
CALCIUM: 8.9 mg/dL (ref 8.9–10.3)
CHLORIDE: 104 mmol/L (ref 101–111)
CO2: 25 mmol/L (ref 22–32)
CREATININE: 0.9 mg/dL (ref 0.44–1.00)
Glucose, Bld: 258 mg/dL — ABNORMAL HIGH (ref 65–99)
Potassium: 4 mmol/L (ref 3.5–5.1)
Sodium: 139 mmol/L (ref 135–145)
Total Bilirubin: 0.4 mg/dL (ref 0.3–1.2)
Total Protein: 7.5 g/dL (ref 6.5–8.1)

## 2015-08-30 LAB — ACETAMINOPHEN LEVEL: Acetaminophen (Tylenol), Serum: <10 ug/mL — ABNORMAL LOW (ref 10–30)

## 2015-08-30 LAB — ETHANOL

## 2015-08-30 LAB — SALICYLATE LEVEL

## 2015-08-30 MED ORDER — TRAZODONE HCL 100 MG PO TABS
200.0000 mg | ORAL_TABLET | Freq: Every day | ORAL | Status: DC
  Administered 2015-08-30: 200 mg via ORAL
  Filled 2015-08-30: qty 2

## 2015-08-30 MED ORDER — ALBUTEROL SULFATE (2.5 MG/3ML) 0.083% IN NEBU: 2.5000 mg | INHALATION_SOLUTION | Freq: Four times a day (QID) | RESPIRATORY_TRACT | Status: DC | PRN

## 2015-08-30 MED ORDER — LISINOPRIL 5 MG PO TABS
2.5000 mg | ORAL_TABLET | Freq: Every day | ORAL | Status: DC
  Administered 2015-08-31: 2.5 mg via ORAL
  Filled 2015-08-30: qty 1

## 2015-08-30 MED ORDER — ALPRAZOLAM 0.5 MG PO TABS
1.0000 mg | ORAL_TABLET | Freq: Four times a day (QID) | ORAL | Status: DC | PRN
  Administered 2015-08-31: 1 mg via ORAL
  Filled 2015-08-30 (×2): qty 2

## 2015-08-30 MED ORDER — TIOTROPIUM BROMIDE MONOHYDRATE 18 MCG IN CAPS
18.0000 ug | ORAL_CAPSULE | Freq: Every day | RESPIRATORY_TRACT | Status: DC
  Filled 2015-08-30: qty 5

## 2015-08-30 MED ORDER — AMITRIPTYLINE HCL 25 MG PO TABS
75.0000 mg | ORAL_TABLET | Freq: Every day | ORAL | Status: DC
  Filled 2015-08-30 (×2): qty 3

## 2015-08-30 MED ORDER — GABAPENTIN 300 MG PO CAPS
1200.0000 mg | ORAL_CAPSULE | Freq: Three times a day (TID) | ORAL | Status: DC
  Administered 2015-08-30 – 2015-08-31 (×2): 1200 mg via ORAL
  Filled 2015-08-30 (×2): qty 4

## 2015-08-30 MED ORDER — MOMETASONE FURO-FORMOTEROL FUM 200-5 MCG/ACT IN AERO
2.0000 | INHALATION_SPRAY | Freq: Two times a day (BID) | RESPIRATORY_TRACT | Status: DC
  Filled 2015-08-30: qty 8.8

## 2015-08-30 MED ORDER — ATORVASTATIN CALCIUM 20 MG PO TABS
40.0000 mg | ORAL_TABLET | Freq: Every day | ORAL | Status: DC
  Administered 2015-08-30: 40 mg via ORAL
  Filled 2015-08-30: qty 2

## 2015-08-30 MED ORDER — MAGNESIUM OXIDE 400 (241.3 MG) MG PO TABS
400.0000 mg | ORAL_TABLET | Freq: Every day | ORAL | Status: DC
Start: 2015-08-30 — End: 2015-08-31
  Administered 2015-08-30: 400 mg via ORAL
  Filled 2015-08-30: qty 1

## 2015-08-30 NOTE — ED Provider Notes (Signed)
[facility] Emergency Department Provider Note  ____________________________________________   First MD Initiated Contact with Patient 08/30/15 2115     (approximate)  I have reviewed the triage vital signs and the nursing notes.   HISTORY  Chief Complaint Suicidal  Patient is somnolent likely secondary to intentional benzo overdose which may limit quality of history  HPI [name] is a 55 y.o. female with a history of depression and prior suicide attempts who was placed under involuntary commitment by a friend who called the police after the patient reportedly took an overdose of her Xanax and thencalled the petitioner and told her that she took an overdose and wanted to go to sleep and never wake up again.  She has a history of depression and reportedly has history of prior suicide attempts.  The patient is denying this and says that she had had a disagreement with her sister about her medications and that her sister called the police on her.  She states that she does not have any suicidal ideation or homicidal ideation.  She also denies any acute medical complaints.  She states that her depression has been gradual in onset and is chronic.  She does not admit to taking an overdose of her medications but she does appear quite somnolent even after hours in the emergency department.   Past Medical History:  Diagnosis Date  . Anxiety   . Asthma   . Coarse tremors   . COPD (chronic obstructive pulmonary disease) (HCC)    on 3L home o2  . 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 Hyperlipidemia   . Hypertension   . Insomnia     Patient Active Problem List   Diagnosis Date Noted  . Grief at loss of child 07/23/2015  . Acute respiratory failure (HCC)   . COPD exacerbation (HCC) 07/17/2015    Past Surgical History:  Procedure Laterality Date  . BACK SURGERY     x 2 lumbar spine  . BRONCHOSCOPY    . CERVICAL SPINE  SURGERY    . CHOLECYSTECTOMY    . FOOT SURGERY      Prior to Admission medications   Medication Sig Start Date End Date Taking? Authorizing Provider  albuterol (PROVENTIL) (2.5 MG/3ML) 0.083% nebulizer solution Take 2.5 mg by nebulization every 6 (six) hours as needed for wheezing or shortness of breath.   Yes Historical Provider, MD  albuterol-ipratropium (COMBIVENT) 18-103 MCG/ACT inhaler Inhale 1 puff into the lungs every 6 (six) hours as needed for wheezing or shortness of breath.   Yes Historical Provider, MD  ALPRAZolam Prudy Feeler) 1 MG tablet Take 1 mg by mouth 4 (four) times daily as needed for anxiety.    Yes Historical Provider, MD  amitriptyline (ELAVIL) 25 MG tablet Take 75 mg by mouth at bedtime.   Yes Historical Provider, MD  atorvastatin (LIPITOR) 40 MG tablet Take 40 mg by mouth at bedtime.   Yes Historical Provider, MD  fluticasone (FLONASE) 50 MCG/ACT nasal spray Place 2 sprays into both nostrils daily as needed for rhinitis.   Yes Historical Provider, MD  Fluticasone-Salmeterol (ADVAIR) 250-50 MCG/DOSE AEPB Inhale 1 puff into the lungs 2 (two) times daily.   Yes Historical Provider, MD  gabapentin (NEURONTIN) 300 MG capsule Take 1,200 mg by mouth 3 (three) times daily.    Yes Historical Provider, MD  insulin aspart (NOVOLOG FLEXPEN) 100 UNIT/ML FlexPen Inject 10 Units into the skin 3 (three) times daily before  meals.   Yes Historical Provider, MD  insulin glargine (LANTUS) 100 unit/mL SOPN Inject 72 Units into the skin at bedtime.   Yes Historical Provider, MD  lisinopril (PRINIVIL,ZESTRIL) 2.5 MG tablet Take 2.5 mg by mouth daily.   Yes Historical Provider, MD  magnesium oxide (MAG-OX) 400 (241.3 Mg) MG tablet Take 400 mg by mouth at bedtime.   Yes Historical Provider, MD  oxyCODONE-acetaminophen (PERCOCET) 7.5-325 MG tablet Take 1 tablet by mouth every 4 (four) hours as needed for severe pain.   Yes Historical Provider, MD  tiotropium (SPIRIVA) 18 MCG inhalation capsule Place 18  mcg into inhaler and inhale daily.   Yes Historical Provider, MD  traZODone (DESYREL) 100 MG tablet Take 200 mg by mouth at bedtime.    Yes Historical Provider, MD    Allergies Lyrica [pregabalin]; Prozac [fluoxetine hcl]; and Vicodin [hydrocodone-acetaminophen]  Family History  Problem Relation Age of Onset  . CAD Mother   . Hypertension Mother   . Throat cancer Father     Social History Social History  Substance Use Topics  . Smoking status: Current Every Day Smoker    Packs/day: 0.50    Types: Cigarettes  . Smokeless tobacco: Never Used  . Alcohol use No    Review of Systems Constitutional: No fever/chills Eyes: No visual changes. ENT: No sore throat. Cardiovascular: Denies chest pain. Respiratory: Denies shortness of breath. Gastrointestinal: No abdominal pain.  No nausea, no vomiting.  No diarrhea.  No constipation. Genitourinary: Negative for dysuria. Musculoskeletal: Negative for back pain. Skin: Negative for rash. Neurological: Negative for headaches, focal weakness or numbness. Psych:  Somnolent but denies SI/HI  10-point ROS otherwise negative.  ____________________________________________   PHYSICAL EXAM:  VITAL SIGNS: ED Triage Vitals  Enc Vitals Group     BP 08/30/15 1811 127/77     Pulse Rate 08/30/15 1811 85     Resp 08/30/15 1811 (!) 22     Temp 08/30/15 1811 97.7 F (36.5 C)     Temp Source 08/30/15 1811 Oral     SpO2 08/30/15 1811 94 %     Weight 08/30/15 1811 170 lb (77.1 kg)     Height 08/30/15 1811 5\' 3"  (1.6 m)     Head Circumference --      Peak Flow --      Pain Score 08/30/15 1824 8     Pain Loc --      Pain Edu? --      Excl. in GC? --     Constitutional: Alert and oriented. Well appearing and in no acute distress. Eyes: Conjunctivae are normal. PERRL. EOMI. Head: Atraumatic. Nose: No congestion/rhinnorhea. Mouth/Throat: Mucous membranes are moist.  Oropharynx non-erythematous. Neck: No stridor.  No meningeal signs.     Cardiovascular: Normal rate, regular rhythm. Good peripheral circulation. Grossly normal heart sounds.   Respiratory: Normal respiratory effort.  No retractions. Lungs CTAB. Gastrointestinal: Soft and nontender. No distention.  Musculoskeletal: No lower extremity tenderness nor edema. No gross deformities of extremities. Neurologic:  Normal speech and language. No gross focal neurologic deficits are appreciated.  Skin:  Skin is warm, dry and intact. No rash noted. Psychiatric: Mood and affect are somnolent but generally appropriate.  Denies SI/HI  ____________________________________________   LABS (all labs ordered are listed, but only abnormal results are displayed)  Labs Reviewed  COMPREHENSIVE METABOLIC PANEL - Abnormal; Notable for the following:       Result Value   Glucose, Bld 258 (*)    Albumin 3.4 (*)  ALT 8 (*)    All other components within normal limits  ACETAMINOPHEN LEVEL - Abnormal; Notable for the following:    Acetaminophen (Tylenol), Serum <10 (*)    All other components within normal limits  CBC - Abnormal; Notable for the following:    RDW 14.8 (*)    All other components within normal limits  ETHANOL  SALICYLATE LEVEL  URINE DRUG SCREEN, QUALITATIVE ([facility] ONLY)   ____________________________________________  EKG  None ____________________________________________  RADIOLOGY   No results found.  ____________________________________________   PROCEDURES  Procedure(s) performed:   Procedures   ____________________________________________   INITIAL IMPRESSION / ASSESSMENT AND PLAN / ED COURSE  Pertinent labs & imaging results that were available during my care of the patient were reviewed by me and considered in my medical decision making (see chart for details).  Probable intentional overdose.  No acute medical concerns at this time, protecting airway without any difficulty.  Upholding IVC, consulting TTS and psych.  Clinical Course     ____________________________________________  FINAL CLINICAL IMPRESSION(S) / ED DIAGNOSES  Final diagnoses:  Depression  Overdose of benzodiazepine, intentional self-harm, initial encounter (HCC)  Involuntary commitment     MEDICATIONS GIVEN DURING THIS VISIT:  Medications - No data to display   NEW OUTPATIENT MEDICATIONS STARTED DURING THIS VISIT:  New Prescriptions   No medications on file      Note:  This document was prepared using Dragon voice recognition software and may include unintentional dictation errors.    Loleta Rose, MD 08/30/15 2129

## 2015-08-30 NOTE — ED Triage Notes (Signed)
Pt comes into the ED via [place] [employer] under IVC , pt daughter/petitioner stated that pt took 16 xanax last night in attempt for self harm.. Pt denies this and states the daughter was upset because she would not give her any xanax.

## 2015-08-30 NOTE — ED Notes (Signed)
Daughter called Gweneth Dimitri) stated she wanted pt. To call home when she had a chance.

## 2015-08-30 NOTE — BH Assessment (Signed)
Assessment Note  [name] is an 55 y.o. female. [name] arrived to the ED by way of EMS.  She reports that her daughter wanted to take control of her medication and she would not let her.  She reports that her daughter complains that she is taking too much of her medications. She denied symptoms of depression or anxiety.  She denied having auditory or visual hallucinations.  She denied having suicidal or homicidal ideation or intent.  She reports her son dying January 5th, and that she has been having trouble with that.  She reports that her daughter tells her that she has 2 other kids and grand kids, causing them to argue. She states her daughter does not understand how she feels losing her only son.  She denied the use of alcohol or illegal drug usage.  Diagnosis: Depression, Grief  Past Medical History:  Past Medical History:  Diagnosis Date  . Anxiety   . Asthma   . Coarse tremors   . COPD (chronic obstructive pulmonary disease) (HCC)    on 3L home o2  . 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 Hyperlipidemia   . Hypertension   . Insomnia     Past Surgical History:  Procedure Laterality Date  . BACK SURGERY     x 2 lumbar spine  . BRONCHOSCOPY    . CERVICAL SPINE SURGERY    . CHOLECYSTECTOMY    . FOOT SURGERY      Family History:  Family History  Problem Relation Age of Onset  . CAD Mother   . Hypertension Mother   . Throat cancer Father     Social History:  reports that she has been smoking Cigarettes.  She has been smoking about 0.50 packs per day. She has never used smokeless tobacco. She reports that she does not drink alcohol or use drugs.  Additional Social History:  Alcohol / Drug Use History of alcohol / drug use?: No history of alcohol / drug abuse  CIWA: CIWA-Ar BP: (!) 155/81 Pulse Rate: 82 COWS:    Allergies:  Allergies  Allergen Reactions  . Lyrica [Pregabalin] Other (See Comments)    hallucination  . Prozac  [Fluoxetine Hcl] Other (See Comments)    hallucinations  . Vicodin [Hydrocodone-Acetaminophen] Rash    Home Medications:  (Not in a hospital admission)  OB/GYN Status:  No LMP recorded. Patient is postmenopausal.  General Assessment Data Location of Assessment: [facility] ED TTS Assessment: In system Is this a Tele or Face-to-Face Assessment?: Face-to-Face Is this an Initial Assessment or a Re-assessment for this encounter?: Initial Assessment Marital status: Single Maiden name: n/a Is patient pregnant?: No Pregnancy Status: No Living Arrangements: Children Can pt return to current living arrangement?: Yes Admission Status: Involuntary Is patient capable of signing voluntary admission?: Yes Referral Source: Self/Family/Friend Insurance type: Medicaid  Medical Screening Exam [facility] Walk-in ONLY) Medical Exam completed: Yes  Crisis Care Plan Living Arrangements: Children Legal Guardian:  (Self) Name of Psychiatrist: Dr. Providence Crosby Name of Therapist: Dr. Providence Crosby  Education Status Is patient currently in school?: No Current Grade: n/a Highest grade of school patient has completed: 9th Name of school: [employer] person: n/a  Risk to self with the past 6 months Suicidal Ideation: No Has patient been a risk to self within the past 6 months prior to admission? : No Suicidal Intent: No Has patient had any suicidal intent within the past 6 months prior to admission? :  No Is patient at risk for suicide?: No Suicidal Plan?: No Has patient had any suicidal plan within the past 6 months prior to admission? : No Access to Means: No What has been your use of drugs/alcohol within the last 12 months?: Denied use Previous Attempts/Gestures: No How many times?: 0 Other Self Harm Risks: denied Triggers for Past Attempts: None known Intentional Self Injurious Behavior: None Family Suicide History: No Recent stressful life event(s): Loss (Comment) (death of son) Persecutory  voices/beliefs?: No Depression: Yes (Patient denies) Depression Symptoms: Despondent, Loss of interest in usual pleasures Substance abuse history and/or treatment for substance abuse?: No Suicide prevention information given to non-admitted patients: Not applicable  Risk to Others within the past 6 months Homicidal Ideation: No Does patient have any lifetime risk of violence toward others beyond the six months prior to admission? : No Thoughts of Harm to Others: No Current Homicidal Intent: No Current Homicidal Plan: No Access to Homicidal Means: No Identified Victim: None identified History of harm to others?: No Assessment of Violence: None Noted Violent Behavior Description: denied Does patient have access to weapons?: No Criminal Charges Pending?: No Does patient have a court date: No Is patient on probation?: No  Psychosis Hallucinations: None noted Delusions: None noted  Mental Status Report Appearance/Hygiene: In scrubs, Unremarkable Eye Contact: Poor Motor Activity: Unremarkable Speech: Slow Level of Consciousness: Drowsy Mood: Depressed Affect: Flat Anxiety Level: None Thought Processes: Coherent Judgement: Unimpaired Orientation: Person, Place, Time, Situation Obsessive Compulsive Thoughts/Behaviors: None  Cognitive Functioning Concentration: Normal Memory: Recent Intact IQ: Average Insight: Fair Impulse Control: Fair Appetite: Fair Sleep: Increased Vegetative Symptoms: None  ADLScreening [facility] Assessment Services) Patient's cognitive ability adequate to safely complete daily activities?: Yes Patient able to express need for assistance with ADLs?: Yes Independently performs ADLs?: Yes (appropriate for developmental age)  Prior Inpatient Therapy Prior Inpatient Therapy: No Prior Therapy Dates: n/a Prior Therapy Facilty/Provider(s): n/a Reason for Treatment: n/a  Prior Outpatient Therapy Prior Outpatient Therapy: Yes Prior Therapy Dates:  Currently Prior Therapy Facilty/Provider(s): Dr. Providence Crosby Reason for Treatment: Depression, Grief Does patient have an ACCT team?: No Does patient have Intensive In-House Services?  : No Does patient have [facility] services? : No Does patient have P4CC services?: No  ADL Screening (condition at time of admission) Patient's cognitive ability adequate to safely complete daily activities?: Yes Patient able to express need for assistance with ADLs?: Yes Independently performs ADLs?: Yes (appropriate for developmental age)       Abuse/Neglect Assessment (Assessment to be complete while patient is alone) Physical Abuse: Denies Verbal Abuse: Denies Sexual Abuse: Denies Exploitation of patient/patient's resources: Denies Self-Neglect: Denies     [employer]chant [employer] officer (For Healthcare) Does patient have an advance directive?: No Would patient like information on creating an advanced directive?: No - patient declined information    Additional Information 1:1 In Past 12 Months?: No CIRT Risk: No Elopement Risk: No Does patient have medical clearance?: Yes     Disposition:  Disposition Initial Assessment Completed for this Encounter: Yes Disposition of Patient: Other dispositions  On Site Evaluation by:   Reviewed with Physician:    Justice Deeds 08/30/2015 10:32 PM

## 2015-08-30 NOTE — ED Notes (Signed)
Pt. States her son died early this year.  Pt. States she got into a verbal argument with younger daughter today.  Pt. States it was due to argument with her son that died this year.  Pt. States she did not take any more then she was prescribed in xanax.  Pt. States no SI or HI thoughts.

## 2015-08-31 DIAGNOSIS — F331 Major depressive disorder, recurrent, moderate: Secondary | ICD-10-CM | POA: Diagnosis not present

## 2015-08-31 DIAGNOSIS — F32A Depression, unspecified: Secondary | ICD-10-CM

## 2015-08-31 DIAGNOSIS — F419 Anxiety disorder, unspecified: Secondary | ICD-10-CM

## 2015-08-31 DIAGNOSIS — F329 Major depressive disorder, single episode, unspecified: Secondary | ICD-10-CM

## 2015-08-31 LAB — URINE DRUG SCREEN, QUALITATIVE (ARMC ONLY)
AMPHETAMINES, UR SCREEN: NOT DETECTED
BARBITURATES, UR SCREEN: NOT DETECTED
BENZODIAZEPINE, UR SCRN: POSITIVE — AB
Cannabinoid 50 Ng, Ur ~~LOC~~: NOT DETECTED
Cocaine Metabolite,Ur ~~LOC~~: NOT DETECTED
MDMA (Ecstasy)Ur Screen: NOT DETECTED
METHADONE SCREEN, URINE: NOT DETECTED
Opiate, Ur Screen: NOT DETECTED
Phencyclidine (PCP) Ur S: NOT DETECTED
TRICYCLIC, UR SCREEN: POSITIVE — AB

## 2015-08-31 MED ORDER — OXYCODONE-ACETAMINOPHEN 5-325 MG PO TABS
ORAL_TABLET | ORAL | Status: AC
  Administered 2015-08-31: 1 via ORAL
  Filled 2015-08-31: qty 1

## 2015-08-31 MED ORDER — OXYCODONE-ACETAMINOPHEN 5-325 MG PO TABS
1.0000 | ORAL_TABLET | Freq: Once | ORAL | Status: AC
  Administered 2015-08-31: 1 via ORAL

## 2015-08-31 NOTE — ED Provider Notes (Signed)
Patient is awake and alert.  Vitals:   08/31/15 0625 08/31/15 1530  BP: (!) 175/77 111/73  Pulse: 72 84  Resp: (!) 22 15  Temp: 98.6 F (37 C) 98.4 F (36.9 C)    Denies suicidal ideation or attempt at this time. Seen and cleared with involuntary commitment rescinded by Dr. Toni Amend.  Discharging patient, she has outpatient follow-up. Return precautions and treatment recommendations and follow-up discussed with the patient who is agreeable with the plan.    Sharyn Creamer, MD 08/31/15 [phone]

## 2015-08-31 NOTE — ED Notes (Addendum)
Pt c/o 10/10 left side pain "where I fell in bathroom". pt denies back pain or shoulder pain." I just have pain where I fell". Again, no redness, bruising, or obvious injury noted. Dr. Pershing Proud notified. Percocet ordered and given. Pts hands shaking and pt seems nervious. Xanax 1 mg given.

## 2015-08-31 NOTE — ED Notes (Signed)
Pt assisted to BR by tech complaining of chronic back pain and asking for percocet. Pt assisted back to room complaining of back pain by tech and asking for percocet. I went in to room to assess pts pain and pt said she fell against trash can in bathroom and has pain in left side. Pt showed me where pain was. Left side below ribs is where she indicated. Area was not red, no sign of any injury. Bathroom shows no sign that pt fell. Tech was outside of BR door and did not here pt fall or any unusual noise. Dr. Pershing Proud notified. MD declined to order any percocet. Pt given 1200 mg gabapentin dose early.

## 2015-08-31 NOTE — ED Notes (Signed)
Pt up walking in hall with walker requesting to go outside to smoke. Pt notified that there is no smoking on hospital property. Pt stated that she would walk off property. Pt asked to go back to room. Pt seems to be in less distress than earlier, not moaning or complaining about pain.

## 2015-08-31 NOTE — Discharge Instructions (Signed)

## 2015-08-31 NOTE — Consult Note (Signed)
[facility] Consult   Reason for Consult:  Consult for 55 year old woman with a history of depression brought in under involuntary commitment Referring Physician:  [place] Patient Identification: PEDRO OLDENBURG MRN:  [id number] Principal Diagnosis: Moderate recurrent major depression ([place]) Diagnosis:   Patient Active Problem List   Diagnosis Date Noted  . Anxiety [F41.9] 08/31/2015  . Depression [F32.9] 08/31/2015  . Moderate recurrent major depression ([place]) [F33.1] 08/31/2015  . Grief at loss of child [F43.21] 07/23/2015  . Acute respiratory failure ([place]) [J96.00]   . COPD exacerbation ([place]) [J44.1] 07/17/2015    Total Time spent with patient: 1 hour  Subjective:   ATLANTIS DELONG is a 55 y.o. female patient admitted with "I've just been really sad".  HPI:  Patient interviewed. Chart reviewed. Labs and vitals reviewed. 55 year old woman with a history of depression who was brought in under involuntary commitment. The commitment paperwork says that she had overdosed on her Xanax and had been talking about wishing she were dead. On interview today the patient admits that her mood stays sad and she is tearful much of the time. Her son died just about a month ago and she is still mourning and is not really getting back to any normal routine. Stays in bed a lot of the time. Feels negative a lot of the time. She denies however that she has any wish to die and denies that she overdosed on her medicine. Patient denies having any hallucinations or psychotic symptoms. She says she is compliant with her current outpatient psychiatric treatment. She has not reported any suicidal ideation or behaved in a dangerous manner since coming to the emergency room. She denies that she is abusing alcohol or drugs.  Medical history: Patient has COPD and is oxygen dependent. She was just in the hospital last month and was sent home on 3 L of oxygen. She has chronic back pain and possible  fibromyalgia as well as high blood pressure and COPD.  Social history: She lives with her daughter. She had a son who really did die in her house just about a month ago of an overdose. Patient's activity is currently pretty limited.  Substance abuse history: Patient denies any history of alcohol or drug abuse. Question has arisen about whether she could be overusing her Xanax but she does see a psychiatrist regularly who is familiar with her who has been prescribing her medicine.  Past Psychiatric History: Long-standing history of depression and anxiety. Distant history of hospital treatment. Denies any history of suicide attempts. She sees Dr. Kasandra Knudsen in [place] regularly. She is on Xanax chronically. She says she is taken other antidepressants before but they all made her hallucinate or made her feel worse.  Risk to Self: Suicidal Ideation: No Suicidal Intent: No Is patient at risk for suicide?: No Suicidal Plan?: No Access to Means: No What has been your use of drugs/alcohol within the last 12 months?: Denied use How many times?: 0 Other Self Harm Risks: denied Triggers for Past Attempts: None known Intentional Self Injurious Behavior: None Risk to Others: Homicidal Ideation: No Thoughts of Harm to Others: No Current Homicidal Intent: No Current Homicidal Plan: No Access to Homicidal Means: No Identified Victim: None identified History of harm to others?: No Assessment of Violence: None Noted Violent Behavior Description: denied Does patient have access to weapons?: No Criminal Charges Pending?: No Does patient have a court date: No Prior Inpatient Therapy: Prior Inpatient Therapy: No Prior Therapy Dates: n/a Prior Therapy Facilty/Provider(s):  n/a Reason for Treatment: n/a Prior Outpatient Therapy: Prior Outpatient Therapy: Yes Prior Therapy Dates: Currently Prior Therapy Facilty/Provider(s): Dr. Etheleen Sia Reason for Treatment: Depression, Grief Does patient have an ACCT team?:  No Does patient have Intensive In-House Services?  : No Does patient have [facility] services? : No Does patient have P4CC services?: No  Past Medical History:  Past Medical History:  Diagnosis Date  . Anxiety   . Asthma   . Coarse tremors   . COPD (chronic obstructive pulmonary disease) ([place])    on 3L home o2  . 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 Hyperlipidemia   . Hypertension   . Insomnia     Past Surgical History:  Procedure Laterality Date  . BACK SURGERY     x 2 lumbar spine  . BRONCHOSCOPY    . CERVICAL SPINE SURGERY    . CHOLECYSTECTOMY    . FOOT SURGERY     Family History:  Family History  Problem Relation Age of Onset  . CAD Mother   . Hypertension Mother   . Throat cancer Father    Family Psychiatric  History: Son had a substance abuse problem. Otherwise she is denying being aware of any family history Social History:  History  Alcohol Use No     History  Drug Use No    Social History   Social History  . Marital status: Single    Spouse name: N/A  . Number of children: N/A  . Years of education: N/A   Social History Main Topics  . Smoking status: Current Every Day Smoker    Packs/day: 0.50    Types: Cigarettes  . Smokeless tobacco: Never Used  . Alcohol use No  . Drug use: No  . Sexual activity: Not Asked   Other Topics Concern  . None   Social History Narrative   Lives with daughter, has a walker.   Additional Social History:    Allergies:   Allergies  Allergen Reactions  . Lyrica [Pregabalin] Other (See Comments)    hallucination  . Prozac [Fluoxetine Hcl] Other (See Comments)    hallucinations  . Vicodin [Hydrocodone-Acetaminophen] Rash    Labs:  Results for orders placed or performed during the hospital encounter of 08/30/15 (from the past 48 hour(s))  Comprehensive metabolic panel     Status: Abnormal   Collection Time: 08/30/15  6:27 PM  Result Value Ref Range   Sodium 139 135 - 145  mmol/L   Potassium 4.0 3.5 - 5.1 mmol/L   Chloride 104 101 - 111 mmol/L   CO2 25 22 - 32 mmol/L   Glucose, Bld 258 (H) 65 - 99 mg/dL   BUN 18 6 - 20 mg/dL   Creatinine, Ser 0.90 0.44 - 1.00 mg/dL   Calcium 8.9 8.9 - 10.3 mg/dL   Total Protein 7.5 6.5 - 8.1 g/dL   Albumin 3.4 (L) 3.5 - 5.0 g/dL   AST 15 15 - 41 U/L   ALT 8 (L) 14 - 54 U/L   Alkaline Phosphatase 92 38 - 126 U/L   Total Bilirubin 0.4 0.3 - 1.2 mg/dL   GFR calc non Af Amer >60 >60 mL/min   GFR calc Af Amer >60 >60 mL/min    Comment: (NOTE) The eGFR has been calculated using the CKD EPI equation. This calculation has not been validated in all clinical situations. eGFR's persistently <60 mL/min signify possible Chronic Kidney Disease.    Anion gap 10 5 -  15  Ethanol     Status: None   Collection Time: 08/30/15  6:27 PM  Result Value Ref Range   Alcohol, Ethyl (B) <5 <5 mg/dL    Comment:        LOWEST DETECTABLE LIMIT FOR SERUM ALCOHOL IS 5 mg/dL FOR MEDICAL PURPOSES ONLY   Salicylate level     Status: None   Collection Time: 08/30/15  6:27 PM  Result Value Ref Range   Salicylate Lvl <9.9 2.8 - 30.0 mg/dL  Acetaminophen level     Status: Abnormal   Collection Time: 08/30/15  6:27 PM  Result Value Ref Range   Acetaminophen (Tylenol), Serum <10 (L) 10 - 30 ug/mL    Comment:        THERAPEUTIC CONCENTRATIONS VARY SIGNIFICANTLY. A RANGE OF 10-30 ug/mL MAY BE AN EFFECTIVE CONCENTRATION FOR MANY PATIENTS. HOWEVER, SOME ARE BEST TREATED AT CONCENTRATIONS OUTSIDE THIS RANGE. ACETAMINOPHEN CONCENTRATIONS >150 ug/mL AT 4 HOURS AFTER INGESTION AND >50 ug/mL AT 12 HOURS AFTER INGESTION ARE OFTEN ASSOCIATED WITH TOXIC REACTIONS.   cbc     Status: Abnormal   Collection Time: 08/30/15  6:27 PM  Result Value Ref Range   WBC 10.0 3.6 - 11.0 K/uL   RBC 4.32 3.80 - 5.20 MIL/uL   Hemoglobin 12.7 12.0 - 16.0 g/dL   HCT 38.2 35.0 - 47.0 %   MCV 88.5 80.0 - 100.0 fL   MCH 29.5 26.0 - 34.0 pg   MCHC 33.3 32.0 - 36.0  g/dL   RDW 14.8 (H) 11.5 - 14.5 %   Platelets 282 150 - 440 K/uL  Urine Drug Screen, Qualitative     Status: Abnormal   Collection Time: 08/31/15 10:38 AM  Result Value Ref Range   Tricyclic, Ur Screen POSITIVE (A) NONE DETECTED   Amphetamines, Ur Screen NONE DETECTED NONE DETECTED   MDMA (Ecstasy)Ur Screen NONE DETECTED NONE DETECTED   Cocaine Metabolite,Ur [place] NONE DETECTED NONE DETECTED   Opiate, Ur Screen NONE DETECTED NONE DETECTED   Phencyclidine (PCP) Ur S NONE DETECTED NONE DETECTED   Cannabinoid 50 Ng, Ur [place] NONE DETECTED NONE DETECTED   Barbiturates, Ur Screen NONE DETECTED NONE DETECTED   Benzodiazepine, Ur Scrn POSITIVE (A) NONE DETECTED   Methadone Scn, Ur NONE DETECTED NONE DETECTED    Comment: (NOTE) 833  Tricyclics, urine               Cutoff 1000 ng/mL 200  Amphetamines, urine             Cutoff 1000 ng/mL 300  MDMA (Ecstasy), urine           Cutoff 500 ng/mL 400  Cocaine Metabolite, urine       Cutoff 300 ng/mL 500  Opiate, urine                   Cutoff 300 ng/mL 600  Phencyclidine (PCP), urine      Cutoff 25 ng/mL 700  Cannabinoid, urine              Cutoff 50 ng/mL 800  Barbiturates, urine             Cutoff 200 ng/mL 900  Benzodiazepine, urine           Cutoff 200 ng/mL 1000 Methadone, urine                Cutoff 300 ng/mL 1100 1200 The urine drug screen provides only a preliminary, unconfirmed 1300 analytical test result and  should not be used for non-medical 1400 purposes. Clinical consideration and professional judgment should 1500 be applied to any positive drug screen result due to possible 1600 interfering substances. A more specific alternate chemical method 1700 must be used in order to obtain a confirmed analytical result.  1800 Gas chromato graphy / mass spectrometry (GC/MS) is the preferred 1900 confirmatory method.     Current Facility-Administered Medications  Medication Dose Route Frequency Provider Last Rate Last Dose  . albuterol  (PROVENTIL) (2.5 MG/3ML) 0.083% nebulizer solution 2.5 mg  2.5 mg Nebulization Q6H PRN Hinda Kehr, MD      . ALPRAZolam Duanne Moron) tablet 1 mg  1 mg Oral QID PRN Hinda Kehr, MD   1 mg at 08/31/15 1311  . amitriptyline (ELAVIL) tablet 75 mg  75 mg Oral QHS Hinda Kehr, MD   Stopped at 08/31/15 [phone]  . atorvastatin (LIPITOR) tablet 40 mg  40 mg Oral QHS Hinda Kehr, MD   40 mg at 08/30/15 2344  . gabapentin (NEURONTIN) capsule 1,200 mg  1,200 mg Oral TID Hinda Kehr, MD   1,200 mg at 08/31/15 0853  . lisinopril (PRINIVIL,ZESTRIL) tablet 2.5 mg  2.5 mg Oral Daily Hinda Kehr, MD   2.5 mg at 08/31/15 0853  . magnesium oxide (MAG-OX) tablet 400 mg  400 mg Oral QHS Hinda Kehr, MD   400 mg at 08/30/15 2348  . mometasone-formoterol (DULERA) 200-5 MCG/ACT inhaler 2 puff  2 puff Inhalation BID Hinda Kehr, MD      . tiotropium [facility]) inhalation capsule 18 mcg  18 mcg Inhalation Daily Hinda Kehr, MD      . traZODone (DESYREL) tablet 200 mg  200 mg Oral QHS Hinda Kehr, MD   200 mg at 08/30/15 2343   Current Outpatient Prescriptions  Medication Sig Dispense Refill  . albuterol (PROVENTIL) (2.5 MG/3ML) 0.083% nebulizer solution Take 2.5 mg by nebulization every 6 (six) hours as needed for wheezing or shortness of breath.    Marland Kitchen albuterol-ipratropium (COMBIVENT) 18-103 MCG/ACT inhaler Inhale 1 puff into the lungs every 6 (six) hours as needed for wheezing or shortness of breath.    . ALPRAZolam (XANAX) 1 MG tablet Take 1 mg by mouth 4 (four) times daily as needed for anxiety.     Marland Kitchen amitriptyline (ELAVIL) 25 MG tablet Take 75 mg by mouth at bedtime.    Marland Kitchen atorvastatin (LIPITOR) 40 MG tablet Take 40 mg by mouth at bedtime.    . fluticasone (FLONASE) 50 MCG/ACT nasal spray Place 2 sprays into both nostrils daily as needed for rhinitis.    . Fluticasone-Salmeterol (ADVAIR) 250-50 MCG/DOSE AEPB Inhale 1 puff into the lungs 2 (two) times daily.    Marland Kitchen gabapentin (NEURONTIN) 300 MG capsule Take 1,200 mg by  mouth 3 (three) times daily.     . insulin aspart (NOVOLOG FLEXPEN) 100 UNIT/ML FlexPen Inject 10 Units into the skin 3 (three) times daily before meals.    . insulin glargine (LANTUS) 100 unit/mL SOPN Inject 72 Units into the skin at bedtime.    Marland Kitchen lisinopril (PRINIVIL,ZESTRIL) 2.5 MG tablet Take 2.5 mg by mouth daily.    . magnesium oxide (MAG-OX) 400 (241.3 Mg) MG tablet Take 400 mg by mouth at bedtime.    Marland Kitchen oxyCODONE-acetaminophen (PERCOCET) 7.5-325 MG tablet Take 1 tablet by mouth every 4 (four) hours as needed for severe pain. Every 4-6 hours    . tiotropium (SPIRIVA) 18 MCG inhalation capsule Place 18 mcg into inhaler and inhale daily.    Marland Kitchen  traZODone (DESYREL) 100 MG tablet Take 200 mg by mouth at bedtime.       Musculoskeletal: Strength & Muscle Tone: decreased Gait & Station: unsteady Patient leans: N/A  Psychiatric Specialty Exam: Physical Exam  Nursing note and vitals reviewed. Constitutional: She appears well-developed and well-nourished.  HENT:  Head: Normocephalic and atraumatic.  Eyes: Conjunctivae are normal. Pupils are equal, round, and reactive to light.  Neck: Normal range of motion.  Cardiovascular: Normal heart sounds.   Respiratory: Effort normal. No respiratory distress.  GI: Soft.  Musculoskeletal:       Legs: Neurological: She is alert. Coordination abnormal.  Skin: Skin is warm and dry.  Psychiatric: Her speech is normal. Her mood appears anxious. She is slowed. Thought content is not paranoid. She expresses impulsivity. She exhibits a depressed mood. She expresses no suicidal ideation. She exhibits abnormal recent memory.    Review of Systems  Constitutional: Positive for weight loss.  HENT: Negative.   Eyes: Negative.   Respiratory: Positive for shortness of breath.   Cardiovascular: Negative.   Gastrointestinal: Negative.   Musculoskeletal: Positive for myalgias.  Skin: Negative.   Neurological: Negative.   Psychiatric/Behavioral: Positive for  depression and memory loss. Negative for hallucinations, substance abuse and suicidal ideas. The patient is nervous/anxious and has insomnia.     Blood pressure (!) 175/77, pulse 72, temperature 98.6 F (37 C), temperature source Oral, resp. rate (!) 22, height 5' 3"  (1.6 m), weight 77.1 kg (170 lb), SpO2 93 %.Body mass index is 30.11 kg/m.  General Appearance: Disheveled  Eye Contact:  Fair  Speech:  Clear and Coherent  Volume:  Decreased  Mood:  Dysphoric  Affect:  Tearful  Thought Process:  Coherent  Orientation:  Full (Time, Place, and Person)  Thought Content:  Logical  Suicidal Thoughts:  No  Homicidal Thoughts:  No  Memory:  Immediate;   Good Recent;   Fair Remote;   Fair  Judgement:  Fair  Insight:  Fair  Psychomotor Activity:  Decreased  Concentration:  Concentration: Fair  Recall:  [employer] of Knowledge:  Fair  Language:  Fair  Akathisia:  No  Handed:  Right  AIMS (if indicated):     Assets:  Communication Skills Desire for Improvement Housing Resilience  ADL's:  Impaired  Cognition:  WNL  Sleep:        Treatment Plan Summary: Plan 55 year old woman who clearly has depression but is consistently denying any suicidal ideation. She is lucid and has been cooperative with treatment. She has appropriate outpatient treatment in place. At this point I don't think she meets commitment criteria. She is strongly requesting to be discharged home. I spent time with her reviewing the risks of her multiple sedating medicines and explained how they can cause memory impairment. Strongly encouraged her to talk with her doctors about whether she needs to cut down on some of them. Did not start any new medicine. She intends to follow up with her regular psychiatrist in [place]. IVC discontinued.  Disposition: No evidence of imminent risk to self or others at present.   Supportive therapy provided about ongoing stressors.  Alethia Berthold, MD 08/31/2015 3:11 PM

## 2015-08-31 NOTE — ED Notes (Signed)
Pt stated she needs ginger ale, to use the phone and some dilaudid. Tech gave pt phone and ginger ale and notified RN of dilaudid.

## 2015-08-31 NOTE — ED Notes (Signed)
Pt ambulated to bathroom complaining of back pain. Tech assisted pt back to bed and informed RN of back Pain. Pt unable to get urine for tech.

## 2015-08-31 NOTE — ED Notes (Signed)
Pt c/o 10/10 left side pain. Pt sitting on bed, no s/s of distress. States that percocet did not work and she always gets dilaudid here for her pain and she wants dilaudid, ginger ale, and the telephone. Pt given ginger ale and telephone. MD notified.

## 2015-12-19 ENCOUNTER — Encounter: Payer: Self-pay | Admitting: Emergency Medicine

## 2015-12-19 ENCOUNTER — Inpatient Hospital Stay
Admission: EM | Admit: 2015-12-19 | Discharge: 2015-12-21 | DRG: 065 | Disposition: A | Discharge status: Home / Self Care | Payer: Medicaid Other | Attending: Internal Medicine | Admitting: Internal Medicine

## 2015-12-19 ENCOUNTER — Emergency Department: Payer: Medicaid Other

## 2015-12-19 DIAGNOSIS — E785 Hyperlipidemia, unspecified: Secondary | ICD-10-CM | POA: Diagnosis present

## 2015-12-19 DIAGNOSIS — Z79891 Long term (current) use of opiate analgesic: Secondary | ICD-10-CM

## 2015-12-19 DIAGNOSIS — I639 Cerebral infarction, unspecified: Secondary | ICD-10-CM | POA: Diagnosis not present

## 2015-12-19 DIAGNOSIS — Z9119 Patient's noncompliance with other medical treatment and regimen: Secondary | ICD-10-CM

## 2015-12-19 DIAGNOSIS — E1165 Type 2 diabetes mellitus with hyperglycemia: Secondary | ICD-10-CM | POA: Diagnosis present

## 2015-12-19 DIAGNOSIS — E1151 Type 2 diabetes mellitus with diabetic peripheral angiopathy without gangrene: Secondary | ICD-10-CM | POA: Diagnosis present

## 2015-12-19 DIAGNOSIS — R262 Difficulty in walking, not elsewhere classified: Secondary | ICD-10-CM

## 2015-12-19 DIAGNOSIS — F419 Anxiety disorder, unspecified: Secondary | ICD-10-CM | POA: Diagnosis present

## 2015-12-19 DIAGNOSIS — Z885 Allergy status to narcotic agent status: Secondary | ICD-10-CM

## 2015-12-19 DIAGNOSIS — Z888 Allergy status to other drugs, medicaments and biological substances status: Secondary | ICD-10-CM | POA: Diagnosis not present

## 2015-12-19 DIAGNOSIS — I10 Essential (primary) hypertension: Secondary | ICD-10-CM | POA: Diagnosis present

## 2015-12-19 DIAGNOSIS — Z794 Long term (current) use of insulin: Secondary | ICD-10-CM | POA: Diagnosis not present

## 2015-12-19 DIAGNOSIS — Z8249 Family history of ischemic heart disease and other diseases of the circulatory system: Secondary | ICD-10-CM

## 2015-12-19 DIAGNOSIS — Z9181 History of falling: Secondary | ICD-10-CM | POA: Diagnosis not present

## 2015-12-19 DIAGNOSIS — Z9981 Dependence on supplemental oxygen: Secondary | ICD-10-CM

## 2015-12-19 DIAGNOSIS — F331 Major depressive disorder, recurrent, moderate: Secondary | ICD-10-CM | POA: Diagnosis present

## 2015-12-19 DIAGNOSIS — G4700 Insomnia, unspecified: Secondary | ICD-10-CM | POA: Diagnosis present

## 2015-12-19 DIAGNOSIS — E119 Type 2 diabetes mellitus without complications: Secondary | ICD-10-CM

## 2015-12-19 DIAGNOSIS — J449 Chronic obstructive pulmonary disease, unspecified: Secondary | ICD-10-CM | POA: Diagnosis present

## 2015-12-19 DIAGNOSIS — M549 Dorsalgia, unspecified: Secondary | ICD-10-CM | POA: Diagnosis present

## 2015-12-19 DIAGNOSIS — Z79899 Other long term (current) drug therapy: Secondary | ICD-10-CM

## 2015-12-19 DIAGNOSIS — R296 Repeated falls: Secondary | ICD-10-CM | POA: Diagnosis present

## 2015-12-19 DIAGNOSIS — R270 Ataxia, unspecified: Secondary | ICD-10-CM | POA: Diagnosis present

## 2015-12-19 DIAGNOSIS — M6281 Muscle weakness (generalized): Secondary | ICD-10-CM

## 2015-12-19 DIAGNOSIS — F17210 Nicotine dependence, cigarettes, uncomplicated: Secondary | ICD-10-CM | POA: Diagnosis present

## 2015-12-19 LAB — URINE DRUG SCREEN, QUALITATIVE (ARMC ONLY)
Amphetamines, Ur Screen: NOT DETECTED
BARBITURATES, UR SCREEN: NOT DETECTED
BENZODIAZEPINE, UR SCRN: POSITIVE — AB
CANNABINOID 50 NG, UR ~~LOC~~: NOT DETECTED
Cocaine Metabolite,Ur ~~LOC~~: NOT DETECTED
MDMA (Ecstasy)Ur Screen: NOT DETECTED
METHADONE SCREEN, URINE: NOT DETECTED
OPIATE, UR SCREEN: POSITIVE — AB
PHENCYCLIDINE (PCP) UR S: NOT DETECTED
Tricyclic, Ur Screen: POSITIVE — AB

## 2015-12-19 LAB — URINALYSIS COMPLETE WITH MICROSCOPIC (ARMC ONLY)
BILIRUBIN URINE: NEGATIVE
Glucose, UA: >500 mg/dL — AB
Ketones, ur: NEGATIVE mg/dL
LEUKOCYTES UA: NEGATIVE
Nitrite: NEGATIVE
PH: 5 (ref 5.0–8.0)
Protein, ur: >500 mg/dL — AB
SPECIFIC GRAVITY, URINE: 1.015 (ref 1.005–1.030)

## 2015-12-19 LAB — CBC WITH DIFFERENTIAL/PLATELET
BASOS ABS: 0 10*3/uL (ref 0–0.1)
BASOS PCT: 1 %
EOS ABS: 0.3 10*3/uL (ref 0–0.7)
EOS PCT: 5 %
HCT: 37.7 % (ref 35.0–47.0)
Hemoglobin: 12.3 g/dL (ref 12.0–16.0)
Lymphocytes Relative: 20 %
Lymphs Abs: 1.4 10*3/uL (ref 1.0–3.6)
MCH: 28.8 pg (ref 26.0–34.0)
MCHC: 32.7 g/dL (ref 32.0–36.0)
MCV: 88.1 fL (ref 80.0–100.0)
Monocytes Absolute: 0.3 10*3/uL (ref 0.2–0.9)
Monocytes Relative: 4 %
Neutro Abs: 5.1 10*3/uL (ref 1.4–6.5)
Neutrophils Relative %: 70 %
PLATELETS: 189 10*3/uL (ref 150–440)
RBC: 4.28 MIL/uL (ref 3.80–5.20)
RDW: 15.1 % — ABNORMAL HIGH (ref 11.5–14.5)
WBC: 7.2 10*3/uL (ref 3.6–11.0)

## 2015-12-19 LAB — COMPREHENSIVE METABOLIC PANEL
ALT: 13 U/L — AB (ref 14–54)
AST: 24 U/L (ref 15–41)
Albumin: 3.3 g/dL — ABNORMAL LOW (ref 3.5–5.0)
Alkaline Phosphatase: 121 U/L (ref 38–126)
Anion gap: 8 (ref 5–15)
BUN: 27 mg/dL — AB (ref 6–20)
CHLORIDE: 101 mmol/L (ref 101–111)
CO2: 30 mmol/L (ref 22–32)
CREATININE: 1.12 mg/dL — AB (ref 0.44–1.00)
Calcium: 8.7 mg/dL — ABNORMAL LOW (ref 8.9–10.3)
GFR calc Af Amer: >60 mL/min (ref >60)
GFR calc non Af Amer: 54 mL/min — ABNORMAL LOW (ref >60)
Glucose, Bld: 412 mg/dL — ABNORMAL HIGH (ref 65–99)
Potassium: 4.5 mmol/L (ref 3.5–5.1)
SODIUM: 139 mmol/L (ref 135–145)
Total Bilirubin: 0.4 mg/dL (ref 0.3–1.2)
Total Protein: 7.4 g/dL (ref 6.5–8.1)

## 2015-12-19 LAB — TROPONIN I: Troponin I: <0.03 ng/mL (ref <0.03)

## 2015-12-19 LAB — GLUCOSE, CAPILLARY
GLUCOSE-CAPILLARY: 416 mg/dL — AB (ref 65–99)
GLUCOSE-CAPILLARY: 329 mg/dL — AB (ref 65–99)
GLUCOSE-CAPILLARY: 363 mg/dL — AB (ref 65–99)
Glucose-Capillary: 198 mg/dL — ABNORMAL HIGH (ref 65–99)

## 2015-12-19 LAB — TSH: TSH: 0.544 u[IU]/mL (ref 0.350–4.500)

## 2015-12-19 LAB — ETHANOL: Alcohol, Ethyl (B): <5 mg/dL (ref <5)

## 2015-12-19 MED ORDER — OXYCODONE-ACETAMINOPHEN 7.5-325 MG PO TABS
1.0000 | ORAL_TABLET | Freq: Four times a day (QID) | ORAL | Status: DC | PRN
  Administered 2015-12-20 – 2015-12-21 (×5): 1 via ORAL
  Filled 2015-12-19 (×5): qty 1

## 2015-12-19 MED ORDER — SODIUM CHLORIDE 0.9 % IV BOLUS (SEPSIS)
500.0000 mL | Freq: Once | INTRAVENOUS | Status: AC
  Administered 2015-12-19: 500 mL via INTRAVENOUS

## 2015-12-19 MED ORDER — AMITRIPTYLINE HCL 25 MG PO TABS
75.0000 mg | ORAL_TABLET | Freq: Every day | ORAL | Status: DC
  Administered 2015-12-20: 75 mg via ORAL
  Filled 2015-12-19: qty 3

## 2015-12-19 MED ORDER — IPRATROPIUM-ALBUTEROL 18-103 MCG/ACT IN AERO: 1.0000 | INHALATION_SPRAY | Freq: Four times a day (QID) | RESPIRATORY_TRACT | Status: DC | PRN

## 2015-12-19 MED ORDER — INSULIN ASPART 100 UNIT/ML ~~LOC~~ SOLN
6.0000 [IU] | Freq: Once | SUBCUTANEOUS | Status: AC
  Administered 2015-12-19: 6 [IU] via INTRAVENOUS
  Filled 2015-12-19: qty 6

## 2015-12-19 MED ORDER — ATORVASTATIN CALCIUM 20 MG PO TABS
40.0000 mg | ORAL_TABLET | Freq: Every day | ORAL | Status: DC
  Administered 2015-12-20 (×2): 40 mg via ORAL
  Filled 2015-12-19 (×2): qty 2

## 2015-12-19 MED ORDER — LISINOPRIL 5 MG PO TABS
2.5000 mg | ORAL_TABLET | Freq: Every day | ORAL | Status: DC
  Administered 2015-12-20 – 2015-12-21 (×2): 2.5 mg via ORAL
  Filled 2015-12-19 (×2): qty 1

## 2015-12-19 MED ORDER — LIDOCAINE 5 % EX PTCH
MEDICATED_PATCH | CUTANEOUS | Status: AC
  Filled 2015-12-19: qty 1

## 2015-12-19 MED ORDER — MOMETASONE FURO-FORMOTEROL FUM 200-5 MCG/ACT IN AERO
2.0000 | INHALATION_SPRAY | Freq: Two times a day (BID) | RESPIRATORY_TRACT | Status: DC
  Administered 2015-12-20 – 2015-12-21 (×3): 2 via RESPIRATORY_TRACT
  Filled 2015-12-19: qty 8.8

## 2015-12-19 MED ORDER — ENOXAPARIN SODIUM 40 MG/0.4ML ~~LOC~~ SOLN
40.0000 mg | SUBCUTANEOUS | Status: DC
  Administered 2015-12-20 (×2): 40 mg via SUBCUTANEOUS
  Filled 2015-12-19 (×2): qty 0.4

## 2015-12-19 MED ORDER — INSULIN ASPART 100 UNIT/ML ~~LOC~~ SOLN
0.0000 [IU] | Freq: Three times a day (TID) | SUBCUTANEOUS | Status: DC
  Administered 2015-12-20: 18:00:00 7 [IU] via SUBCUTANEOUS
  Administered 2015-12-20 (×2): 2 [IU] via SUBCUTANEOUS
  Administered 2015-12-21: 3 [IU] via SUBCUTANEOUS
  Administered 2015-12-21: 9 [IU] via SUBCUTANEOUS
  Filled 2015-12-19: qty 9
  Filled 2015-12-19: qty 3
  Filled 2015-12-19: qty 7
  Filled 2015-12-19 (×2): qty 2

## 2015-12-19 MED ORDER — ALPRAZOLAM 0.5 MG PO TABS
0.5000 mg | ORAL_TABLET | Freq: Three times a day (TID) | ORAL | Status: DC | PRN
Start: 2015-12-19 — End: 2015-12-21
  Administered 2015-12-20 – 2015-12-21 (×3): 0.5 mg via ORAL
  Filled 2015-12-19 (×4): qty 1

## 2015-12-19 MED ORDER — TIOTROPIUM BROMIDE MONOHYDRATE 18 MCG IN CAPS
18.0000 ug | ORAL_CAPSULE | Freq: Every day | RESPIRATORY_TRACT | Status: DC
  Administered 2015-12-20 – 2015-12-21 (×2): 18 ug via RESPIRATORY_TRACT
  Filled 2015-12-19: qty 5

## 2015-12-19 MED ORDER — STROKE: EARLY STAGES OF RECOVERY BOOK
Freq: Once | Status: AC
  Administered 2015-12-19: 23:00:00

## 2015-12-19 MED ORDER — IPRATROPIUM-ALBUTEROL 0.5-2.5 (3) MG/3ML IN SOLN: 3.0000 mL | Freq: Four times a day (QID) | RESPIRATORY_TRACT | Status: DC | PRN

## 2015-12-19 MED ORDER — GABAPENTIN 300 MG PO CAPS
600.0000 mg | ORAL_CAPSULE | Freq: Four times a day (QID) | ORAL | Status: DC
  Administered 2015-12-20 (×2): 600 mg via ORAL
  Filled 2015-12-19 (×2): qty 2

## 2015-12-19 MED ORDER — INSULIN ASPART 100 UNIT/ML ~~LOC~~ SOLN
0.0000 [IU] | Freq: Every day | SUBCUTANEOUS | Status: DC
  Administered 2015-12-20: 4 [IU] via SUBCUTANEOUS
  Administered 2015-12-20: 5 [IU] via SUBCUTANEOUS
  Filled 2015-12-19: qty 4
  Filled 2015-12-19: qty 5

## 2015-12-19 NOTE — H&P (Signed)
[facility] - [place] at [facility]   PATIENT NAME: [name]    MR#:  [id number]  DATE OF BIRTH:  1960-12-11  DATE OF ADMISSION:  12/19/2015  PRIMARY CARE PHYSICIAN: Nancy Nordmann, MD   REQUESTING/REFERRING PHYSICIAN: Huel Cote, MD  CHIEF COMPLAINT:   Chief Complaint  Patient presents with  . Weakness  . Hyperglycemia    HISTORY OF PRESENT ILLNESS:  [name]  is a 55 y.o. female who presents with Several days of persistent falls. Patient had workup in the ED which included MRI that showed acute stroke. Hospitals were called for admission.  PAST MEDICAL HISTORY:   Past Medical History:  Diagnosis Date  . Anxiety   . Asthma   . Coarse tremors   . COPD (chronic obstructive pulmonary disease) (HCC)    on 3L home o2  . 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 Hyperlipidemia   . Hypertension   . Insomnia     PAST SURGICAL HISTORY:   Past Surgical History:  Procedure Laterality Date  . BACK SURGERY     x 2 lumbar spine  . BRONCHOSCOPY    . CERVICAL SPINE SURGERY    . CHOLECYSTECTOMY    . FOOT SURGERY      SOCIAL HISTORY:   Social History  Substance Use Topics  . Smoking status: Current Every Day Smoker    Packs/day: 0.50    Types: Cigarettes  . Smokeless tobacco: Never Used  . Alcohol use No    FAMILY HISTORY:   Family History  Problem Relation Age of Onset  . CAD Mother   . Hypertension Mother   . Throat cancer Father     DRUG ALLERGIES:   Allergies  Allergen Reactions  . Lyrica [Pregabalin] Other (See Comments)    hallucination  . Prozac [Fluoxetine Hcl] Other (See Comments)    hallucinations  . Vicodin [Hydrocodone-Acetaminophen] Rash    MEDICATIONS AT HOME:   Prior to Admission medications   Medication Sig Start Date End Date Taking? Authorizing Provider  albuterol (PROVENTIL) (2.5 MG/3ML) 0.083% nebulizer solution Take 2.5 mg by nebulization every 6 (six) hours as  needed for wheezing or shortness of breath.   Yes Historical Provider, MD  albuterol-ipratropium (COMBIVENT) 18-103 MCG/ACT inhaler Inhale 1 puff into the lungs every 6 (six) hours as needed for wheezing or shortness of breath.   Yes Historical Provider, MD  ALPRAZolam Prudy Feeler) 1 MG tablet Take 1 mg by mouth 4 (four) times daily as needed for anxiety.    Yes Historical Provider, MD  amitriptyline (ELAVIL) 25 MG tablet Take 75 mg by mouth at bedtime.   Yes Historical Provider, MD  atorvastatin (LIPITOR) 40 MG tablet Take 40 mg by mouth at bedtime.   Yes Historical Provider, MD  fluticasone (FLONASE) 50 MCG/ACT nasal spray Place 2 sprays into both nostrils daily as needed for rhinitis.   Yes Historical Provider, MD  Fluticasone-Salmeterol (ADVAIR) 250-50 MCG/DOSE AEPB Inhale 1 puff into the lungs 2 (two) times daily.   Yes Historical Provider, MD  gabapentin (NEURONTIN) 300 MG capsule Take 600 mg by mouth 4 (four) times daily.    Yes Historical Provider, MD  insulin aspart (NOVOLOG FLEXPEN) 100 UNIT/ML FlexPen Inject 10 Units into the skin 3 (three) times daily before meals.   Yes Historical Provider, MD  insulin glargine (LANTUS) 100 unit/mL SOPN Inject 72 Units into the skin at bedtime.   Yes Historical Provider, MD  lisinopril (PRINIVIL,ZESTRIL)  2.5 MG tablet Take 2.5 mg by mouth daily.   Yes Historical Provider, MD  magnesium oxide (MAG-OX) 400 (241.3 Mg) MG tablet Take 400 mg by mouth at bedtime.   Yes Historical Provider, MD  oxyCODONE-acetaminophen (PERCOCET) 7.5-325 MG tablet Take 1 tablet by mouth every 4 (four) hours as needed for severe pain. Every 4-6 hours   Yes Historical Provider, MD  tiotropium (SPIRIVA) 18 MCG inhalation capsule Place 18 mcg into inhaler and inhale daily.   Yes Historical Provider, MD  traZODone (DESYREL) 100 MG tablet Take 200 mg by mouth at bedtime.    Yes Historical Provider, MD    REVIEW OF SYSTEMS:  Review of Systems  Constitutional: Negative for chills, fever,  malaise/fatigue and weight loss.  HENT: Negative for ear pain, hearing loss and tinnitus.   Eyes: Negative for blurred vision, double vision, pain and redness.  Respiratory: Negative for cough, hemoptysis and shortness of breath.   Cardiovascular: Negative for chest pain, palpitations, orthopnea and leg swelling.  Gastrointestinal: Negative for abdominal pain, constipation, diarrhea, nausea and vomiting.  Genitourinary: Negative for dysuria, frequency and hematuria.  Musculoskeletal: Positive for falls. Negative for back pain, joint pain and neck pain.  Skin:       No acne, rash, or lesions  Neurological: Negative for dizziness, tremors, focal weakness and weakness.       Ataxia  Endo/Heme/Allergies: Negative for polydipsia. Does not bruise/bleed easily.  Psychiatric/Behavioral: Negative for depression. The patient is not nervous/anxious and does not have insomnia.      VITAL SIGNS:   Vitals:   12/19/15 1458 12/19/15 1500 12/19/15 1730 12/19/15 1800  BP:  (!) 173/96 (!) 179/81 (!) 115/96  Pulse:  95    Resp:  17    Temp:      TempSrc:      SpO2:  100%    Weight: 72.6 kg (160 lb)     Height: 5\' 3"  (1.6 m)      Wt Readings from Last 3 Encounters:  12/19/15 72.6 kg (160 lb)  08/30/15 77.1 kg (170 lb)  07/18/15 80.5 kg (177 lb 7.5 oz)    PHYSICAL EXAMINATION:  Physical Exam  Vitals reviewed. Constitutional: She is oriented to person, place, and time. She appears well-developed and well-nourished. No distress.  HENT:  Head: Normocephalic and atraumatic.  Mouth/Throat: Oropharynx is clear and moist.  Eyes: Conjunctivae and EOM are normal. Pupils are equal, round, and reactive to light. No scleral icterus.  Neck: Normal range of motion. Neck supple. No JVD present. No thyromegaly present.  Cardiovascular: Normal rate, regular rhythm and intact distal pulses.  Exam reveals no gallop and no friction rub.   No murmur heard. Respiratory: Effort normal and breath sounds normal. No  respiratory distress. She has no wheezes. She has no rales.  GI: Soft. Bowel sounds are normal. She exhibits no distension. There is no tenderness.  Musculoskeletal: Normal range of motion. She exhibits no edema.  No arthritis, no gout  Lymphadenopathy:    She has no cervical adenopathy.  Neurological: She is alert and oriented to person, place, and time. No cranial nerve deficit.  Neurologic: Cranial nerves II-XII intact, Sensation intact to light touch/pinprick, 5/5 strength in all extremities, no dysarthria, no aphasia, no dysphagia, memory intact, significantly slowed rapid alternating movements, + pronator drift, DTR intact, Babinski sign not present.   Skin: Skin is warm and dry. No rash noted. No erythema.  Psychiatric: She has a normal mood and affect. Her behavior is normal.  Judgment and thought content normal.    LABORATORY PANEL:   CBC  Recent Labs Lab 12/19/15 1457  WBC 7.2  HGB 12.3  HCT 37.7  PLT 189   ------------------------------------------------------------------------------------------------------------------  Chemistries   Recent Labs Lab 12/19/15 1457  NA 139  K 4.5  CL 101  CO2 30  GLUCOSE 412*  BUN 27*  CREATININE 1.12*  CALCIUM 8.7*  AST 24  ALT 13*  ALKPHOS 121  BILITOT 0.4   ------------------------------------------------------------------------------------------------------------------  Cardiac Enzymes  Recent Labs Lab 12/19/15 1457  TROPONINI <0.03   ------------------------------------------------------------------------------------------------------------------  RADIOLOGY:  Dg Lumbar Spine 2-3 Views  Result Date: 12/19/2015 CLINICAL DATA:  Lower extremity weakness since a CVA 1 month ago. History of prior lumbar surgery. EXAM: LUMBAR SPINE - 2-3 VIEW COMPARISON:  Plain films lumbar spine 02/27/2014. FINDINGS: No acute abnormality is identified. The patient is status post L4-5 laminectomy and fusion. Loss of disc space height  is again seen at L3-4. 0.6 cm anterolisthesis L3 on L4 due to facet degenerative disease appears worse than on the prior exam. Facet arthropathy at L5-S1 is unchanged. Atherosclerotic vascular disease identified. IMPRESSION: No acute abnormality. Some progression of spondylosis at L3-4. Status post L4-5 fusion. Atherosclerosis. Electronically Signed   By: Drusilla Kanner M.D.   On: 12/19/2015 16:21   Ct Head Wo Contrast  Result Date: 12/19/2015 CLINICAL DATA:  Weakness.  CVA 1 month ago.  Hyperglycemia. EXAM: CT HEAD WITHOUT CONTRAST TECHNIQUE: Contiguous axial images were obtained from the base of the skull through the vertex without intravenous contrast. COMPARISON:  05/21/2013 FINDINGS: Brain: 3 by 4 mm lacunar infarct in the posterior limb of the left internal capsule, image 12/2, better appreciated than on the prior exam. Otherwise, the brainstem, cerebellum, cerebral peduncles, thalami, basal ganglia, basilar cisterns, and ventricular system appear within normal limits. No intracranial hemorrhage, mass lesion, or acute CVA. Vascular: Atherosclerotic calcification of the vertebral arteries. Skull: Unremarkable Sinuses/Orbits: Unremarkable Other: No supplemental non-categorized findings. IMPRESSION: 1. 3 by 4 mm hypodense lesion favoring remote lacunar infarct in the posterior limb of the left external capsule. 2. Vertebral artery atherosclerosis. Electronically Signed   By: Gaylyn Rong M.D.   On: 12/19/2015 15:48   Mr Brain Wo Contrast  Result Date: 12/19/2015 CLINICAL DATA:  Weakness after stroke 1 month ago. Diffuse pain. Ataxia. History of hypertension and diabetes. EXAM: MRI HEAD WITHOUT CONTRAST TECHNIQUE: Multiplanar, multiecho pulse sequences of the brain and surrounding structures were obtained without intravenous contrast. COMPARISON:  CT HEAD December 19, 2015 at 1534 hours FINDINGS: BRAIN: 5 mm rounded reduced diffusion RIGHT external capsule/subinsula. No susceptibility artifact to  suggest hemorrhage. The ventricles and sulci are normal for patient's age. Old LEFT posterior limb of the internal capsule lacunar infarct with T2 shine through. Scattered subcentimeter supratentorial white matter T2 hyperintensities. No suspicious parenchymal signal, masses or mass effect. No abnormal extra-axial fluid collections. No extra-axial masses though, contrast enhanced sequences would be more sensitive. VASCULAR: Normal major intracranial vascular flow voids present at skull base. SKULL AND UPPER CERVICAL SPINE: No abnormal sellar expansion. No suspicious calvarial bone marrow signal. Craniocervical junction maintained. Small amount of presumed pannus at the odontoid process mildly effaces the ventral thecal sac. SINUSES/ORBITS: Trace maxillary sinus mucosal thickening without paranasal sinus air-fluid levels. The included ocular globes and orbital contents are non-suspicious. OTHER: None. Other: Patient is edentulous. 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 Electronically Signed   By:  Awilda Metroourtnay  Bloomer M.D.   On: 12/19/2015 20:08    EKG:   Orders placed or performed during the hospital encounter of 07/17/15  . ED EKG  . ED EKG  . EKG 12-Lead  . EKG 12-Lead    IMPRESSION AND PLAN:  Principal Problem:   Stroke [facility](HCC) - admit per stroke admission order set with neurology consult, PT OT and speech eval, and appropriate labs and imaging Active Problems:   COPD (chronic obstructive pulmonary disease) (HCC) - continue home inhalers   Anxiety - continue home meds   Diabetes (HCC) - sliding scale insulin with corresponding glucose checks   Moderate recurrent major depression (HCC) - continue home meds  All the records are reviewed and case discussed with ED provider. Management plans discussed with the patient and/or family.  DVT PROPHYLAXIS: SubQ lovenox  GI PROPHYLAXIS:  None  ADMISSION STATUS: Inpatient  CODE STATUS: Full Code Status History    Date Active Date Inactive Code Status Order ID Comments User Context   07/17/2015  8:34 PM 07/23/2015  8:43 PM Full Code [id number]  Enid Baasadhika Kalisetti, MD Inpatient      TOTAL TIME TAKING CARE OF THIS PATIENT: 45 minutes.    [name] FIELDING 12/19/2015, 9:25 PM  [facility]  Office  [phone]  CC: Primary care physician; Nancy NordmannALVIN C POWELL JR, MD

## 2015-12-19 NOTE — ED Notes (Signed)
Pt transported to MRI with Judeth CornfieldStephanie, EDT

## 2015-12-19 NOTE — ED Notes (Signed)
Pt back from radiology 

## 2015-12-19 NOTE — ED Triage Notes (Signed)
Pt via ems from home with weakness since CVA 1 month ago; she also has some pain "all over." Pt generally uses O2 at night, was on 2L at arrival; trial O2 sat on RA was 86%; placed on 2L O2. Pt states she takes Lantus for DM but can't remember if she took it today. She has CBG of 416 upon arrival.

## 2015-12-20 ENCOUNTER — Inpatient Hospital Stay: Payer: Medicaid Other

## 2015-12-20 ENCOUNTER — Inpatient Hospital Stay
Admit: 2015-12-20 | Discharge: 2015-12-20 | Disposition: A | Discharge status: Home / Self Care | Payer: Medicaid Other | Attending: Internal Medicine | Admitting: Internal Medicine

## 2015-12-20 DIAGNOSIS — I639 Cerebral infarction, unspecified: Principal | ICD-10-CM

## 2015-12-20 LAB — ECHOCARDIOGRAM COMPLETE
Height: 63 in
WEIGHTICAEL: 2560 [oz_av]

## 2015-12-20 LAB — LIPID PANEL
Cholesterol: 124 mg/dL (ref 0–200)
HDL: 33 mg/dL — ABNORMAL LOW (ref >40)
LDL CALC: 40 mg/dL (ref 0–99)
TRIGLYCERIDES: 256 mg/dL — AB (ref <150)
Total CHOL/HDL Ratio: 3.8 RATIO
VLDL: 51 mg/dL — AB (ref 0–40)

## 2015-12-20 LAB — GLUCOSE, CAPILLARY
GLUCOSE-CAPILLARY: 327 mg/dL — AB (ref 65–99)
GLUCOSE-CAPILLARY: 303 mg/dL — AB (ref 65–99)
Glucose-Capillary: 174 mg/dL — ABNORMAL HIGH (ref 65–99)
Glucose-Capillary: 192 mg/dL — ABNORMAL HIGH (ref 65–99)
Glucose-Capillary: 418 mg/dL — ABNORMAL HIGH (ref 65–99)

## 2015-12-20 MED ORDER — GABAPENTIN 300 MG PO CAPS
600.0000 mg | ORAL_CAPSULE | Freq: Two times a day (BID) | ORAL | Status: DC
  Administered 2015-12-20 – 2015-12-21 (×2): 600 mg via ORAL
  Filled 2015-12-20 (×2): qty 2

## 2015-12-20 MED ORDER — ASPIRIN EC 325 MG PO TBEC
325.0000 mg | DELAYED_RELEASE_TABLET | Freq: Every day | ORAL | Status: DC
  Administered 2015-12-20 – 2015-12-21 (×2): 325 mg via ORAL
  Filled 2015-12-20 (×2): qty 1

## 2015-12-20 MED ORDER — INSULIN GLARGINE 100 UNIT/ML ~~LOC~~ SOLN
10.0000 [IU] | Freq: Every day | SUBCUTANEOUS | Status: DC
  Administered 2015-12-20: 10 [IU] via SUBCUTANEOUS
  Filled 2015-12-20 (×2): qty 0.1

## 2015-12-20 MED ORDER — GADOBENATE DIMEGLUMINE 529 MG/ML IV SOLN
20.0000 mL | Freq: Once | INTRAVENOUS | Status: AC | PRN
  Administered 2015-12-20: 15 mL via INTRAVENOUS

## 2015-12-20 MED ORDER — LORAZEPAM 2 MG/ML IJ SOLN: 0.5000 mg | Freq: Once | INTRAMUSCULAR | Status: DC

## 2015-12-20 MED ORDER — GADOBENATE DIMEGLUMINE 529 MG/ML IV SOLN: 15.0000 mL | Freq: Once | INTRAVENOUS | Status: DC | PRN

## 2015-12-20 MED ORDER — ACETAMINOPHEN 325 MG PO TABS: 650.0000 mg | ORAL_TABLET | Freq: Four times a day (QID) | ORAL | Status: DC | PRN

## 2015-12-20 NOTE — Consult Note (Signed)
Referring Physician: Luberta MutterKonidena    Chief Complaint: Frequent falls  HPI: [name] is an 55 y.o. female with multiple medical problems including an acute infarct in August of this year who presented with frequent falls.  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  MRA was unremarkable.  Vertebrals and subclavian were commented on as well and were noted to have no significant stenosis.  Was released to rehab with 4/5 right sided weakness.  Patient reports that for the past 2 weeks has been falling frequently and feels that she is dragging her right side.  Initial NIHSS of 9.    Date last known well: Unable to determine Time last known well: Unable to determine tPA Given: No: Unable to determine LKW  Past Medical History:  Diagnosis Date  . Anxiety   . Asthma   . Coarse tremors   . COPD (chronic obstructive pulmonary disease) (HCC)    on 3L home o2  . 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 Hyperlipidemia   . Hypertension   . Insomnia     Past Surgical History:  Procedure Laterality Date  . BACK SURGERY     x 2 lumbar spine  . BRONCHOSCOPY    . CERVICAL SPINE SURGERY    . CHOLECYSTECTOMY    . FOOT SURGERY      Family History  Problem Relation Age of Onset  . CAD Mother   . Hypertension Mother   . Throat cancer Father    Social History:  reports that she has been smoking Cigarettes.  She has been smoking about 0.50 packs per day. She has never used smokeless tobacco. She reports that she does not drink alcohol or use drugs.  Allergies:  Allergies  Allergen Reactions  . Lyrica [Pregabalin] Other (See Comments)    hallucination  . Prozac [Fluoxetine Hcl] Other (See Comments)    hallucinations  . Vicodin [Hydrocodone-Acetaminophen] Rash    Medications:  I have reviewed the patient's current  medications. Prior to Admission:  Prescriptions Prior to Admission  Medication Sig Dispense Refill Last Dose  . albuterol (PROVENTIL) (2.5 MG/3ML) 0.083% nebulizer solution Take 2.5 mg by nebulization every 6 (six) hours as needed for wheezing or shortness of breath.   Past Week at Unknown time  . albuterol-ipratropium (COMBIVENT) 18-103 MCG/ACT inhaler Inhale 1 puff into the lungs every 6 (six) hours as needed for wheezing or shortness of breath.   12/18/2015 at Unknown time  . ALPRAZolam (XANAX) 1 MG tablet Take 1 mg by mouth 4 (four) times daily as needed for anxiety.    12/18/2015 at Unknown time  . amitriptyline (ELAVIL) 25 MG tablet Take 75 mg by mouth at bedtime.   12/18/2015 at Unknown time  . atorvastatin (LIPITOR) 40 MG tablet Take 40 mg by mouth at bedtime.   12/18/2015 at Unknown time  . fluticasone (FLONASE) 50 MCG/ACT nasal spray Place 2 sprays into both nostrils daily as needed for rhinitis.   12/18/2015 at Unknown time  . Fluticasone-Salmeterol (ADVAIR) 250-50 MCG/DOSE AEPB Inhale 1 puff into the lungs 2 (two) times daily.   12/18/2015 at Unknown time  . gabapentin (NEURONTIN) 300 MG capsule Take 600 mg by mouth 4 (four) times daily.    12/18/2015 at Unknown time  . insulin aspart (NOVOLOG FLEXPEN) 100 UNIT/ML FlexPen Inject 10 Units into  the skin 3 (three) times daily before meals.   12/18/2015 at pm  . insulin glargine (LANTUS) 100 unit/mL SOPN Inject 72 Units into the skin at bedtime.   12/18/2015 at Unknown time  . lisinopril (PRINIVIL,ZESTRIL) 2.5 MG tablet Take 2.5 mg by mouth daily.   12/19/2015 at am  . magnesium oxide (MAG-OX) 400 (241.3 Mg) MG tablet Take 400 mg by mouth at bedtime.   12/18/2015 at pm  . oxyCODONE-acetaminophen (PERCOCET) 7.5-325 MG tablet Take 1 tablet by mouth every 4 (four) hours as needed for severe pain. Every 4-6 hours   12/18/2015 at Unknown time  . tiotropium (SPIRIVA) 18 MCG inhalation capsule Place 18 mcg into inhaler and inhale daily.    12/18/2015 at Unknown time  . traZODone (DESYREL) 100 MG tablet Take 200 mg by mouth at bedtime.    12/18/2015 at Unknown time   Scheduled: . amitriptyline  75 mg Oral QHS  . atorvastatin  40 mg Oral QHS  . enoxaparin (LOVENOX) injection  40 mg Subcutaneous Q24H  . gabapentin  600 mg Oral QID  . insulin aspart  0-5 Units Subcutaneous QHS  . insulin aspart  0-9 Units Subcutaneous TID WC  . lisinopril  2.5 mg Oral Daily  . mometasone-formoterol  2 puff Inhalation BID  . tiotropium  18 mcg Inhalation Daily    ROS: History obtained from the patient  General ROS: poor sleep Psychological ROS: negative for - behavioral disorder, hallucinations, memory difficulties, mood swings or suicidal ideation Ophthalmic ROS: negative for - blurry vision, double vision, eye pain or loss of vision ENT ROS: negative for - epistaxis, nasal discharge, oral lesions, sore throat, tinnitus or vertigo Allergy and Immunology ROS: negative for - hives or itchy/watery eyes Hematological and Lymphatic ROS: negative for - bleeding problems, bruising or swollen lymph nodes Endocrine ROS: negative for - galactorrhea, hair pattern changes, polydipsia/polyuria or temperature intolerance Respiratory ROS: negative for - cough, hemoptysis, shortness of breath or wheezing Cardiovascular ROS: negative for - chest pain, dyspnea on exertion, edema or irregular heartbeat Gastrointestinal ROS: negative for - abdominal pain, diarrhea, hematemesis, nausea/vomiting or stool incontinence Genito-Urinary ROS: negative for - dysuria, hematuria, incontinence or urinary frequency/urgency Musculoskeletal ROS: negative for - joint swelling or muscular weakness Neurological ROS: as noted in HPI Dermatological ROS: negative for rash and skin lesion changes  Physical Examination: Blood pressure (!) 148/72, pulse 77, temperature 97.5 F (36.4 C), temperature source Oral, resp. rate 20, height 5\' 3"  (1.6 m), weight 72.6 kg (160 lb), SpO2 97  %. Gen: NAD HEENT-  Normocephalic, no lesions, without obvious abnormality.  Normal external eye and conjunctiva.  Normal TM's bilaterally.  Normal auditory canals and external ears. Normal external nose, mucus membranes and septum.  Normal pharynx. Cardiovascular- S1, S2 normal, pulses palpable throughout   Lungs- chest clear, no wheezing, rales, normal symmetric air entry Abdomen- soft, non-tender; bowel sounds normal; no masses,  no organomegaly Extremities- no edema Lymph-no adenopathy palpable Musculoskeletal-no joint tenderness, deformity or swelling Skin-warm and dry, no hyperpigmentation, vitiligo, or suspicious lesions  Neurological Examination Mental Status: Lethargic, oriented, thought content appropriate.  Speech fluent without evidence of aphasia.  Able to follow 3 step commands without difficulty. Cranial Nerves: II: Discs flat bilaterally; Visual fields grossly normal, pupils equal, round, reactive to light and accommodation III,IV, VI: ptosis not present, extra-ocular motions intact bilaterally V,VII: smile symmetric, facial light touch sensation normal bilaterally VIII: hearing normal bilaterally IX,X: gag reflex present XI: bilateral shoulder shrug XII: midline tongue extension  Motor: Lifts all extremities against gravity although weakly with no focal weakness noted Sensory: Pinprick and light touch intact throughout, bilaterally Deep Tendon Reflexes: 2+ in the upper extremities and RLE, absent in the LLE Plantars: Right: mute   Left: mute Cerebellar: normal finger-to-nose and normal heel-to-shin test Gait: not tested due to safety concerns    Laboratory Studies:  Basic Metabolic Panel:  Recent Labs Lab 12/19/15 1457  NA 139  K 4.5  CL 101  CO2 30  GLUCOSE 412*  BUN 27*  CREATININE 1.12*  CALCIUM 8.7*    Liver Function Tests:  Recent Labs Lab 12/19/15 1457  AST 24  ALT 13*  ALKPHOS 121  BILITOT 0.4  PROT 7.4  ALBUMIN 3.3*   No results for  input(s): LIPASE, AMYLASE in the last 168 hours. No results for input(s): AMMONIA in the last 168 hours.  CBC:  Recent Labs Lab 12/19/15 1457  WBC 7.2  NEUTROABS 5.1  HGB 12.3  HCT 37.7  MCV 88.1  PLT 189    Cardiac Enzymes:  Recent Labs Lab 12/19/15 1457  TROPONINI <0.03    BNP: Invalid input(s): POCBNP  CBG:  Recent Labs Lab 12/19/15 1456 12/19/15 1620 12/19/15 1747 12/19/15 2314 12/20/15 0732  GLUCAP 416* 329* 198* 363* 174*    Microbiology: Results for orders placed or performed during the hospital encounter of 07/17/15  Urine culture     Status: Abnormal   Collection Time: 07/17/15  4:16 PM  Result Value Ref Range Status   Specimen Description URINE, CLEAN CATCH  Final   Special Requests NONE  Final   Culture >=100,000 COLONIES/mL ESCHERICHIA COLI (A)  Final   Report Status 07/20/2015 FINAL  Final   Organism ID, Bacteria ESCHERICHIA COLI (A)  Final      Susceptibility   Escherichia coli - MIC*    AMPICILLIN 4 SENSITIVE Sensitive     CEFAZOLIN <=4 SENSITIVE Sensitive     CEFTRIAXONE <=1 SENSITIVE Sensitive     CIPROFLOXACIN <=0.25 SENSITIVE Sensitive     GENTAMICIN <=1 SENSITIVE Sensitive     IMIPENEM <=0.25 SENSITIVE Sensitive     NITROFURANTOIN <=16 SENSITIVE Sensitive     TRIMETH/SULFA <=20 SENSITIVE Sensitive     AMPICILLIN/SULBACTAM 4 SENSITIVE Sensitive     PIP/TAZO <=4 SENSITIVE Sensitive     Extended ESBL NEGATIVE Sensitive     * >=100,000 COLONIES/mL ESCHERICHIA COLI  MRSA PCR Screening     Status: None   Collection Time: 07/18/15  7:44 AM  Result Value Ref Range Status   MRSA by PCR NEGATIVE NEGATIVE Final    Comment:        The GeneXpert MRSA Assay (FDA approved for NASAL specimens only), is one component of a comprehensive MRSA colonization surveillance program. It is not intended to diagnose MRSA infection nor to guide or monitor treatment for MRSA infections.   Culture, respiratory (NON-Expectorated)     Status: None    Collection Time: 07/18/15  9:25 AM  Result Value Ref Range Status   Specimen Description TRACHEAL ASPIRATE  Final   Special Requests Normal  Final   Gram Stain   Final    ABUNDANT WBC PRESENT,BOTH PMN AND MONONUCLEAR NO ORGANISMS SEEN    Culture   Final    Consistent with normal respiratory flora. Performed at [facility]    Report Status 07/20/2015 FINAL  Final    Coagulation Studies: No results for input(s): LABPROT, INR in the last 72 hours.  Urinalysis:  Recent Labs  Lab 12/19/15 1457  COLORURINE YELLOW*  LABSPEC 1.015  PHURINE 5.0  GLUCOSEU >500*  HGBUR 1+*  BILIRUBINUR NEGATIVE  KETONESUR NEGATIVE  PROTEINUR >500*  NITRITE NEGATIVE  LEUKOCYTESUR NEGATIVE    Lipid Panel:    Component Value Date/Time   CHOL 124 12/20/2015 0407   TRIG 256 (H) 12/20/2015 0407   HDL 33 (L) 12/20/2015 0407   CHOLHDL 3.8 12/20/2015 0407   VLDL 51 (H) 12/20/2015 0407   LDLCALC 40 12/20/2015 0407    HgbA1C:  Lab Results  Component Value Date   HGBA1C 10.8 (H) 07/17/2015    Urine Drug Screen:     Component Value Date/Time   LABOPIA POSITIVE (A) 12/19/2015 1457   COCAINSCRNUR NONE DETECTED 12/19/2015 1457   LABBENZ POSITIVE (A) 12/19/2015 1457   AMPHETMU NONE DETECTED 12/19/2015 1457   THCU NONE DETECTED 12/19/2015 1457   LABBARB NONE DETECTED 12/19/2015 1457    Alcohol Level:  Recent Labs Lab 12/19/15 1457  ETH <5    Imaging: Dg Lumbar Spine 2-3 Views  Result Date: 12/19/2015 CLINICAL DATA:  Lower extremity weakness since a CVA 1 month ago. History of prior lumbar surgery. EXAM: LUMBAR SPINE - 2-3 VIEW COMPARISON:  Plain films lumbar spine 02/27/2014. FINDINGS: No acute abnormality is identified. The patient is status post L4-5 laminectomy and fusion. Loss of disc space height is again seen at L3-4. 0.6 cm anterolisthesis L3 on L4 due to facet degenerative disease appears worse than on the prior exam. Facet arthropathy at L5-S1 is unchanged. Atherosclerotic  vascular disease identified. IMPRESSION: No acute abnormality. Some progression of spondylosis at L3-4. Status post L4-5 fusion. Atherosclerosis. Electronically Signed   By: Drusilla Kannerhomas  Dalessio M.D.   On: 12/19/2015 16:21   Ct Head Wo Contrast  Result Date: 12/19/2015 CLINICAL DATA:  Weakness.  CVA 1 month ago.  Hyperglycemia. EXAM: CT HEAD WITHOUT CONTRAST TECHNIQUE: Contiguous axial images were obtained from the base of the skull through the vertex without intravenous contrast. COMPARISON:  05/21/2013 FINDINGS: Brain: 3 by 4 mm lacunar infarct in the posterior limb of the left internal capsule, image 12/2, better appreciated than on the prior exam. Otherwise, the brainstem, cerebellum, cerebral peduncles, thalami, basal ganglia, basilar cisterns, and ventricular system appear within normal limits. No intracranial hemorrhage, mass lesion, or acute CVA. Vascular: Atherosclerotic calcification of the vertebral arteries. Skull: Unremarkable Sinuses/Orbits: Unremarkable Other: No supplemental non-categorized findings. IMPRESSION: 1. 3 by 4 mm hypodense lesion favoring remote lacunar infarct in the posterior limb of the left external capsule. 2. Vertebral artery atherosclerosis. Electronically Signed   By: Gaylyn RongWalter  Liebkemann M.D.   On: 12/19/2015 15:48   Mr Angiogram Neck W Contrast  Result Date: 12/20/2015 CLINICAL DATA:  Falls.  Acute right external capsule infarct on MRI. EXAM: MRA NECK WITHOUT AND WITH CONTRAST MRA HEAD WITHOUT CONTRAST TECHNIQUE: Multiplanar and multiecho pulse sequences of the neck were obtained without and with intravenous contrast. Angiographic images of the neck were obtained using MRA technique without and with intravenous contast.; Angiographic images of the Circle of Willis were obtained using MRA technique without intravenous contrast. CONTRAST:  15mL MULTIHANCE GADOBENATE DIMEGLUMINE 529 MG/ML IV SOLN COMPARISON:  Chest CT 09/07/2013. FINDINGS: MRA NECK FINDINGS Three vessel aortic  arch. There is an apparent severe stenosis of the proximal right subclavian artery near its origin, however the vessel was widely patent on a 2015 chest CT and the current appearance could be artifactual. The cervical carotid arteries are widely patent without evidence of stenosis or dissection. The  vertebral arteries are patent and codominant with antegrade flow bilaterally. The vertebral artery origins are not well evaluated bilaterally due to mild motion artifact, and underlying stenosis is not excluded. There is no evidence of significant vertebral artery stenosis elsewhere. MRA HEAD FINDINGS The the visualized distal vertebral arteries are widely patent to the basilar, tortuous, and codominant. Right PICA origin is patent. Left PICA is not clearly identified. AICA and SCA origins are patent. Basilar artery is widely patent. PCAs are patent without evidence of significant stenosis. The internal carotid arteries are patent from skullbase to carotid termini without evidence of significant stenosis allowing for mild motion artifact in the supraclinoid segments. ACAs and MCAs are patent without evidence of major branch occlusion or significant proximal stenosis. The right A1 segment is mildly dominant. No intracranial aneurysm is identified. IMPRESSION: 1. No evidence of major intracranial branch occlusion or significant stenosis. 2. Widely patent cervical carotid arteries. 3. Possible severe proximal right subclavian artery stenosis. However, a normal appearance on a 2015 chest CT and the lack of significant stenosis elsewhere may indicate that this appearance is at least partly artifactual in nature. Consider neck CTA for further assessment of the right subclavian artery as well as the vertebral artery origins which were not well evaluated on this study. Electronically Signed   By: Sebastian Ache M.D.   On: 12/20/2015 11:25   Mr Brain Wo Contrast  Result Date: 12/19/2015 CLINICAL DATA:  Weakness after stroke 1  month ago. Diffuse pain. Ataxia. History of hypertension and diabetes. EXAM: MRI HEAD WITHOUT CONTRAST TECHNIQUE: Multiplanar, multiecho pulse sequences of the brain and surrounding structures were obtained without intravenous contrast. COMPARISON:  CT HEAD December 19, 2015 at 1534 hours FINDINGS: BRAIN: 5 mm rounded reduced diffusion RIGHT external capsule/subinsula. No susceptibility artifact to suggest hemorrhage. The ventricles and sulci are normal for patient's age. Old LEFT posterior limb of the internal capsule lacunar infarct with T2 shine through. Scattered subcentimeter supratentorial white matter T2 hyperintensities. No suspicious parenchymal signal, masses or mass effect. No abnormal extra-axial fluid collections. No extra-axial masses though, contrast enhanced sequences would be more sensitive. VASCULAR: Normal major intracranial vascular flow voids present at skull base. SKULL AND UPPER CERVICAL SPINE: No abnormal sellar expansion. No suspicious calvarial bone marrow signal. Craniocervical junction maintained. Small amount of presumed pannus at the odontoid process mildly effaces the ventral thecal sac. SINUSES/ORBITS: Trace maxillary sinus mucosal thickening without paranasal sinus air-fluid levels. The included ocular globes and orbital contents are non-suspicious. OTHER: None. Other: Patient is edentulous. 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 Electronically Signed   By: Awilda Metro M.D.   On: 12/19/2015 20:08   Mr Maxine Glenn Head/brain ZO Cm  Result Date: 12/20/2015 CLINICAL DATA:  Falls.  Acute right external capsule infarct on MRI. EXAM: MRA NECK WITHOUT AND WITH CONTRAST MRA HEAD WITHOUT CONTRAST TECHNIQUE: Multiplanar and multiecho pulse sequences of the neck were obtained without and with intravenous contrast. Angiographic images of the neck were obtained using MRA technique  without and with intravenous contast.; Angiographic images of the Circle of Willis were obtained using MRA technique without intravenous contrast. CONTRAST:  15mL MULTIHANCE GADOBENATE DIMEGLUMINE 529 MG/ML IV SOLN COMPARISON:  Chest CT 09/07/2013. FINDINGS: MRA NECK FINDINGS Three vessel aortic arch. There is an apparent severe stenosis of the proximal right subclavian artery near its origin, however the vessel was widely patent on a 2015 chest CT and the current  appearance could be artifactual. The cervical carotid arteries are widely patent without evidence of stenosis or dissection. The vertebral arteries are patent and codominant with antegrade flow bilaterally. The vertebral artery origins are not well evaluated bilaterally due to mild motion artifact, and underlying stenosis is not excluded. There is no evidence of significant vertebral artery stenosis elsewhere. MRA HEAD FINDINGS The the visualized distal vertebral arteries are widely patent to the basilar, tortuous, and codominant. Right PICA origin is patent. Left PICA is not clearly identified. AICA and SCA origins are patent. Basilar artery is widely patent. PCAs are patent without evidence of significant stenosis. The internal carotid arteries are patent from skullbase to carotid termini without evidence of significant stenosis allowing for mild motion artifact in the supraclinoid segments. ACAs and MCAs are patent without evidence of major branch occlusion or significant proximal stenosis. The right A1 segment is mildly dominant. No intracranial aneurysm is identified. IMPRESSION: 1. No evidence of major intracranial branch occlusion or significant stenosis. 2. Widely patent cervical carotid arteries. 3. Possible severe proximal right subclavian artery stenosis. However, a normal appearance on a 2015 chest CT and the lack of significant stenosis elsewhere may indicate that this appearance is at least partly artifactual in nature. Consider neck CTA for  further assessment of the right subclavian artery as well as the vertebral artery origins which were not well evaluated on this study. Electronically Signed   By: Sebastian Ache M.D.   On: 12/20/2015 11:25    Assessment: 55 y.o. female presenting with frequent falls.  Evaluated for acute infarct in August of this year.  MRI of the brain performed during this admission and reviewed.  MRI shows an acute right external capsular infarct.  This is not consistent with patient's complaints of dragging her right but this infarct and those noted in August consistent with small vessel disease.  Patient with poorly controlled risk factors.  MRA shows question of subclavian stenosis.  This was specifically commented on with imaging in August and noted to be patent.  Artifact likely.  Echocardiogram and A1c pending.  LDL 40.  Patient on no antiplatelet therapy.    Stroke Risk Factors - diabetes mellitus, hyperlipidemia, hypertension and smoking  Plan: 1. PT consult, OT consult, Speech consult 2. Prophylactic therapy-Antiplatelet med: Aspirin - dose 325mg  daily 3. Telemetry monitoring 4. Frequent neuro checks 5. Smoking cessation counseling 6. Patient lethargic.  Would D/C Elavil and half Neurontin to 600mg  BID with consideration for discontinuation as well if no improvement.   Thana Farr, MD Neurology [phone] 12/20/2015, 11:50 AM

## 2015-12-20 NOTE — Evaluation (Signed)
Physical Therapy Evaluation Patient Details Name: Teresa BastDeborah W Collier MRN: [id number] DOB: 1960-12-27 Today's Date: 12/20/2015   History of Present Illness  Pt is a [age] y.o. female w/ PMH of anxiety/depression, recurrent major depression, grief at loss of child recently, HTN, COPD, DM who presents with some persistent weakness and ataxia over the past month. Patient states that she had a stroke and records show that she did have a cerebrovascular accident approximately 3 months ago. She states over the past month she's had increasing difficulty ambulating at home without headache with several falls. She admits to being noncompliant with her blood sugar and also her home oxygen therapy stating she uses it only at night. She still continues to smoke. Pt is edentulous which impacts articulation of speech with slightly mumbled speech with drowsiness most likely from meds. 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  MRI indicated a new CVA in R external capsule.  Clinical Impression  Prior to hospital admission, pt was modified independent with functional mobility using a RW (although pt reporting multiple recent falls).  Pt lives with her oldest daughter in 1 level home with no stairs to enter.  Currently pt is min assist supine to sit; min assist to stand; and CGA to min assist to ambulate 50 feet with RW.  Distance limited ambulating d/t B knees buckling requiring mod assist of therapist to keep pt upright/standing.  Pt assisted with toileting and pt had difficulty pulling up her underwear in standing d/t knees flexing and buckling requiring assist to sit back onto commode safely (pt also unsteady with clothing management for toileting).  Pt appearing impulsive and with decreased safety awareness and decreased awareness of deficits during session.  Pt would benefit from  skilled PT to address noted impairments and functional limitations.  Recommend pt discharge to STR when medically appropriate.    Follow Up Recommendations SNF    Equipment Recommendations   (pt already owns RW )    Recommendations for Other Services       Precautions / Restrictions Precautions Precautions: Fall Precaution Comments: Aspiration Restrictions Weight Bearing Restrictions: No      Mobility  Bed Mobility Overal bed mobility: Needs Assistance Bed Mobility: Supine to Sit     Supine to sit: Min assist;HOB elevated     General bed mobility comments: assist for trunk; increased time to perform; vc's for technique/sequencing  Transfers Overall transfer level: Needs assistance Equipment used: Rolling walker (2 wheeled) Transfers: Sit to/from [employer]Stand;Stand Pivot Transfers Sit to Stand: Min assist Stand pivot transfers: Min assist (recliner to/from commode)       General transfer comment: pt with difficulty coming to full upright posture d/t chronic low back pain; vc's for hand placement required  Ambulation/Gait Ambulation/Gait assistance: Min guard;Min assist (chair follow for safety) Ambulation Distance (Feet): 50 Feet Assistive device: Rolling walker (2 wheeled)   Gait velocity: decreased   General Gait Details: decreased B step length/foot clearance/heelstrike; flexed posture leaning onto RW; limited distance d/t B knees buckling requiring mod assist of therapist to remain upright  Stairs            Wheelchair Mobility    Modified Rankin (Stroke Patients Only)       Balance Overall balance assessment: Needs assistance Sitting-balance support: Bilateral upper extremity supported;Feet supported Sitting balance-Leahy Scale: Fair  Sitting balance - Comments: static sitting   Standing balance support: Bilateral upper extremity supported (on RW) Standing balance-Leahy Scale: Fair Standing balance comment: static standing                              Pertinent Vitals/Pain Pain Assessment: 0-10 Pain Score: 6  Pain Location: low back Pain Descriptors / Indicators: Aching;Constant;Radiating Pain Intervention(s): Limited activity within patient's tolerance;Monitored during session;RN gave pain meds during session;Patient requesting pain meds-RN notified;Repositioned  Vitals (HR and O2) stable and WFL throughout treatment session.     Home Living Family/patient expects to be discharged to:: Private residence Living Arrangements: Children (Lives with oldest daughter Engineer, manufacturing) Available Help at Discharge: Family Type of Home: House Home Access: Level entry     Home Layout: One level Home Equipment: Environmental consultant - 2 wheels;Bedside commode;Shower seat;Cane - single point;Wheelchair - manual Additional Comments: Pt would like to go home but knows she needs to get stronger and is willing to go to rehab    Prior Function Level of Independence: Independent with assistive device(s)         Comments: Chronic back pain limits her ability to get out of the chair and out of the house.  Pt reports multiple recent falls causing R hip soreness.  Pt utilizes RW for ambulation.     Hand Dominance   Dominant Hand: Right    Extremity/Trunk Assessment   Upper Extremity Assessment: Defer to OT evaluation RUE Deficits / Details: rotator cuff pain which limits shoulder flexion to about 80 degrees; minimal IR/ER; decreased coordination but intact sensation; 4/5 strength     LUE Deficits / Details: rotator cuff pain which limits shoulder flexion to about 80 degrees; minimal IR/ER; decreased coordination but intact sensation; 4-/5 strength   Lower Extremity Assessment: RLE deficits/detail;LLE deficits/detail RLE Deficits / Details: R hip flexion 4/5; R knee flexion/extension 4+/5; R DF 4/5 LLE Deficits / Details: L hip flexion 4-/5; L knee flexion/extension 4-/5; L DF 4-/5     Communication   Communication: No difficulties  Cognition  Arousal/Alertness: Lethargic;Suspect due to medications Behavior During Therapy: Flat affect;Impulsive Overall Cognitive Status: Within Functional Limits for tasks assessed       Memory: Decreased short-term memory (Decreased safety awareness)              General Comments General comments (skin integrity, edema, etc.): Pt eager to get OOB and agreeable to PT.    Exercises     Assessment/Plan    PT Assessment Patient needs continued PT services  PT Problem List Decreased strength;Decreased activity tolerance;Decreased balance;Decreased mobility;Decreased safety awareness;Pain          PT Treatment Interventions DME instruction;Gait training;Functional mobility training;Therapeutic activities;Therapeutic exercise;Balance training;Patient/family education;Neuromuscular re-education    PT Goals (Current goals can be found in the Care Plan section)  Acute Rehab PT Goals Patient Stated Goal: to be able to walk more PT Goal Formulation: With patient Time For Goal Achievement: 01/03/16 Potential to Achieve Goals: Good    Frequency 7X/week   Barriers to discharge Decreased caregiver support      Co-evaluation               End of Session Equipment Utilized During Treatment: Gait belt Activity Tolerance: Patient limited by fatigue Patient left: in chair;with call bell/phone within reach;with chair alarm set Nurse Communication: Mobility status;Precautions         Time: 1335-1405 PT Time Calculation (min) (ACUTE ONLY):  30 min   Charges:   PT Evaluation $PT Eval Low Complexity: 1 Procedure PT Treatments $Therapeutic Activity: 8-22 mins   PT G CodesHendricks Limes:        Kaliann Coryell 12/20/2015, 4:11 PM Hendricks LimesEmily Alexus Michael, PT [phone]

## 2015-12-20 NOTE — Progress Notes (Signed)
  Echocardiogram 2D Echocardiogram has been performed.  Teresa SavoyCasey N Alishea Beaudin 12/20/2015, 8:45 AM

## 2015-12-20 NOTE — Evaluation (Signed)
Clinical/Bedside Swallow Evaluation Patient Details  Name: [name] MRN: [id number] Date of Birth: 1960-08-29  Today's Date: 12/20/2015 Time: SLP Start Time (ACUTE ONLY): 1030 SLP Stop Time (ACUTE ONLY): 1130 SLP Time Calculation (min) (ACUTE ONLY): 60 min  Past Medical History:  Past Medical History:  Diagnosis Date  . Anxiety   . Asthma   . Coarse tremors   . COPD (chronic obstructive pulmonary disease) (HCC)    on 3L home o2  . 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 Hyperlipidemia   . Hypertension   . Insomnia    Past Surgical History:  Past Surgical History:  Procedure Laterality Date  . BACK SURGERY     x 2 lumbar spine  . BRONCHOSCOPY    . CERVICAL SPINE SURGERY    . CHOLECYSTECTOMY    . FOOT SURGERY     HPI:  Pt is a 55 y.o. female w/ PMH of anxiety/depression, recurrent major depression, grief at loss of child recently, HTN, COPD, DM who presents with some persistent weakness and ataxia over the past month. Patient states that she had a stroke and records show that she did have a cerebrovascular accident approximately 3 months ago. She states over the past month she's had increasing difficulty ambulating at home without headache. She does admit to taking multiple pain medications and antianxiety medications at home and arrives somewhat groggy. Does not appear to have any focal neurologic deficits. She admits to being noncompliant with her blood sugar and also her home oxygen therapy. She still continues to smoke. Pt denied any speech or swallowing deficits. Pt is edentulous(can impact articulation of speech) and seems to have slightly mumbled speech at this time d/t drowsiness, overall weakness w/ hospitalization. No Cognitive-Linguistic deficits noted.   Assessment / Plan / Recommendation Clinical Impression  Pt appeared to adequately tolerate trials of thin liquids via straw and puree/soft solids(moistened well) w/ no overt s/s of  aspriation noted; no decline in respiratory status or change in vocal quality during/post trials. Oral phase appeared wfl w/ trials given. Pt is edentulous but manages to gum/mash soft foods adequately during oral intake. Edentulous status can also impact pt's articulation of speech as well does drowsiness from medications(recently given by NSG). Pt appears at her baseline for swallowing and denies any deficits swallowing; NSG endorsed stating pt swallowed pills w/ water earlier today. Pt appears at her baseline w/ no gross Cognitive-linguistic deficits noted; speech intelligible and improves when pt increases her effort during talking. Recommend continue w/ current diet as ordered w/ tougher meats cut/moist; general aspiration precautions. Recommend f/u upon discharge home if any change in speech that is not her baseline. Pt agreed; NSG updated.     Aspiration Risk   (reduced)    Diet Recommendation  regular(meats cut, moist); thin liquids. General aspiration precautions.   Medication Administration: Whole meds with liquid (in puree if needed for easier swallowing)    Other  Recommendations Oral Care Recommendations: Oral care BID;Staff/trained caregiver to provide oral care   Follow up Recommendations None (TBD)      Frequency and Duration            Prognosis Prognosis for Safe Diet Advancement: Good      Swallow Study   General Date of Onset: 12/19/15 HPI: Pt is a 55 y.o. female w/ PMH of anxiety/depression, recurrent major depression, grief at loss of child recently, HTN, COPD, DM who presents with some persistent weakness and ataxia over  the past month. Patient states that she had a stroke and records show that she did have a cerebrovascular accident approximately 3 months ago. She states over the past month she's had increasing difficulty ambulating at home without headache. She does admit to taking multiple pain medications and antianxiety medications at home and arrives somewhat  groggy. Does not appear to have any focal neurologic deficits. She admits to being noncompliant with her blood sugar and also her home oxygen therapy. She still continues to smoke. Pt denied any speech or swallowing deficits. Pt is edentulous(can impact articulation of speech) and seems to have slightly mumbled speech at this time d/t drowsiness, overall weakness w/ hospitalization. No Cognitive-Linguistic deficits noted. Type of Study: Bedside Swallow Evaluation Previous Swallow Assessment: none indicated Diet Prior to this Study: Regular;Thin liquids (cuts tougher meats ) Temperature Spikes Noted: No (wbc 7.2) Respiratory Status: Nasal cannula (2 liters) History of Recent Intubation: No Behavior/Cognition: Alert;Cooperative;Pleasant mood Oral Cavity Assessment: Within Functional Limits Oral Care Completed by SLP: Recent completion by staff Oral Cavity - Dentition: Edentulous (is not wearing her dentures d/t ill-fitting) Vision: Functional for self-feeding Self-Feeding Abilities: Able to feed self;Needs set up (min) Patient Positioning: Upright in bed Baseline Vocal Quality: Normal Volitional Cough: Strong Volitional Swallow: Able to elicit    Oral/Motor/Sensory Function Overall Oral Motor/Sensory Function: Within functional limits   Ice Chips Ice chips: Not tested   Thin Liquid Thin Liquid: Within functional limits Presentation: Self Fed;Straw (10+ trials, including soda)    Nectar Thick Nectar Thick Liquid: Not tested   Honey Thick Honey Thick Liquid: Not tested   Puree Puree: Within functional limits Presentation: Self Fed;Spoon (3 trials)   Solid   GO   Solid: Within functional limits Presentation: Self Fed;Spoon (1 trial - softened)         Jerilynn SomKatherine Watson, MS, CCC-SLP Watson,Katherine 12/20/2015,1:37 PM

## 2015-12-20 NOTE — ED Provider Notes (Signed)
Time Seen: Approximately 1503  I have reviewed the triage notes  Chief Complaint: Weakness and Hyperglycemia   History of Present Illness: Marlene BastDeborah W Henri is a 55 y.o. female who presents with some persistent weakness and ataxia over the past month. Patient states that she had a stroke and records show that she did have a cerebrovascular accident approximately 3 months ago. She states over the past month she's had increasing difficulty ambulating at home without headache. She does admit to taking multiple pain medications and antianxiety medications at home and arrives somewhat groggy. But she does have at the bedside with tense to ambulate to the toe with some significant ataxia and took 2 of us to study the patient. His not appear to have any focal neurologic deficits. She admits to being noncompliant with her blood sugar and also her home oxygen therapy. She still continues to smoke.  Patient's social situation is that she does stay at home and live independently and states she does have a daughter in the area Past Medical History:  Diagnosis Date  . Anxiety   . Asthma   . Coarse tremors   . COPD (chronic obstructive pulmonary disease) (HCC)    on 3L home o2  . 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 Hyperlipidemia   . Hypertension   . Insomnia     Patient Active Problem List   Diagnosis Date Noted  . Stroke (HCC) 12/19/2015  . Diabetes (HCC) 12/19/2015  . Anxiety 08/31/2015  . Depression 08/31/2015  . Moderate recurrent major depression (HCC) 08/31/2015  . Grief at loss of child 07/23/2015  . Acute respiratory failure (HCC)   . COPD (chronic obstructive pulmonary disease) (HCC) 07/17/2015    Past Surgical History:  Procedure Laterality Date  . BACK SURGERY     x 2 lumbar spine  . BRONCHOSCOPY    . CERVICAL SPINE SURGERY    . CHOLECYSTECTOMY    . FOOT SURGERY      Past Surgical History:  Procedure Laterality Date  . BACK SURGERY      x 2 lumbar spine  . BRONCHOSCOPY    . CERVICAL SPINE SURGERY    . CHOLECYSTECTOMY    . FOOT SURGERY        Allergies:  Lyrica [pregabalin]; Prozac [fluoxetine hcl]; and Vicodin [hydrocodone-acetaminophen]  Family History: Family History  Problem Relation Age of Onset  . CAD Mother   . Hypertension Mother   . Throat cancer Father     Social History: Social History  Substance Use Topics  . Smoking status: Current Every Day Smoker    Packs/day: 0.50    Types: Cigarettes  . Smokeless tobacco: Never Used  . Alcohol use No     Review of Systems:   10 point review of systems was performed and was otherwise negative:  Constitutional: No fever Eyes: No visual disturbances ENT: No sore throat, ear pain Cardiac: No chest pain Respiratory: NoNew shortness of breath, wheezing, or stridor Abdomen: No abdominal pain, no vomiting, No diarrhea Endocrine: No weight loss, No night sweats Extremities: No peripheral edema, cyanosis Skin: No rashes, easy bruising Neurologic: No focal weakness, trouble with speech or swollowing Urologic: No dysuria, Hematuria, or urinary frequency   Physical Exam:  ED Triage Vitals  Enc Vitals Group     BP 12/19/15 1457 (!) 162/78     Pulse Rate 12/19/15 1457 95     Resp 12/19/15 1457 16     Temp  12/19/15 1457 97.9 F (36.6 C)     Temp Source 12/19/15 1457 Oral     SpO2 12/19/15 1457 97 %     Weight 12/19/15 1458 160 lb (72.6 kg)     Height 12/19/15 1458 5\' 3"  (1.6 m)     Head Circumference --      Peak Flow --      Pain Score 12/19/15 1458 8     Pain Loc --      Pain Edu? --      Excl. in GC? --     General:Patient's drowsy though seemingly able to answer most questions appropriately. She is oriented 2 and unaware of the date. Head: Normal cephalic , atraumatic Eyes: Pupils equal , round, reactive to light Nose/Throat: No nasal drainage, patent upper airway without erythema or exudate.  Neck: Supple, Full range of motion, No  anterior adenopathy or palpable thyroid masses Lungs: Clear to ascultation without wheezes , rhonchi, or rales Heart: Regular rate, regular rhythm without murmurs , gallops , or rubs Abdomen: Soft, non tender without rebound, guarding , or rigidity; bowel sounds positive and symmetric in all 4 quadrants. No organomegaly .        Extremities: 2 plus symmetric pulses. No edema, clubbing or cyanosis Neurologic: Patient's ataxic with ambulation and appears to have some left lower extremity compared to the right motor weakness. Upper extremities seem to be equal  Skin: warm, dry, no rashes   Labs:   All laboratory work was reviewed including any pertinent negatives or positives listed below:  Labs Reviewed  GLUCOSE, CAPILLARY - Abnormal; Notable for the following:       Result Value   Glucose-Capillary 416 (*)    All other components within normal limits  CBC WITH DIFFERENTIAL/PLATELET - Abnormal; Notable for the following:    RDW 15.1 (*)    All other components within normal limits  COMPREHENSIVE METABOLIC PANEL - Abnormal; Notable for the following:    Glucose, Bld 412 (*)    BUN 27 (*)    Creatinine, Ser 1.12 (*)    Calcium 8.7 (*)    Albumin 3.3 (*)    ALT 13 (*)    GFR calc non Af Amer 54 (*)    All other components within normal limits  URINALYSIS COMPLETEWITH MICROSCOPIC ([facility] ONLY) - Abnormal; Notable for the following:    Color, Urine YELLOW (*)    APPearance CLEAR (*)    Glucose, UA >500 (*)    Hgb urine dipstick 1+ (*)    Protein, ur >500 (*)    Bacteria, UA FEW (*)    Squamous Epithelial / LPF 0-5 (*)    All other components within normal limits  URINE DRUG SCREEN, QUALITATIVE ([facility] ONLY) - Abnormal; Notable for the following:    Tricyclic, Ur Screen POSITIVE (*)    Opiate, Ur Screen POSITIVE (*)    Benzodiazepine, Ur Scrn POSITIVE (*)    All other components within normal limits  GLUCOSE, CAPILLARY - Abnormal; Notable for the following:    Glucose-Capillary 329  (*)    All other components within normal limits  BLOOD GAS, ARTERIAL - Abnormal; Notable for the following:    pCO2 arterial 55 (*)    pO2, Arterial 76 (*)    Bicarbonate 33.3 (*)    Acid-Base Excess 6.9 (*)    All other components within normal limits  GLUCOSE, CAPILLARY - Abnormal; Notable for the following:    Glucose-Capillary 198 (*)  All other components within normal limits  GLUCOSE, CAPILLARY - Abnormal; Notable for the following:    Glucose-Capillary 363 (*)    All other components within normal limits  ETHANOL  TROPONIN I  TSH  HEMOGLOBIN A1C  LIPID PANEL   Radiology:  "Dg Lumbar Spine 2-3 Views  Result Date: 12/19/2015 CLINICAL DATA:  Lower extremity weakness since a CVA 1 month ago. History of prior lumbar surgery. EXAM: LUMBAR SPINE - 2-3 VIEW COMPARISON:  Plain films lumbar spine 02/27/2014. FINDINGS: No acute abnormality is identified. The patient is status post L4-5 laminectomy and fusion. Loss of disc space height is again seen at L3-4. 0.6 cm anterolisthesis L3 on L4 due to facet degenerative disease appears worse than on the prior exam. Facet arthropathy at L5-S1 is unchanged. Atherosclerotic vascular disease identified. IMPRESSION: No acute abnormality. Some progression of spondylosis at L3-4. Status post L4-5 fusion. Atherosclerosis. Electronically Signed   By: Drusilla Kanner M.D.   On: 12/19/2015 16:21   Ct Head Wo Contrast  Result Date: 12/19/2015 CLINICAL DATA:  Weakness.  CVA 1 month ago.  Hyperglycemia. EXAM: CT HEAD WITHOUT CONTRAST TECHNIQUE: Contiguous axial images were obtained from the base of the skull through the vertex without intravenous contrast. COMPARISON:  05/21/2013 FINDINGS: Brain: 3 by 4 mm lacunar infarct in the posterior limb of the left internal capsule, image 12/2, better appreciated than on the prior exam. Otherwise, the brainstem, cerebellum, cerebral peduncles, thalami, basal ganglia, basilar cisterns, and ventricular system appear  within normal limits. No intracranial hemorrhage, mass lesion, or acute CVA. Vascular: Atherosclerotic calcification of the vertebral arteries. Skull: Unremarkable Sinuses/Orbits: Unremarkable Other: No supplemental non-categorized findings. IMPRESSION: 1. 3 by 4 mm hypodense lesion favoring remote lacunar infarct in the posterior limb of the left external capsule. 2. Vertebral artery atherosclerosis. Electronically Signed   By: Gaylyn Rong M.D.   On: 12/19/2015 15:48   Mr Brain Wo Contrast  Result Date: 12/19/2015 CLINICAL DATA:  Weakness after stroke 1 month ago. Diffuse pain. Ataxia. History of hypertension and diabetes. EXAM: MRI HEAD WITHOUT CONTRAST TECHNIQUE: Multiplanar, multiecho pulse sequences of the brain and surrounding structures were obtained without intravenous contrast. COMPARISON:  CT HEAD December 19, 2015 at 1534 hours FINDINGS: BRAIN: 5 mm rounded reduced diffusion RIGHT external capsule/subinsula. No susceptibility artifact to suggest hemorrhage. The ventricles and sulci are normal for patient's age. Old LEFT posterior limb of the internal capsule lacunar infarct with T2 shine through. Scattered subcentimeter supratentorial white matter T2 hyperintensities. No suspicious parenchymal signal, masses or mass effect. No abnormal extra-axial fluid collections. No extra-axial masses though, contrast enhanced sequences would be more sensitive. VASCULAR: Normal major intracranial vascular flow voids present at skull base. SKULL AND UPPER CERVICAL SPINE: No abnormal sellar expansion. No suspicious calvarial bone marrow signal. Craniocervical junction maintained. Small amount of presumed pannus at the odontoid process mildly effaces the ventral thecal sac. SINUSES/ORBITS: Trace maxillary sinus mucosal thickening without paranasal sinus air-fluid levels. The included ocular globes and orbital contents are non-suspicious. OTHER: None. Other: Patient is edentulous. IMPRESSION: Acute sub cm RIGHT  external capsule/subinsula infarct. 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 Electronically Signed   By: Awilda Metro M.D.   On: 12/19/2015 20:08  "  I personally reviewed the radiologic studies     ED Course:  Patient was observed here for a significant period of time and had a head CT to rule out either intracranial hemorrhage, etc or any evidence of an acute  stroke. Head CT did not show any evidence of any acute or subacute pathology and showed the previous cerebrovascular accident. Patient then underwent an MRI which did show a acute versus subacute right-sided cerebrovascular accident. The patient seems to be more awake and alert which may be the substances that she's had prior to arrival. Her blood gas does not show an increased bicarbonate level at this time and she was stable on her normal supplemental 2 L nasal cannula. Due to her ataxia and her previous fall risk with now what appears to be a new cerebrovascular accident, I felt patient required further inpatient evaluation and testing and possibly physical therapy, etc. Clinical Course      Assessment:  Acute versus subacute cerebrovascular accident Noncompliance Hyperglycemia Type 2 insulin-dependent diabetes History of recent falls  *   Final Clinical Impression:   Final diagnoses:  Stroke [facility])  Stroke [facility])     Plan:  Inpatient management              Jennye Moccasin, MD 12/20/15 0025

## 2015-12-20 NOTE — Evaluation (Signed)
Occupational Therapy Evaluation Patient Details Name: Teresa BastDeborah W Rowles MRN: [id number] DOB: 02/03/1961 Today's Date: 12/20/2015    History of Present Illness Pt is a 55 y.o. female w/ PMH of anxiety/depression, recurrent major depression, grief at loss of child recently, HTN, COPD, DM who presents with some persistent weakness and ataxia over the past month. Patient states that she had a stroke and records show that she did have a cerebrovascular accident approximately 3 months ago. She states over the past month she's had increasing difficulty ambulating at home without headache with several falls. She admits to being noncompliant with her blood sugar and also her home oxygen therapy stating she uses it only at night. She still continues to smoke. Pt is edentulous which impacts articulation of speech with slightly mumbled speech with drowsiness most likely from meds. She had a CVA in R internal capsule and R thalamus in Aug 2017 and [facility] and started to have R sided weainess and falling for 2 weeks and dragging her R side.  MRI indicated a new CVA in R external capsule.   Clinical Impression   Pt is 55 year old female who presents with new R external capsule CVA.  She lives at home with her oldest daughter Aggie CosierCrystal and was independent in bathing and dressing skills but needed assist with cooking, cleaning and grocery shopping tasks. She currently requires mod assist for LB dressing skills sitting in chair mainly when standing for balance to simulate pulling pants over hips due to forward trunk flexion and decreased strength for tall stance and decreased ability to let go of FWW to pull pants up for very long before she is fatigued or loses balance.  Pt requires extra time to process and does not appear aware of limitations with flat affect.  She also has poor safety awareness and problem solving skills as well as decreased balance which puts her at risk for falls.  She also has  chronic back pain which decreases her tolerance for sitting up in chair.  She would benefit from skilled OT services to increase independence and safety during ADLs, energy conservation training, balance training and fine motor skills training and exercises to improve endurance during ADLs.  Rec SNF after discharge to continue rehab.      Follow Up Recommendations  SNF    Equipment Recommendations       Recommendations for Other Services       Precautions / Restrictions Precautions Precautions: Fall Restrictions Weight Bearing Restrictions: No      Mobility Bed Mobility                  Transfers                      Balance                                            ADL Overall ADL's : Needs assistance/impaired Eating/Feeding: Set up;Minimal assistance Eating/Feeding Details (indicate cue type and reason): pt with orange liquid down front of gown Grooming: Wash/dry hands;Wash/dry face;Oral care;Applying deodorant;Brushing hair;Set up;Minimal assistance;Sitting Grooming Details (indicate cue type and reason): extra time to complete         Upper Body Dressing : Set up;Minimal assistance   Lower Body Dressing: Cueing for safety;Set up;Sit to/from stand;Moderate assistance Lower Body Dressing Details (  indicate cue type and reason): mod assist mainly when standing for balance to simulate pulling pants over hips due to forward trunk flexion and decreased strength for tall stance and decreased ability to let go of FWW to pull pants up for very long before she is fatigued or loses balance               General ADL Comments: Pt not aware of limitations and has a delay in processing speed during eval with flat affect.  Poor safety awareness and problem solving skills as well as decreased balance which poses a fall risk.     Vision     Perception     Praxis      Pertinent Vitals/Pain Pain Assessment: 0-10 Pain Score: 6  Pain  Location: low back Pain Descriptors / Indicators: Aching;Constant;Radiating Pain Intervention(s): Limited activity within patient's tolerance;Monitored during session;RN gave pain meds during session;Patient requesting pain meds-RN notified;Repositioned     Hand Dominance Right   Extremity/Trunk Assessment Upper Extremity Assessment Upper Extremity Assessment: Generalized weakness;RUE deficits/detail;LUE deficits/detail RUE Deficits / Details: rotator cuff pain which limits shoulder flexion to about 80 degrees; minimal IR/ER; decreased coordination but intact sensation; 4/5 strength RUE Coordination: decreased fine motor;decreased gross motor LUE Deficits / Details: rotator cuff pain which limits shoulder flexion to about 80 degrees; minimal IR/ER; decreased coordination but intact sensation; 4-/5 strength LUE Coordination: decreased fine motor;decreased gross motor   Lower Extremity Assessment Lower Extremity Assessment: Defer to PT evaluation       Communication Communication Communication: No difficulties   Cognition Arousal/Alertness: Lethargic;Suspect due to medications Behavior During Therapy: Flat affect;Impulsive Overall Cognitive Status: Within Functional Limits for tasks assessed       Memory: Decreased short-term memory (decreased safety awareness)             General Comments       Exercises       Shoulder Instructions      Home Living Family/patient expects to be discharged to:: Private residence Living Arrangements: Children (lives with oldest daughter Crystal) Available Help at Discharge: Family Type of Home: House Home Access: Level entry     Home Layout: One level     Bathroom Shower/Tub: Tub/shower unit Shower/tub characteristics: Curtain FirefighterBathroom Toilet: Standard Bathroom Accessibility: Yes   Home Equipment: Environmental consultantWalker - 2 wheels;Bedside commode;Shower seat   Additional Comments: Pt would like to go home but knows she needs to get stronger  and is willing to go to rehab      Prior Functioning/Environment Level of Independence: Independent with assistive device(s)        Comments: Chronic back pain limits her ability to get out of the chair and out of the house.        OT Problem List: Decreased strength;Decreased range of motion;Decreased activity tolerance;Decreased coordination;Pain;Decreased cognition;Decreased safety awareness;Impaired UE functional use;Impaired balance (sitting and/or standing)   OT Treatment/Interventions: Self-care/ADL training;Patient/family education;Therapeutic activities;Balance training;Energy conservation    OT Goals(Current goals can be found in the care plan section) Acute Rehab OT Goals Patient Stated Goal: "to find a comfortable position for my back" OT Goal Formulation: With patient Time For Goal Achievement: 01/03/16 Potential to Achieve Goals: Good ADL Goals Pt Will Perform Lower Body Dressing: with min guard assist;sit to/from stand (using FWW and no LOB) Pt Will Transfer to Toilet: with min guard assist;stand pivot transfer;regular height toilet (with FWW and no LOB) Pt/caregiver will Perform Home Exercise Program: Both right and left upper extremity;With theraband;With theraputty;With written HEP  provided  OT Frequency: Min 1X/week   Barriers to D/C:            Co-evaluation              End of Session Equipment Utilized During Treatment: Gait belt Nurse Communication: Patient requests pain meds (pain meds received during eval)  Activity Tolerance: Patient limited by fatigue Patient left: in bed;with call bell/phone within reach;with bed alarm set   Time: 1415-1450 OT Time Calculation (min): 35 min Charges:  OT General Charges $OT Visit: 1 Procedure OT Evaluation $OT Eval Moderate Complexity: 1 Procedure OT Treatments $Self Care/Home Management : 23-37 mins G-Codes:    Susanne Borders, OTR/L ascom [phone] 12/20/15, 3:15 PM

## 2015-12-20 NOTE — Progress Notes (Signed)
[facility] Physicians - [place] [facility]   PATIENT NAME: [name]    MR#:  [id number]  DATE OF BIRTH:  [birth year]  SUBJECTIVE:  CHIEF COMPLAINT:   Chief Complaint  Patient presents with  . Weakness  . Hyperglycemia    REVIEW OF SYSTEMS:   ROS CONSTITUTIONAL: No fever, fatigue or weakness.  EYES: No blurred or double vision.  EARS, NOSE, AND THROAT: No tinnitus or ear pain.  RESPIRATORY: No cough, shortness of breath, wheezing or hemoptysis.  CARDIOVASCULAR: No chest pain, orthopnea, edema.  GASTROINTESTINAL: No nausea, vomiting, diarrhea or abdominal pain.  GENITOURINARY: No dysuria, hematuria.  ENDOCRINE: No polyuria, nocturia,  HEMATOLOGY: No anemia, easy bruising or bleeding SKIN: No rash or lesion. MUSCULOSKELETAL: No joint pain or arthritis.   NEUROLOGIC: No tingling, numbness, weakness.  PSYCHIATRY: No anxiety or depression.   DRUG ALLERGIES:   Allergies  Allergen Reactions  . Lyrica [Pregabalin] Other (See Comments)    hallucination  . Prozac [Fluoxetine Hcl] Other (See Comments)    hallucinations  . Vicodin [Hydrocodone-Acetaminophen] Rash    VITALS:  Blood pressure (!) 152/76, pulse 82, temperature 97.7 F (36.5 C), temperature source Oral, resp. rate 18, height 5\' 3"  (1.6 m), weight 72.6 kg (160 lb), SpO2 95 %.  PHYSICAL EXAMINATION:  GENERAL:  55 y.o.-year-old patient lying in the bed with no acute distress.  EYES: Pupils equal, round, reactive to light and accommodation. No scleral icterus. Extraocular muscles intact.  HEENT: Head atraumatic, normocephalic. Oropharynx and nasopharynx clear.  NECK:  Supple, no jugular venous distention. No thyroid enlargement, no tenderness.  LUNGS: Normal breath sounds bilaterally, no wheezing, rales,rhonchi or crepitation. No use of accessory muscles of respiration.  CARDIOVASCULAR: S1, S2 normal. No murmurs, rubs, or gallops.  ABDOMEN: Soft, nontender, nondistended. Bowel sounds present. No  organomegaly or mass.  EXTREMITIES: No pedal edema, cyanosis, or clubbing.  NEUROLOGIC: Cranial nerves II through XII are intact. Muscle strength 5/5 in all extremities. Sensation intact. Gait not checked.  PSYCHIATRIC: The patient is alert and oriented x 3.  SKIN: No obvious rash, lesion, or ulcer.    LABORATORY PANEL:   CBC  Recent Labs Lab 12/19/15 1457  WBC 7.2  HGB 12.3  HCT 37.7  PLT 189   ------------------------------------------------------------------------------------------------------------------  Chemistries   Recent Labs Lab 12/19/15 1457  NA 139  K 4.5  CL 101  CO2 30  GLUCOSE 412*  BUN 27*  CREATININE 1.12*  CALCIUM 8.7*  AST 24  ALT 13*  ALKPHOS 121  BILITOT 0.4   ------------------------------------------------------------------------------------------------------------------  Cardiac Enzymes  Recent Labs Lab 12/19/15 1457  TROPONINI <0.03   ------------------------------------------------------------------------------------------------------------------  RADIOLOGY:  Dg Lumbar Spine 2-3 Views  Result Date: 12/19/2015 CLINICAL DATA:  Lower extremity weakness since a CVA 1 month ago. History of prior lumbar surgery. EXAM: LUMBAR SPINE - 2-3 VIEW COMPARISON:  Plain films lumbar spine 02/27/2014. FINDINGS: No acute abnormality is identified. The patient is status post L4-5 laminectomy and fusion. Loss of disc space height is again seen at L3-4. 0.6 cm anterolisthesis L3 on L4 due to facet degenerative disease appears worse than on the prior exam. Facet arthropathy at L5-S1 is unchanged. Atherosclerotic vascular disease identified. IMPRESSION: No acute abnormality. Some progression of spondylosis at L3-4. Status post L4-5 fusion. Atherosclerosis. Electronically Signed   By: Drusilla Kanner M.D.   On: 12/19/2015 16:21   Ct Head Wo Contrast  Result Date: 12/19/2015 CLINICAL DATA:  Weakness.  CVA 1 month ago.  Hyperglycemia. EXAM: CT  HEAD WITHOUT  CONTRAST TECHNIQUE: Contiguous axial images were obtained from the base of the skull through the vertex without intravenous contrast. COMPARISON:  05/21/2013 FINDINGS: Brain: 3 by 4 mm lacunar infarct in the posterior limb of the left internal capsule, image 12/2, better appreciated than on the prior exam. Otherwise, the brainstem, cerebellum, cerebral peduncles, thalami, basal ganglia, basilar cisterns, and ventricular system appear within normal limits. No intracranial hemorrhage, mass lesion, or acute CVA. Vascular: Atherosclerotic calcification of the vertebral arteries. Skull: Unremarkable Sinuses/Orbits: Unremarkable Other: No supplemental non-categorized findings. IMPRESSION: 1. 3 by 4 mm hypodense lesion favoring remote lacunar infarct in the posterior limb of the left external capsule. 2. Vertebral artery atherosclerosis. Electronically Signed   By: Gaylyn Rong M.D.   On: 12/19/2015 15:48   Mr Angiogram Neck W Contrast  Result Date: 12/20/2015 CLINICAL DATA:  Falls.  Acute right external capsule infarct on MRI. EXAM: MRA NECK WITHOUT AND WITH CONTRAST MRA HEAD WITHOUT CONTRAST TECHNIQUE: Multiplanar and multiecho pulse sequences of the neck were obtained without and with intravenous contrast. Angiographic images of the neck were obtained using MRA technique without and with intravenous contast.; Angiographic images of the Circle of Willis were obtained using MRA technique without intravenous contrast. CONTRAST:  15mL MULTIHANCE GADOBENATE DIMEGLUMINE 529 MG/ML IV SOLN COMPARISON:  Chest CT 09/07/2013. FINDINGS: MRA NECK FINDINGS Three vessel aortic arch. There is an apparent severe stenosis of the proximal right subclavian artery near its origin, however the vessel was widely patent on a 2015 chest CT and the current appearance could be artifactual. The cervical carotid arteries are widely patent without evidence of stenosis or dissection. The vertebral arteries are patent and codominant with  antegrade flow bilaterally. The vertebral artery origins are not well evaluated bilaterally due to mild motion artifact, and underlying stenosis is not excluded. There is no evidence of significant vertebral artery stenosis elsewhere. MRA HEAD FINDINGS The the visualized distal vertebral arteries are widely patent to the basilar, tortuous, and codominant. Right PICA origin is patent. Left PICA is not clearly identified. AICA and SCA origins are patent. Basilar artery is widely patent. PCAs are patent without evidence of significant stenosis. The internal carotid arteries are patent from skullbase to carotid termini without evidence of significant stenosis allowing for mild motion artifact in the supraclinoid segments. ACAs and MCAs are patent without evidence of major branch occlusion or significant proximal stenosis. The right A1 segment is mildly dominant. No intracranial aneurysm is identified. IMPRESSION: 1. No evidence of major intracranial branch occlusion or significant stenosis. 2. Widely patent cervical carotid arteries. 3. Possible severe proximal right subclavian artery stenosis. However, a normal appearance on a 2015 chest CT and the lack of significant stenosis elsewhere may indicate that this appearance is at least partly artifactual in nature. Consider neck CTA for further assessment of the right subclavian artery as well as the vertebral artery origins which were not well evaluated on this study. Electronically Signed   By: Sebastian Ache M.D.   On: 12/20/2015 11:25   Mr Brain Wo Contrast  Result Date: 12/19/2015 CLINICAL DATA:  Weakness after stroke 1 month ago. Diffuse pain. Ataxia. History of hypertension and diabetes. EXAM: MRI HEAD WITHOUT CONTRAST TECHNIQUE: Multiplanar, multiecho pulse sequences of the brain and surrounding structures were obtained without intravenous contrast. COMPARISON:  CT HEAD December 19, 2015 at 1534 hours FINDINGS: BRAIN: 5 mm rounded reduced diffusion RIGHT  external capsule/subinsula. No susceptibility artifact to suggest hemorrhage. The ventricles and sulci are normal for patient's  age. Old LEFT posterior limb of the internal capsule lacunar infarct with T2 shine through. Scattered subcentimeter supratentorial white matter T2 hyperintensities. No suspicious parenchymal signal, masses or mass effect. No abnormal extra-axial fluid collections. No extra-axial masses though, contrast enhanced sequences would be more sensitive. VASCULAR: Normal major intracranial vascular flow voids present at skull base. SKULL AND UPPER CERVICAL SPINE: No abnormal sellar expansion. No suspicious calvarial bone marrow signal. Craniocervical junction maintained. Small amount of presumed pannus at the odontoid process mildly effaces the ventral thecal sac. SINUSES/ORBITS: Trace maxillary sinus mucosal thickening without paranasal sinus air-fluid levels. The included ocular globes and orbital contents are non-suspicious. OTHER: None. Other: Patient is edentulous. 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 Electronically Signed   By: Awilda Metroourtnay  Bloomer M.D.   On: 12/19/2015 20:08   Mr Maxine GlennMra Head/brain ZOWo Cm  Result Date: 12/20/2015 CLINICAL DATA:  Falls.  Acute right external capsule infarct on MRI. EXAM: MRA NECK WITHOUT AND WITH CONTRAST MRA HEAD WITHOUT CONTRAST TECHNIQUE: Multiplanar and multiecho pulse sequences of the neck were obtained without and with intravenous contrast. Angiographic images of the neck were obtained using MRA technique without and with intravenous contast.; Angiographic images of the Circle of Willis were obtained using MRA technique without intravenous contrast. CONTRAST:  15mL MULTIHANCE GADOBENATE DIMEGLUMINE 529 MG/ML IV SOLN COMPARISON:  Chest CT 09/07/2013. FINDINGS: MRA NECK FINDINGS Three vessel aortic arch. There is an apparent severe stenosis of the  proximal right subclavian artery near its origin, however the vessel was widely patent on a 2015 chest CT and the current appearance could be artifactual. The cervical carotid arteries are widely patent without evidence of stenosis or dissection. The vertebral arteries are patent and codominant with antegrade flow bilaterally. The vertebral artery origins are not well evaluated bilaterally due to mild motion artifact, and underlying stenosis is not excluded. There is no evidence of significant vertebral artery stenosis elsewhere. MRA HEAD FINDINGS The the visualized distal vertebral arteries are widely patent to the basilar, tortuous, and codominant. Right PICA origin is patent. Left PICA is not clearly identified. AICA and SCA origins are patent. Basilar artery is widely patent. PCAs are patent without evidence of significant stenosis. The internal carotid arteries are patent from skullbase to carotid termini without evidence of significant stenosis allowing for mild motion artifact in the supraclinoid segments. ACAs and MCAs are patent without evidence of major branch occlusion or significant proximal stenosis. The right A1 segment is mildly dominant. No intracranial aneurysm is identified. IMPRESSION: 1. No evidence of major intracranial branch occlusion or significant stenosis. 2. Widely patent cervical carotid arteries. 3. Possible severe proximal right subclavian artery stenosis. However, a normal appearance on a 2015 chest CT and the lack of significant stenosis elsewhere may indicate that this appearance is at least partly artifactual in nature. Consider neck CTA for further assessment of the right subclavian artery as well as the vertebral artery origins which were not well evaluated on this study. Electronically Signed   By: Sebastian AcheAllen  Grady M.D.   On: 12/20/2015 11:25    EKG:   Orders placed or performed during the hospital encounter of 07/17/15  . ED EKG  . ED EKG  . EKG 12-Lead  . EKG 12-Lead     ASSESSMENT AND PLAN:   #1. 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   n  2.Lethargy; d/c elavil,decrease neurontin dose Multiple falls; physical therapy consult Essential hypertension: On lisinopril History of COPD: Continue Dulera. ,spiriva  All the records are reviewed and case discussed with Care Management/Social Workerr. Management plans discussed with the Acute right internal capsular infarct: C CODE STATUS:full  TOTAL TIME TAKING CARE OF THIS PATIENT:35  minutes.   POSSIBLE D/C IN 1-2 DAYS, DEPENDING ON CLINICAL CONDITION.   Katha HammingKONIDENA,Kermit Arnette M.D on 12/20/2015 at 2:32 PM  Between 7am to 6pm - Pager - [phone]  After 6pm go to [email or web address] - password [email or web address] [facility]  [facility]  Office  [phone]  CC: Primary care physician; Nancy NordmannALVIN C POWELL JR, MD   Note: This dictation was prepared with Dragon dictation along with smaller phrase technology. Any transcriptional errors that result from this process are unintentional.

## 2015-12-20 NOTE — Care Management (Signed)
Admitted to [facility] with the diagnosis of stroke. Daughter, [name], lives with her 5-6 years. [phone]). Night oxygen 3 liters per nasal cannula x  5 years. Oxygen provided by [facility]. Rolling walker, bedside commode, and wheelchair in the home. Takes care of all basic activities of daily living herself, doesn't drive. [facility]. No skilled nursing. Decreased appetite. Fell yesterday. Last seen Dr. Lowell G[place]Powell in [place] 08/01/15. Daughter will transport. Gwenette GreetBrenda s Rosalin Buster RN MSN CCM Care Management [phone]

## 2015-12-20 NOTE — Progress Notes (Addendum)
Inpatient Diabetes Program Recommendations  AACE/ADA: New Consensus Statement on Inpatient Glycemic Control (2015)  Target Ranges:  Prepandial:   less than 140 mg/dL      Peak postprandial:   less than 180 mg/dL (1-2 hours)      Critically ill patients:  140 - 180 mg/dL   Results for [name], Arena W (MRN [id number]) as of 12/20/2015 12:38  Ref. Range 12/19/2015 16:20 12/19/2015 17:47 12/19/2015 23:14 12/20/2015 07:32 12/20/2015 12:22  Glucose-Capillary Latest Ref Range: 65 - 99 mg/dL 409329 (H) 811198 (H) 914363 (H) 174 (H) 192 (H)   Results for [name], Clydie W (MRN [id number]) as of 12/20/2015 12:38  Ref. Range 07/17/2015 21:28  Hemoglobin A1C Latest Ref Range: 4.0 - 6.0 % 10.8 (H)   A1C in process for this admission, will follow.  Review of Glycemic Control  Diabetes history: DM2 Outpatient Diabetes medications: Lantus 72 units QHS, Novolog 10 units TIDAC   Note: Spoke with patient and confirmed taking these doses.  Patient able to teach back signs of hypoglycemia and hyperglycemia and states she has not had hypoglycemic episodes on these doses.  Patient states she only checks CBG's one time per day - until she started falling when she stopped checking her CBG's.  This RN reinforced checking CBG's especially when experiencing a change in condition.  Current orders for Inpatient glycemic control: Novolog sensitive correction 0-9 units TIDAC and 0-5 units QHS, carb mod diet  Inpatient Diabetes Program Recommendations: Please consider:  Lantus 10 units QHS  Novolog 3 units meal coverage TIDAC if patient eats > 50% of meal  Thank you,  Kristine LineaKaren Gaylynn Seiple, RN, BSN Diabetes Coordinator Inpatient Diabetes Program [phone] (Team Pager)

## 2015-12-21 LAB — BLOOD GAS, ARTERIAL
Acid-Base Excess: 6.9 mmol/L — ABNORMAL HIGH (ref 0.0–2.0)
Bicarbonate: 33.3 mmol/L — ABNORMAL HIGH (ref 20.0–28.0)
FIO2: 0.28
O2 SAT: 94.9 %
PCO2 ART: 55 mmHg — AB (ref 32.0–48.0)
PH ART: 7.39 (ref 7.350–7.450)
PO2 ART: 76 mmHg — AB (ref 83.0–108.0)
Patient temperature: 37

## 2015-12-21 LAB — HEMOGLOBIN A1C
HEMOGLOBIN A1C: 10.5 % — AB (ref 4.8–5.6)
Mean Plasma Glucose: 255 mg/dL

## 2015-12-21 LAB — GLUCOSE, CAPILLARY
Glucose-Capillary: 237 mg/dL — ABNORMAL HIGH (ref 65–99)
Glucose-Capillary: 357 mg/dL — ABNORMAL HIGH (ref 65–99)

## 2015-12-21 MED ORDER — ASPIRIN 325 MG PO TBEC: 325.0000 mg | DELAYED_RELEASE_TABLET | Freq: Every day | ORAL | 0 refills | Status: AC

## 2015-12-21 MED ORDER — GABAPENTIN 300 MG PO CAPS: 600.0000 mg | ORAL_CAPSULE | Freq: Two times a day (BID) | ORAL | 0 refills | Status: AC

## 2015-12-21 MED ORDER — INSULIN GLARGINE 100 UNITS/ML SOLOSTAR PEN: 50.0000 [IU] | PEN_INJECTOR | Freq: Every day | SUBCUTANEOUS | 11 refills | Status: DC

## 2015-12-21 MED ORDER — INSULIN GLARGINE 100 UNITS/ML SOLOSTAR PEN: 20.0000 [IU] | PEN_INJECTOR | Freq: Every day | SUBCUTANEOUS | 11 refills | Status: DC

## 2015-12-21 MED ORDER — INSULIN ASPART 100 UNIT/ML ~~LOC~~ SOLN: 0.0000 [IU] | Freq: Every day | SUBCUTANEOUS | 11 refills | Status: AC

## 2015-12-21 NOTE — Progress Notes (Addendum)
Inpatient Diabetes Program Recommendations  AACE/ADA: New Consensus Statement on Inpatient Glycemic Control (2015)  Target Ranges:  Prepandial:   less than 140 mg/dL      Peak postprandial:   less than 180 mg/dL (1-2 hours)      Critically ill patients:  140 - 180 mg/dL   Review of Glycemic Control:  Results for [name], Elaf W (MRN [id number]) as of 12/21/2015 10:35  Ref. Range 12/20/2015 12:22 12/20/2015 17:17 12/20/2015 21:24 12/20/2015 21:38 12/21/2015 07:54  Glucose-Capillary Latest Ref Range: 65 - 99 mg/dL 295192 (H) 284327 (H) 132418 (H) 303 (H) 237 (H)   Results for [name], Laxmi W (MRN [id number]) as of 12/21/2015 10:35  Ref. Range 12/20/2015 04:07  Hemoglobin A1C Latest Ref Range: 4.8 - 5.6 % 10.5 (H)   Diabetes history: DM2  Outpatient Diabetes medications: Lantus 72 units QHS, Novolog 10 units TIDAC   Note: Spoke with patient yesterday and confirmed taking these doses.  Patient able to teach back signs of hypoglycemia and hyperglycemia and states she has not had hypoglycemic episodes on these doses.  Patient states she only checks CBG's one time per day - until she started falling when she stopped checking her CBG's.  This RN reinforced checking CBG's especially when experiencing a change in condition.  Current orders for Inpatient glycemic control: Novolog sensitive correction 0-9 units TIDAC and 0-5 units QHS, Lantus 10 units QHS, carb mod diet  Inpatient Diabetes Program Recommendations based on A1C of 10.5: Please consider:             Increase Lantus 20 units QHS.             Add Novolog 3 units meal coverage TIDAC if patient eats > 50% of meal.  Thank you,  Kristine LineaKaren Donyell Carrell, RN, BSN Diabetes Coordinator Inpatient Diabetes Program [phone] (Team Pager)

## 2015-12-21 NOTE — Progress Notes (Signed)
LCSW met with patient after discussing care with PT and recommendations for SNF. Patient reports she lives at home with her daughter in one level house. Reports at this time she wants to go back home and follow up either outpatient or with [facility] PT. Patient made aware she could go to SNF, but it would be under her medicaid and her check would be turned over to SNF and she would have to stay for 30 days. Patient politely declined at this time.   LCSW updated PT and CM regarding plan. No other needs at this time. Will DC to home when medically stable.  Lane Hacker, MSW Clinical Social Work: [employer] Coverage for :  [phone]

## 2015-12-21 NOTE — Progress Notes (Signed)
Physical Therapy Treatment Patient Details Name: Teresa BastDeborah W Marich MRN: [id number] DOB: 1960/09/03 Today's Date: 12/21/2015    History of Present Illness Pt is a 55 y.o. female w/ PMH of anxiety/depression, recurrent major depression, grief at loss of child recently, HTN, COPD, DM who presents with some persistent weakness and ataxia over the past month. Patient states that she had a stroke and records show that she did have a cerebrovascular accident approximately 3 months ago. She states over the past month she's had increasing difficulty ambulating at home without headache with several falls. She admits to being noncompliant with her blood sugar and also her home oxygen therapy stating she uses it only at night. She still continues to smoke. Pt is edentulous which impacts articulation of speech with slightly mumbled speech with drowsiness most likely from meds. 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  MRI indicated a new CVA in R external capsule.    PT Comments    Pt able to progress ambulation distance to 60 feet with RW but distance limited d/t increasing trunk and B knee flexion noted with distance.  Pt appearing motivated to participate in PT but also demonstrates decreased safety awareness during session.  Will continue to progress pt with strengthening, balance, and increasing independence with functional mobility per pt tolerance.   Follow Up Recommendations  SNF     Equipment Recommendations   (pt already owns RW)    Recommendations for Other Services       Precautions / Restrictions Precautions Precautions: Fall Precaution Comments: Aspiration Restrictions Weight Bearing Restrictions: No    Mobility  Bed Mobility Overal bed mobility: Needs Assistance Bed Mobility: Supine to Sit     Supine to sit: Supervision;HOB elevated      General bed mobility comments: SBA for safety; use of bed rail  Transfers Overall transfer level: Needs assistance Equipment used: Rolling walker (2 wheeled) Transfers: Sit to/from Stand Sit to Stand: Min guard;Min assist         General transfer comment: pt with difficulty coming to full upright posture d/t chronic low back pain; posterior lean upon standing requiring min assist to shift weight forward  Ambulation/Gait Ambulation/Gait assistance: Min guard;Min assist Ambulation Distance (Feet): 60 Feet Assistive device: Rolling walker (2 wheeled)   Gait velocity: decreased   General Gait Details: decreased B step length/foot clearance/heelstrike; flexed posture leaning onto RW; limited distance d/t B knees and trunk with increased flexion with distance   Stairs            Wheelchair Mobility    Modified Rankin (Stroke Patients Only)       Balance Overall balance assessment: History of Falls;Needs assistance Sitting-balance support: Bilateral upper extremity supported;Feet supported Sitting balance-Leahy Scale: Good Sitting balance - Comments: static sitting   Standing balance support: Bilateral upper extremity supported (on RW) Standing balance-Leahy Scale: Fair Standing balance comment: static standing                    Cognition Arousal/Alertness: Awake/alert Behavior During Therapy: Flat affect;Impulsive Overall Cognitive Status: Within Functional Limits for tasks assessed       Memory: Decreased short-term memory (Decreased safety awareness)              Exercises General Exercises - Lower Extremity Hip ABduction/ADduction: AROM;Strengthening;Both;10 reps;Standing Hip Flexion/Marching:  AROM;Strengthening;Both;10 reps;Standing Heel Raises: AROM;Strengthening;Both;10 reps;Standing Other Exercises Other Exercises: standing hamstring curls AROM B LE     General Comments General comments (skin integrity, edema, etc.): Pt eager to go for  a walk and agreeable to PT.  Nursing cleared pt for participation in physical therapy (nursing reports plan to give pt pain meds after PT session).       Pertinent Vitals/Pain Pain Assessment: 0-10 Pain Score: 8  Pain Location: chronic LBP Pain Descriptors / Indicators: Aching;Constant Pain Intervention(s): Limited activity within patient's tolerance;Monitored during session;Repositioned (RN aware pt wanting pain meds)  Vitals (HR and O2) stable and WFL throughout treatment session.     Home Living                      Prior Function            PT Goals (current goals can now be found in the care plan section) Acute Rehab PT Goals Patient Stated Goal: to be able to walk more PT Goal Formulation: With patient Time For Goal Achievement: 01/03/16 Potential to Achieve Goals: Good Progress towards PT goals: Progressing toward goals    Frequency    7X/week      PT Plan Current plan remains appropriate    Co-evaluation             End of Session Equipment Utilized During Treatment: Gait belt Activity Tolerance: Patient limited by fatigue Patient left: in chair;with call bell/phone within reach;with chair alarm set     Time: 1007-1030 PT Time Calculation (min) (ACUTE ONLY): 23 min  Charges:  $Therapeutic Exercise: 8-22 mins $Therapeutic Activity: 8-22 mins                    G CodesHendricks Limes:      Donielle Kaigler 12/21/2015, 10:40 AM Hendricks LimesEmily Isiaah Cuervo, PT [phone]

## 2015-12-21 NOTE — Progress Notes (Signed)
Patient d/ced home.  PT recommends rehab but Medicaid won't cover.  Patient's PCP will need to write script for outpatient PT.  Patient diagnosed with new stroke.  NIHSS = 2.  Patient has generalized weakness but extremities move well.  Had some dysarthria today.  Couldn't find words, and had trouble with some of the phrases. Patient is very depressed.  Son died in June or July.  She also has chronic back/hip pain.  IVs removed, and d/c instructions reviewed particularly stroke education performed.

## 2015-12-21 NOTE — Progress Notes (Signed)
Subjective: Patient awake and alert.    Objective: Current vital signs: BP (!) 143/95   Pulse 78   Temp 98 F (36.7 C) (Oral)   Resp 18   Ht 5\' 3"  (1.6 m)   Wt 72.6 kg (160 lb)   SpO2 96%   BMI 28.34 kg/m  Vital signs in last 24 hours: Temp:  [97.5 F (36.4 C)-98.6 F (37 C)] 98 F (36.7 C) (11/17 0849) Pulse Rate:  [73-88] 78 (11/17 0849) Resp:  [18-20] 18 (11/17 0849) BP: (143-152)/(60-95) 143/95 (11/17 0849) SpO2:  [92 %-97 %] 96 % (11/17 0849)  Intake/Output from previous day: 11/16 0701 - 11/17 0700 In: 720 [P.O.:720] Out: -  Intake/Output this shift: Total I/O In: 120 [P.O.:120] Out: -  Nutritional status: Diet heart healthy/carb modified Room service appropriate? Yes with Assist; Fluid consistency: Thin  Neurologic Exam: Mental Status: Alert, oriented, thought content appropriate.  Speech fluent without evidence of aphasia.  Able to follow 3 step commands without difficulty. Cranial Nerves: II: Discs flat bilaterally; Visual fields grossly normal, pupils equal, round, reactive to light and accommodation III,IV, VI: ptosis not present, extra-ocular motions intact bilaterally V,VII: smile symmetric, facial light touch sensation normal bilaterally VIII: hearing normal bilaterally IX,X: gag reflex present XI: bilateral shoulder shrug XII: midline tongue extension Motor: Lifts all extremities against gravity although weakly with no focal weakness noted   Lab Results: Basic Metabolic Panel:  Recent Labs Lab 12/19/15 1457  NA 139  K 4.5  CL 101  CO2 30  GLUCOSE 412*  BUN 27*  CREATININE 1.12*  CALCIUM 8.7*    Liver Function Tests:  Recent Labs Lab 12/19/15 1457  AST 24  ALT 13*  ALKPHOS 121  BILITOT 0.4  PROT 7.4  ALBUMIN 3.3*   No results for input(s): LIPASE, AMYLASE in the last 168 hours. No results for input(s): AMMONIA in the last 168 hours.  CBC:  Recent Labs Lab 12/19/15 1457  WBC 7.2  NEUTROABS 5.1  HGB 12.3  HCT 37.7   MCV 88.1  PLT 189    Cardiac Enzymes:  Recent Labs Lab 12/19/15 1457  TROPONINI <0.03    Lipid Panel:  Recent Labs Lab 12/20/15 0407  CHOL 124  TRIG 256*  HDL 33*  CHOLHDL 3.8  VLDL 51*  LDLCALC 40    CBG:  Recent Labs Lab 12/20/15 1222 12/20/15 1717 12/20/15 2124 12/20/15 2138 12/21/15 0754  GLUCAP 192* 327* 418* 303* 237*    Microbiology: Results for orders placed or performed during the hospital encounter of 07/17/15  Urine culture     Status: Abnormal   Collection Time: 07/17/15  4:16 PM  Result Value Ref Range Status   Specimen Description URINE, CLEAN CATCH  Final   Special Requests NONE  Final   Culture >=100,000 COLONIES/mL ESCHERICHIA COLI (A)  Final   Report Status 07/20/2015 FINAL  Final   Organism ID, Bacteria ESCHERICHIA COLI (A)  Final      Susceptibility   Escherichia coli - MIC*    AMPICILLIN 4 SENSITIVE Sensitive     CEFAZOLIN <=4 SENSITIVE Sensitive     CEFTRIAXONE <=1 SENSITIVE Sensitive     CIPROFLOXACIN <=0.25 SENSITIVE Sensitive     GENTAMICIN <=1 SENSITIVE Sensitive     IMIPENEM <=0.25 SENSITIVE Sensitive     NITROFURANTOIN <=16 SENSITIVE Sensitive     TRIMETH/SULFA <=20 SENSITIVE Sensitive     AMPICILLIN/SULBACTAM 4 SENSITIVE Sensitive     PIP/TAZO <=4 SENSITIVE Sensitive     Extended  ESBL NEGATIVE Sensitive     * >=100,000 COLONIES/mL ESCHERICHIA COLI  MRSA PCR Screening     Status: None   Collection Time: 07/18/15  7:44 AM  Result Value Ref Range Status   MRSA by PCR NEGATIVE NEGATIVE Final    Comment:        The GeneXpert MRSA Assay (FDA approved for NASAL specimens only), is one component of a comprehensive MRSA colonization surveillance program. It is not intended to diagnose MRSA infection nor to guide or monitor treatment for MRSA infections.   Culture, respiratory (NON-Expectorated)     Status: None   Collection Time: 07/18/15  9:25 AM  Result Value Ref Range Status   Specimen Description TRACHEAL  ASPIRATE  Final   Special Requests Normal  Final   Gram Stain   Final    ABUNDANT WBC PRESENT,BOTH PMN AND MONONUCLEAR NO ORGANISMS SEEN    Culture   Final    Consistent with normal respiratory flora. [facility]    Report Status 07/20/2015 FINAL  Final    Coagulation Studies: No results for input(s): LABPROT, INR in the last 72 hours.  Imaging: Dg Lumbar Spine 2-3 Views  Result Date: 12/19/2015 CLINICAL DATA:  Lower extremity weakness since a CVA 1 month ago. History of prior lumbar surgery. EXAM: LUMBAR SPINE - 2-3 VIEW COMPARISON:  Plain films lumbar spine 02/27/2014. FINDINGS: No acute abnormality is identified. The patient is status post L4-5 laminectomy and fusion. Loss of disc space height is again seen at L3-4. 0.6 cm anterolisthesis L3 on L4 due to facet degenerative disease appears worse than on the prior exam. Facet arthropathy at L5-S1 is unchanged. Atherosclerotic vascular disease identified. IMPRESSION: No acute abnormality. Some progression of spondylosis at L3-4. Status post L4-5 fusion. Atherosclerosis. Electronically Signed   By: Drusilla Kannerhomas  Dalessio M.D.   On: 12/19/2015 16:21   Ct Head Wo Contrast  Result Date: 12/19/2015 CLINICAL DATA:  Weakness.  CVA 1 month ago.  Hyperglycemia. EXAM: CT HEAD WITHOUT CONTRAST TECHNIQUE: Contiguous axial images were obtained from the base of the skull through the vertex without intravenous contrast. COMPARISON:  05/21/2013 FINDINGS: Brain: 3 by 4 mm lacunar infarct in the posterior limb of the left internal capsule, image 12/2, better appreciated than on the prior exam. Otherwise, the brainstem, cerebellum, cerebral peduncles, thalami, basal ganglia, basilar cisterns, and ventricular system appear within normal limits. No intracranial hemorrhage, mass lesion, or acute CVA. Vascular: Atherosclerotic calcification of the vertebral arteries. Skull: Unremarkable Sinuses/Orbits: Unremarkable Other: No supplemental  non-categorized findings. IMPRESSION: 1. 3 by 4 mm hypodense lesion favoring remote lacunar infarct in the posterior limb of the left external capsule. 2. Vertebral artery atherosclerosis. Electronically Signed   By: Gaylyn RongWalter  Liebkemann M.D.   On: 12/19/2015 15:48   Mr Angiogram Neck W Contrast  Result Date: 12/20/2015 CLINICAL DATA:  Falls.  Acute right external capsule infarct on MRI. EXAM: MRA NECK WITHOUT AND WITH CONTRAST MRA HEAD WITHOUT CONTRAST TECHNIQUE: Multiplanar and multiecho pulse sequences of the neck were obtained without and with intravenous contrast. Angiographic images of the neck were obtained using MRA technique without and with intravenous contast.; Angiographic images of the Circle of Willis were obtained using MRA technique without intravenous contrast. CONTRAST:  15mL MULTIHANCE GADOBENATE DIMEGLUMINE 529 MG/ML IV SOLN COMPARISON:  Chest CT 09/07/2013. FINDINGS: MRA NECK FINDINGS Three vessel aortic arch. There is an apparent severe stenosis of the proximal right subclavian artery near its origin, however the vessel was widely patent on  a 2015 chest CT and the current appearance could be artifactual. The cervical carotid arteries are widely patent without evidence of stenosis or dissection. The vertebral arteries are patent and codominant with antegrade flow bilaterally. The vertebral artery origins are not well evaluated bilaterally due to mild motion artifact, and underlying stenosis is not excluded. There is no evidence of significant vertebral artery stenosis elsewhere. MRA HEAD FINDINGS The the visualized distal vertebral arteries are widely patent to the basilar, tortuous, and codominant. Right PICA origin is patent. Left PICA is not clearly identified. AICA and SCA origins are patent. Basilar artery is widely patent. PCAs are patent without evidence of significant stenosis. The internal carotid arteries are patent from skullbase to carotid termini without evidence of significant  stenosis allowing for mild motion artifact in the supraclinoid segments. ACAs and MCAs are patent without evidence of major branch occlusion or significant proximal stenosis. The right A1 segment is mildly dominant. No intracranial aneurysm is identified. IMPRESSION: 1. No evidence of major intracranial branch occlusion or significant stenosis. 2. Widely patent cervical carotid arteries. 3. Possible severe proximal right subclavian artery stenosis. However, a normal appearance on a 2015 chest CT and the lack of significant stenosis elsewhere may indicate that this appearance is at least partly artifactual in nature. Consider neck CTA for further assessment of the right subclavian artery as well as the vertebral artery origins which were not well evaluated on this study. Electronically Signed   By: Sebastian Ache M.D.   On: 12/20/2015 11:25   Mr Brain Wo Contrast  Result Date: 12/19/2015 CLINICAL DATA:  Weakness after stroke 1 month ago. Diffuse pain. Ataxia. History of hypertension and diabetes. EXAM: MRI HEAD WITHOUT CONTRAST TECHNIQUE: Multiplanar, multiecho pulse sequences of the brain and surrounding structures were obtained without intravenous contrast. COMPARISON:  CT HEAD December 19, 2015 at 1534 hours FINDINGS: BRAIN: 5 mm rounded reduced diffusion RIGHT external capsule/subinsula. No susceptibility artifact to suggest hemorrhage. The ventricles and sulci are normal for patient's age. Old LEFT posterior limb of the internal capsule lacunar infarct with T2 shine through. Scattered subcentimeter supratentorial white matter T2 hyperintensities. No suspicious parenchymal signal, masses or mass effect. No abnormal extra-axial fluid collections. No extra-axial masses though, contrast enhanced sequences would be more sensitive. VASCULAR: Normal major intracranial vascular flow voids present at skull base. SKULL AND UPPER CERVICAL SPINE: No abnormal sellar expansion. No suspicious calvarial bone marrow signal.  Craniocervical junction maintained. Small amount of presumed pannus at the odontoid process mildly effaces the ventral thecal sac. SINUSES/ORBITS: Trace maxillary sinus mucosal thickening without paranasal sinus air-fluid levels. The included ocular globes and orbital contents are non-suspicious. OTHER: None. Other: Patient is edentulous. 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 Electronically Signed   By: Awilda Metro M.D.   On: 12/19/2015 20:08   Mr Maxine Glenn Head/brain OZ Cm  Result Date: 12/20/2015 CLINICAL DATA:  Falls.  Acute right external capsule infarct on MRI. EXAM: MRA NECK WITHOUT AND WITH CONTRAST MRA HEAD WITHOUT CONTRAST TECHNIQUE: Multiplanar and multiecho pulse sequences of the neck were obtained without and with intravenous contrast. Angiographic images of the neck were obtained using MRA technique without and with intravenous contast.; Angiographic images of the Circle of Willis were obtained using MRA technique without intravenous contrast. CONTRAST:  15mL MULTIHANCE GADOBENATE DIMEGLUMINE 529 MG/ML IV SOLN COMPARISON:  Chest CT 09/07/2013. FINDINGS: MRA NECK FINDINGS Three vessel aortic arch. There is an apparent  severe stenosis of the proximal right subclavian artery near its origin, however the vessel was widely patent on a 2015 chest CT and the current appearance could be artifactual. The cervical carotid arteries are widely patent without evidence of stenosis or dissection. The vertebral arteries are patent and codominant with antegrade flow bilaterally. The vertebral artery origins are not well evaluated bilaterally due to mild motion artifact, and underlying stenosis is not excluded. There is no evidence of significant vertebral artery stenosis elsewhere. MRA HEAD FINDINGS The the visualized distal vertebral arteries are widely patent to the basilar, tortuous, and codominant.  Right PICA origin is patent. Left PICA is not clearly identified. AICA and SCA origins are patent. Basilar artery is widely patent. PCAs are patent without evidence of significant stenosis. The internal carotid arteries are patent from skullbase to carotid termini without evidence of significant stenosis allowing for mild motion artifact in the supraclinoid segments. ACAs and MCAs are patent without evidence of major branch occlusion or significant proximal stenosis. The right A1 segment is mildly dominant. No intracranial aneurysm is identified. IMPRESSION: 1. No evidence of major intracranial branch occlusion or significant stenosis. 2. Widely patent cervical carotid arteries. 3. Possible severe proximal right subclavian artery stenosis. However, a normal appearance on a 2015 chest CT and the lack of significant stenosis elsewhere may indicate that this appearance is at least partly artifactual in nature. Consider neck CTA for further assessment of the right subclavian artery as well as the vertebral artery origins which were not well evaluated on this study. Electronically Signed   By: Sebastian Ache M.D.   On: 12/20/2015 11:25    Medications:  I have reviewed the patient's current medications. Scheduled: . aspirin EC  325 mg Oral Daily  . atorvastatin  40 mg Oral QHS  . enoxaparin (LOVENOX) injection  40 mg Subcutaneous Q24H  . gabapentin  600 mg Oral BID  . insulin aspart  0-5 Units Subcutaneous QHS  . insulin aspart  0-9 Units Subcutaneous TID WC  . insulin glargine  10 Units Subcutaneous QHS  . lisinopril  2.5 mg Oral Daily  . mometasone-formoterol  2 puff Inhalation BID  . tiotropium  18 mcg Inhalation Daily    Assessment/Plan: Patient alert today.  No complaints of pain.    Recommendations: 1.  Would continue Neurontin at lower dose and off Elavil 2.  Continue ASA   LOS: 2 days   Thana Farr, MD Neurology [phone] 12/21/2015  11:21 AM

## 2015-12-21 NOTE — Care Management (Signed)
Discussed home health agencies. States she wants to go home with outpatient therapy services. Discussed that Dr. Lowell GuitarPowell , her primary care physician, would need to order outpatient services. States she would make a follow-up with Dr. Lowell GuitarPowell. Gwenette GreetBrenda S Contrina Orona RN MSN CCM Case Management

## 2015-12-21 NOTE — Progress Notes (Signed)
While rounding, CH made initial visit to room 108. Pt was sitting in bed and family member was bedside. Pt was in good spirits. Pt was hoping the MD would discharge her today. Pt did not indicate a need for spiritual care at this time.    12/21/15 1400  Clinical Encounter Type  Visited With Patient;Patient and family together  Visit Type Initial;Spiritual support  Referral From Nurse

## 2015-12-21 NOTE — Discharge Summary (Signed)
Teresa BastDeborah W Fields, is a 55 y.o. female  DOB 13-Jul-1960  MRN [id number].  Admission date:  12/19/2015  Admitting Physician  Oralia Manisavid Willis, MD  Discharge Date:  12/21/2015   Primary MD  Nancy NordmannALVIN C POWELL JR, MD  Recommendations for primary care physician for things to follow:  Follow-up with primary doctor in 1 week   Admission Diagnosis  Fall   Discharge Diagnosis  Fall    Principal Problem:   Stroke [facility](HCC) Active Problems:   COPD (chronic obstructive pulmonary disease) (HCC)   Anxiety   Moderate recurrent major depression (HCC)   Diabetes (HCC)      Past Medical History:  Diagnosis Date  . Anxiety   . Asthma   . Coarse tremors   . COPD (chronic obstructive pulmonary disease) (HCC)    on 3L home o2  . 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 Hyperlipidemia   . Hypertension   . Insomnia     Past Surgical History:  Procedure Laterality Date  . BACK SURGERY     x 2 lumbar spine  . BRONCHOSCOPY    . CERVICAL SPINE SURGERY    . CHOLECYSTECTOMY    . FOOT SURGERY         History of present illness and  Hospital Course:     Kindly see H&P for history of present illness and admission details, please review complete Labs, Consult reports and Test reports for all details in brief  HPI  from the history and physical done on the day of admission 55 year old female patient admitted because of frequent falls. Patient noted to have acute right external capsule infarct. And she is admitted for the same.   Hospital Course  #1 acute CVA: Admitted to hospitalist service on telemetry,; patient had persistent falls for several days; started on aspirin, statins, seen by neurology, physical therapy, speech therapy. Laboratory showed a CBC, Chem-7 within normal range, LDL 40. Urine toxicology  positive for opiates, benzodiazepines. MRI of the brain showed acute right external capsular, subinsular infarct. Patient had some left-sided weakness when she came. Patient had previous left internal capsular infarct. Patient had MRA of the head, neck. MRA of the neck did not show any carotid stenosis, MRA of also commented on possible subclavian artery stenosis but the  Neurologist reviewed the radiology reports and radiology films and the thought to have artefact.. PTrecommended skilled nursing facility . She  is stable for discharge today. 2. Lethargy due to medication.eleavil stopped,and decreased dose of neurotnin to HALF.today she is much better, awake, alert, oriented.   3 .back pain: Patient is on pain medications.   for poorly controlled diabetes mellitus type 2: Hemoglobin A1c is 10. Seen by diabetes coordinator. Lantus 20 units daily at bedtime, NovoLog 3 units 3 times a day with meal coverage.     Discharge Condition:STABLE   Follow UP    Discharge Instructions  and  Discharge Medications        Medication List    STOP taking these medications   amitriptyline 25 MG tablet Commonly known as:  ELAVIL     TAKE these medications   albuterol (2.5 MG/3ML) 0.083% nebulizer solution Commonly known as:  PROVENTIL Take 2.5 mg by nebulization every 6 (six) hours as needed for wheezing or shortness of breath.   albuterol-ipratropium 18-103 MCG/ACT inhaler Commonly known as:  COMBIVENT Inhale 1 puff into the lungs every 6 (six) hours as needed for wheezing or shortness of breath.  ALPRAZolam 1 MG tablet Commonly known as:  XANAX Take 1 mg by mouth 4 (four) times daily as needed for anxiety.   aspirin 325 MG EC tablet Take 1 tablet (325 mg total) by mouth daily. Start taking on:  12/22/2015   atorvastatin 40 MG tablet Commonly known as:  LIPITOR Take 40 mg by mouth at bedtime.   fluticasone 50 MCG/ACT nasal spray Commonly known as:  FLONASE Place 2 sprays into both  nostrils daily as needed for rhinitis.   Fluticasone-Salmeterol 250-50 MCG/DOSE Aepb Commonly known as:  ADVAIR Inhale 1 puff into the lungs 2 (two) times daily.   gabapentin 300 MG capsule Commonly known as:  NEURONTIN Take 2 capsules (600 mg total) by mouth 2 (two) times daily. What changed:  when to take this   insulin glargine 100 unit/mL Sopn Commonly known as:  LANTUS Inject 0.2 mLs (20 Units total) into the skin at bedtime. What changed:  how much to take   lisinopril 2.5 MG tablet Commonly known as:  PRINIVIL,ZESTRIL Take 2.5 mg by mouth daily.   magnesium oxide 400 (241.3 Mg) MG tablet Commonly known as:  MAG-OX Take 400 mg by mouth at bedtime.   NOVOLOG FLEXPEN 100 UNIT/ML FlexPen Generic drug:  insulin aspart Inject 10 Units into the skin 3 (three) times daily before meals. What changed:  Another medication with the same name was added. Make sure you understand how and when to take each.   insulin aspart 100 UNIT/ML injection Commonly known as:  novoLOG Inject 0-5 Units into the skin at bedtime. What changed:  You were already taking a medication with the same name, and this prescription was added. Make sure you understand how and when to take each.   oxyCODONE-acetaminophen 7.5-325 MG tablet Commonly known as:  PERCOCET Take 1 tablet by mouth every 4 (four) hours as needed for severe pain. Every 4-6 hours   tiotropium 18 MCG inhalation capsule Commonly known as:  SPIRIVA Place 18 mcg into inhaler and inhale daily.   traZODone 100 MG tablet Commonly known as:  DESYREL Take 200 mg by mouth at bedtime.         Diet and Activity recommendation: See Discharge Instructions above   Consults obtained - diabetes coordinator, neurology    Major procedures and Radiology Reports - PLEASE review detailed and final reports for all details, in brief -     Dg Lumbar Spine 2-3 Views  Result Date: 12/19/2015 CLINICAL DATA:  Lower extremity weakness since a  CVA 1 month ago. History of prior lumbar surgery. EXAM: LUMBAR SPINE - 2-3 VIEW COMPARISON:  Plain films lumbar spine 02/27/2014. FINDINGS: No acute abnormality is identified. The patient is status post L4-5 laminectomy and fusion. Loss of disc space height is again seen at L3-4. 0.6 cm anterolisthesis L3 on L4 due to facet degenerative disease appears worse than on the prior exam. Facet arthropathy at L5-S1 is unchanged. Atherosclerotic vascular disease identified. IMPRESSION: No acute abnormality. Some progression of spondylosis at L3-4. Status post L4-5 fusion. Atherosclerosis. Electronically Signed   By: Drusilla Kanner M.D.   On: 12/19/2015 16:21   Ct Head Wo Contrast  Result Date: 12/19/2015 CLINICAL DATA:  Weakness.  CVA 1 month ago.  Hyperglycemia. EXAM: CT HEAD WITHOUT CONTRAST TECHNIQUE: Contiguous axial images were obtained from the base of the skull through the vertex without intravenous contrast. COMPARISON:  05/21/2013 FINDINGS: Brain: 3 by 4 mm lacunar infarct in the posterior limb of the left internal capsule, image 12/2,  better appreciated than on the prior exam. Otherwise, the brainstem, cerebellum, cerebral peduncles, thalami, basal ganglia, basilar cisterns, and ventricular system appear within normal limits. No intracranial hemorrhage, mass lesion, or acute CVA. Vascular: Atherosclerotic calcification of the vertebral arteries. Skull: Unremarkable Sinuses/Orbits: Unremarkable Other: No supplemental non-categorized findings. IMPRESSION: 1. 3 by 4 mm hypodense lesion favoring remote lacunar infarct in the posterior limb of the left external capsule. 2. Vertebral artery atherosclerosis. Electronically Signed   By: Gaylyn RongWalter  Liebkemann M.D.   On: 12/19/2015 15:48   Mr Angiogram Neck W Contrast  Result Date: 12/20/2015 CLINICAL DATA:  Falls.  Acute right external capsule infarct on MRI. EXAM: MRA NECK WITHOUT AND WITH CONTRAST MRA HEAD WITHOUT CONTRAST TECHNIQUE: Multiplanar and multiecho  pulse sequences of the neck were obtained without and with intravenous contrast. Angiographic images of the neck were obtained using MRA technique without and with intravenous contast.; Angiographic images of the Circle of Willis were obtained using MRA technique without intravenous contrast. CONTRAST:  15mL MULTIHANCE GADOBENATE DIMEGLUMINE 529 MG/ML IV SOLN COMPARISON:  Chest CT 09/07/2013. FINDINGS: MRA NECK FINDINGS Three vessel aortic arch. There is an apparent severe stenosis of the proximal right subclavian artery near its origin, however the vessel was widely patent on a 2015 chest CT and the current appearance could be artifactual. The cervical carotid arteries are widely patent without evidence of stenosis or dissection. The vertebral arteries are patent and codominant with antegrade flow bilaterally. The vertebral artery origins are not well evaluated bilaterally due to mild motion artifact, and underlying stenosis is not excluded. There is no evidence of significant vertebral artery stenosis elsewhere. MRA HEAD FINDINGS The the visualized distal vertebral arteries are widely patent to the basilar, tortuous, and codominant. Right PICA origin is patent. Left PICA is not clearly identified. AICA and SCA origins are patent. Basilar artery is widely patent. PCAs are patent without evidence of significant stenosis. The internal carotid arteries are patent from skullbase to carotid termini without evidence of significant stenosis allowing for mild motion artifact in the supraclinoid segments. ACAs and MCAs are patent without evidence of major branch occlusion or significant proximal stenosis. The right A1 segment is mildly dominant. No intracranial aneurysm is identified. IMPRESSION: 1. No evidence of major intracranial branch occlusion or significant stenosis. 2. Widely patent cervical carotid arteries. 3. Possible severe proximal right subclavian artery stenosis. However, a normal appearance on a 2015 chest  CT and the lack of significant stenosis elsewhere may indicate that this appearance is at least partly artifactual in nature. Consider neck CTA for further assessment of the right subclavian artery as well as the vertebral artery origins which were not well evaluated on this study. Electronically Signed   By: Sebastian AcheAllen  Grady M.D.   On: 12/20/2015 11:25   Mr Brain Wo Contrast  Result Date: 12/19/2015 CLINICAL DATA:  Weakness after stroke 1 month ago. Diffuse pain. Ataxia. History of hypertension and diabetes. EXAM: MRI HEAD WITHOUT CONTRAST TECHNIQUE: Multiplanar, multiecho pulse sequences of the brain and surrounding structures were obtained without intravenous contrast. COMPARISON:  CT HEAD December 19, 2015 at 1534 hours FINDINGS: BRAIN: 5 mm rounded reduced diffusion RIGHT external capsule/subinsula. No susceptibility artifact to suggest hemorrhage. The ventricles and sulci are normal for patient's age. Old LEFT posterior limb of the internal capsule lacunar infarct with T2 shine through. Scattered subcentimeter supratentorial white matter T2 hyperintensities. No suspicious parenchymal signal, masses or mass effect. No abnormal extra-axial fluid collections. No extra-axial masses though, contrast enhanced sequences would be  more sensitive. VASCULAR: Normal major intracranial vascular flow voids present at skull base. SKULL AND UPPER CERVICAL SPINE: No abnormal sellar expansion. No suspicious calvarial bone marrow signal. Craniocervical junction maintained. Small amount of presumed pannus at the odontoid process mildly effaces the ventral thecal sac. SINUSES/ORBITS: Trace maxillary sinus mucosal thickening without paranasal sinus air-fluid levels. The included ocular globes and orbital contents are non-suspicious. OTHER: None. Other: Patient is edentulous. 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  Electronically Signed   By: Awilda Metro M.D.   On: 12/19/2015 20:08   Mr Maxine Glenn Head/brain RU Cm  Result Date: 12/20/2015 CLINICAL DATA:  Falls.  Acute right external capsule infarct on MRI. EXAM: MRA NECK WITHOUT AND WITH CONTRAST MRA HEAD WITHOUT CONTRAST TECHNIQUE: Multiplanar and multiecho pulse sequences of the neck were obtained without and with intravenous contrast. Angiographic images of the neck were obtained using MRA technique without and with intravenous contast.; Angiographic images of the Circle of Willis were obtained using MRA technique without intravenous contrast. CONTRAST:  15mL MULTIHANCE GADOBENATE DIMEGLUMINE 529 MG/ML IV SOLN COMPARISON:  Chest CT 09/07/2013. FINDINGS: MRA NECK FINDINGS Three vessel aortic arch. There is an apparent severe stenosis of the proximal right subclavian artery near its origin, however the vessel was widely patent on a 2015 chest CT and the current appearance could be artifactual. The cervical carotid arteries are widely patent without evidence of stenosis or dissection. The vertebral arteries are patent and codominant with antegrade flow bilaterally. The vertebral artery origins are not well evaluated bilaterally due to mild motion artifact, and underlying stenosis is not excluded. There is no evidence of significant vertebral artery stenosis elsewhere. MRA HEAD FINDINGS The the visualized distal vertebral arteries are widely patent to the basilar, tortuous, and codominant. Right PICA origin is patent. Left PICA is not clearly identified. AICA and SCA origins are patent. Basilar artery is widely patent. PCAs are patent without evidence of significant stenosis. The internal carotid arteries are patent from skullbase to carotid termini without evidence of significant stenosis allowing for mild motion artifact in the supraclinoid segments. ACAs and MCAs are patent without evidence of major branch occlusion or significant proximal stenosis. The right A1 segment  is mildly dominant. No intracranial aneurysm is identified. IMPRESSION: 1. No evidence of major intracranial branch occlusion or significant stenosis. 2. Widely patent cervical carotid arteries. 3. Possible severe proximal right subclavian artery stenosis. However, a normal appearance on a 2015 chest CT and the lack of significant stenosis elsewhere may indicate that this appearance is at least partly artifactual in nature. Consider neck CTA for further assessment of the right subclavian artery as well as the vertebral artery origins which were not well evaluated on this study. Electronically Signed   By: Sebastian Ache M.D.   On: 12/20/2015 11:25    Micro Results    No results found for this or any previous visit (from the past 240 hour(s)).     Today   Subjective:   [name] today has No complaints, stable for discharge.  Objective:   Blood pressure (!) 143/95, pulse 78, temperature 98 F (36.7 C), temperature source Oral, resp. rate 18, height 5\' 3"  (1.6 m), weight 72.6 kg (160 lb), SpO2 96 %.   Intake/Output Summary (Last 24 hours) at 12/21/15 1127 Last data filed at 12/21/15 0900  Gross per 24 hour  Intake  840 ml  Output                0 ml  Net              840 ml    Exam Awake Alert, Oriented x 3, No new F.N deficits, Normal affect Canon.AT,PERRAL Supple Neck,No JVD, No cervical lymphadenopathy appriciated.  Symmetrical Chest wall movement, Good air movement bilaterally, CTAB RRR,No Gallops,Rubs or new Murmurs, No Parasternal Heave +ve B.Sounds, Abd Soft, Non tender, No organomegaly appriciated, No rebound -guarding or rigidity. No Cyanosis, Clubbing or edema, No new Rash or bruise  Data Review   CBC w Diff:  Lab Results  Component Value Date   WBC 7.2 12/19/2015   HGB 12.3 12/19/2015   HGB 12.6 09/25/2013   HCT 37.7 12/19/2015   HCT 37.9 09/25/2013   PLT 189 12/19/2015   PLT 177 09/25/2013   LYMPHOPCT 20 12/19/2015   LYMPHOPCT 10.8 09/25/2013    MONOPCT 4 12/19/2015   MONOPCT 2.5 09/25/2013   EOSPCT 5 12/19/2015   EOSPCT 0.0 09/25/2013   BASOPCT 1 12/19/2015   BASOPCT 0.1 09/25/2013    CMP:  Lab Results  Component Value Date   NA 139 12/19/2015   NA 137 09/25/2013   K 4.5 12/19/2015   K 4.0 09/25/2013   CL 101 12/19/2015   CL 99 09/25/2013   CO2 30 12/19/2015   CO2 28 09/25/2013   BUN 27 (H) 12/19/2015   BUN 26 (H) 09/25/2013   CREATININE 1.12 (H) 12/19/2015   CREATININE 0.96 09/25/2013   PROT 7.4 12/19/2015   PROT 7.8 05/01/2012   ALBUMIN 3.3 (L) 12/19/2015   ALBUMIN 3.5 05/01/2012   BILITOT 0.4 12/19/2015   BILITOT 0.4 05/01/2012   ALKPHOS 121 12/19/2015   ALKPHOS 126 05/01/2012   AST 24 12/19/2015   AST 15 05/01/2012   ALT 13 (L) 12/19/2015   ALT 19 05/01/2012  .   Total Time in preparing paper work, data evaluation and todays exam - 35 minutes  [name] M.D on 12/21/2015 at 11:27 AM    Note: This dictation was prepared with Dragon dictation along with smaller phrase technology. Any transcriptional errors that result from this process are unintentional.

## 2015-12-21 NOTE — Progress Notes (Addendum)
Speech Therapy Note: reviewed chart notes; consulted NSG re: pt's status today. Pt is tolerating her current oral diet w/ no overt s/s of aspiration noted; tolerating oral medications. Pt has exhibited an inconsistent word-finding issue when ordering from the menu for lunch; NSG reported pt is verbally conversive in functional manner to indicate wants/needs per NSG. Per chart notes, pt had a R CVA in August 2017 and [facility]. ST services recommended for a formal Cognitive-linguistic assessment to determine needs/poc. Pt agreed. ST services will f/u while admitted next 1-3 days.

## 2016-01-09 ENCOUNTER — Encounter: Payer: Self-pay | Admitting: *Deleted

## 2016-01-09 ENCOUNTER — Emergency Department: Payer: Medicaid Other

## 2016-01-09 ENCOUNTER — Inpatient Hospital Stay (HOSPITAL_COMMUNITY)
Admit: 2016-01-09 | Discharge: 2016-01-09 | Disposition: A | Discharge status: Home / Self Care | Payer: Medicaid Other | Attending: Internal Medicine | Admitting: Internal Medicine

## 2016-01-09 ENCOUNTER — Inpatient Hospital Stay
Admission: EM | Admit: 2016-01-09 | Discharge: 2016-01-11 | DRG: 093 | Disposition: A | Discharge status: Home Health | Payer: Medicaid Other | Attending: Internal Medicine | Admitting: Internal Medicine

## 2016-01-09 DIAGNOSIS — G4700 Insomnia, unspecified: Secondary | ICD-10-CM | POA: Diagnosis present

## 2016-01-09 DIAGNOSIS — Z9981 Dependence on supplemental oxygen: Secondary | ICD-10-CM | POA: Diagnosis not present

## 2016-01-09 DIAGNOSIS — Z794 Long term (current) use of insulin: Secondary | ICD-10-CM

## 2016-01-09 DIAGNOSIS — R32 Unspecified urinary incontinence: Secondary | ICD-10-CM | POA: Diagnosis present

## 2016-01-09 DIAGNOSIS — I69398 Other sequelae of cerebral infarction: Secondary | ICD-10-CM

## 2016-01-09 DIAGNOSIS — Y92002 Bathroom of unspecified non-institutional (private) residence single-family (private) house as the place of occurrence of the external cause: Secondary | ICD-10-CM

## 2016-01-09 DIAGNOSIS — R29898 Other symptoms and signs involving the musculoskeletal system: Secondary | ICD-10-CM

## 2016-01-09 DIAGNOSIS — J449 Chronic obstructive pulmonary disease, unspecified: Secondary | ICD-10-CM | POA: Diagnosis present

## 2016-01-09 DIAGNOSIS — R262 Difficulty in walking, not elsewhere classified: Secondary | ICD-10-CM

## 2016-01-09 DIAGNOSIS — R159 Full incontinence of feces: Secondary | ICD-10-CM | POA: Diagnosis present

## 2016-01-09 DIAGNOSIS — F419 Anxiety disorder, unspecified: Secondary | ICD-10-CM | POA: Diagnosis present

## 2016-01-09 DIAGNOSIS — F17210 Nicotine dependence, cigarettes, uncomplicated: Secondary | ICD-10-CM | POA: Diagnosis present

## 2016-01-09 DIAGNOSIS — I10 Essential (primary) hypertension: Secondary | ICD-10-CM | POA: Diagnosis present

## 2016-01-09 DIAGNOSIS — R2681 Unsteadiness on feet: Secondary | ICD-10-CM

## 2016-01-09 DIAGNOSIS — I639 Cerebral infarction, unspecified: Secondary | ICD-10-CM | POA: Diagnosis not present

## 2016-01-09 DIAGNOSIS — R531 Weakness: Secondary | ICD-10-CM | POA: Diagnosis not present

## 2016-01-09 DIAGNOSIS — E1165 Type 2 diabetes mellitus with hyperglycemia: Secondary | ICD-10-CM | POA: Diagnosis present

## 2016-01-09 DIAGNOSIS — T43015A Adverse effect of tricyclic antidepressants, initial encounter: Secondary | ICD-10-CM | POA: Diagnosis present

## 2016-01-09 DIAGNOSIS — R296 Repeated falls: Principal | ICD-10-CM | POA: Diagnosis present

## 2016-01-09 DIAGNOSIS — Z7951 Long term (current) use of inhaled steroids: Secondary | ICD-10-CM

## 2016-01-09 DIAGNOSIS — Z8249 Family history of ischemic heart disease and other diseases of the circulatory system: Secondary | ICD-10-CM

## 2016-01-09 DIAGNOSIS — W19XXXA Unspecified fall, initial encounter: Secondary | ICD-10-CM | POA: Diagnosis present

## 2016-01-09 DIAGNOSIS — E785 Hyperlipidemia, unspecified: Secondary | ICD-10-CM | POA: Diagnosis present

## 2016-01-09 DIAGNOSIS — T43215A Adverse effect of selective serotonin and norepinephrine reuptake inhibitors, initial encounter: Secondary | ICD-10-CM | POA: Diagnosis present

## 2016-01-09 DIAGNOSIS — Z79899 Other long term (current) drug therapy: Secondary | ICD-10-CM | POA: Diagnosis not present

## 2016-01-09 DIAGNOSIS — M6281 Muscle weakness (generalized): Secondary | ICD-10-CM

## 2016-01-09 DIAGNOSIS — Z7982 Long term (current) use of aspirin: Secondary | ICD-10-CM

## 2016-01-09 HISTORY — DX: Cerebral infarction, unspecified: I63.9

## 2016-01-09 LAB — BASIC METABOLIC PANEL
Anion gap: 9 (ref 5–15)
BUN: 22 mg/dL — AB (ref 6–20)
CO2: 27 mmol/L (ref 22–32)
Calcium: 8.9 mg/dL (ref 8.9–10.3)
Chloride: 102 mmol/L (ref 101–111)
Creatinine, Ser: 1.19 mg/dL — ABNORMAL HIGH (ref 0.44–1.00)
GFR calc Af Amer: 58 mL/min — ABNORMAL LOW (ref >60)
GFR, EST NON AFRICAN AMERICAN: 50 mL/min — AB (ref >60)
GLUCOSE: 453 mg/dL — AB (ref 65–99)
POTASSIUM: 4.3 mmol/L (ref 3.5–5.1)
Sodium: 138 mmol/L (ref 135–145)

## 2016-01-09 LAB — CBC
HCT: 35.4 % (ref 35.0–47.0)
Hemoglobin: 11.9 g/dL — ABNORMAL LOW (ref 12.0–16.0)
MCH: 29.6 pg (ref 26.0–34.0)
MCHC: 33.6 g/dL (ref 32.0–36.0)
MCV: 88.1 fL (ref 80.0–100.0)
PLATELETS: 224 10*3/uL (ref 150–440)
RBC: 4.01 MIL/uL (ref 3.80–5.20)
RDW: 14.7 % — ABNORMAL HIGH (ref 11.5–14.5)
WBC: 5.7 10*3/uL (ref 3.6–11.0)

## 2016-01-09 LAB — URINE DRUG SCREEN, QUALITATIVE (ARMC ONLY)
AMPHETAMINES, UR SCREEN: NOT DETECTED
Barbiturates, Ur Screen: NOT DETECTED
Benzodiazepine, Ur Scrn: POSITIVE — AB
COCAINE METABOLITE, UR ~~LOC~~: NOT DETECTED
Cannabinoid 50 Ng, Ur ~~LOC~~: NOT DETECTED
MDMA (ECSTASY) UR SCREEN: NOT DETECTED
Methadone Scn, Ur: NOT DETECTED
OPIATE, UR SCREEN: NOT DETECTED
PHENCYCLIDINE (PCP) UR S: NOT DETECTED
Tricyclic, Ur Screen: POSITIVE — AB

## 2016-01-09 LAB — TROPONIN I: Troponin I: <0.03 ng/mL (ref <0.03)

## 2016-01-09 LAB — BLOOD GAS, VENOUS
ACID-BASE EXCESS: 6 mmol/L — AB (ref 0.0–2.0)
BICARBONATE: 32.5 mmol/L — AB (ref 20.0–28.0)
PATIENT TEMPERATURE: 37
pCO2, Ven: 55 mmHg (ref 44.0–60.0)
pH, Ven: 7.38 (ref 7.250–7.430)
pO2, Ven: <31 mmHg — CL (ref 32.0–45.0)

## 2016-01-09 LAB — GLUCOSE, CAPILLARY
Glucose-Capillary: 402 mg/dL — ABNORMAL HIGH (ref 65–99)
Glucose-Capillary: 311 mg/dL — ABNORMAL HIGH (ref 65–99)

## 2016-01-09 MED ORDER — SODIUM CHLORIDE 0.9 % IV SOLN
INTRAVENOUS | Status: DC
  Administered 2016-01-09 (×2): via INTRAVENOUS

## 2016-01-09 MED ORDER — INSULIN ASPART 100 UNIT/ML ~~LOC~~ SOLN
8.0000 [IU] | Freq: Once | SUBCUTANEOUS | Status: AC
  Administered 2016-01-09: 8 [IU] via SUBCUTANEOUS
  Filled 2016-01-09: qty 8

## 2016-01-09 MED ORDER — ZOLPIDEM TARTRATE 5 MG PO TABS
5.0000 mg | ORAL_TABLET | Freq: Every evening | ORAL | Status: DC | PRN
Start: 2016-01-09 — End: 2016-01-10
  Administered 2016-01-09: 22:00:00 5 mg via ORAL
  Filled 2016-01-09: qty 1

## 2016-01-09 MED ORDER — GABAPENTIN 300 MG PO CAPS
600.0000 mg | ORAL_CAPSULE | Freq: Two times a day (BID) | ORAL | Status: DC
  Administered 2016-01-09 – 2016-01-11 (×4): 600 mg via ORAL
  Filled 2016-01-09 (×4): qty 2

## 2016-01-09 MED ORDER — TIOTROPIUM BROMIDE MONOHYDRATE 18 MCG IN CAPS
18.0000 ug | ORAL_CAPSULE | Freq: Every day | RESPIRATORY_TRACT | Status: DC
Start: 2016-01-10 — End: 2016-01-11
  Administered 2016-01-10 – 2016-01-11 (×2): 18 ug via RESPIRATORY_TRACT
  Filled 2016-01-09: qty 5

## 2016-01-09 MED ORDER — ALBUTEROL SULFATE (2.5 MG/3ML) 0.083% IN NEBU: 2.5000 mg | INHALATION_SOLUTION | Freq: Four times a day (QID) | RESPIRATORY_TRACT | Status: DC | PRN

## 2016-01-09 MED ORDER — INSULIN GLARGINE 100 UNIT/ML ~~LOC~~ SOLN
20.0000 [IU] | Freq: Every day | SUBCUTANEOUS | Status: DC
  Administered 2016-01-09: 22:00:00 20 [IU] via SUBCUTANEOUS
  Filled 2016-01-09 (×2): qty 0.2

## 2016-01-09 MED ORDER — ASPIRIN EC 325 MG PO TBEC: 325.0000 mg | DELAYED_RELEASE_TABLET | Freq: Every day | ORAL | Status: DC

## 2016-01-09 MED ORDER — METOPROLOL TARTRATE 25 MG PO TABS
25.0000 mg | ORAL_TABLET | Freq: Two times a day (BID) | ORAL | Status: DC
  Administered 2016-01-09 – 2016-01-11 (×4): 25 mg via ORAL
  Filled 2016-01-09 (×4): qty 1

## 2016-01-09 MED ORDER — IPRATROPIUM-ALBUTEROL 0.5-2.5 (3) MG/3ML IN SOLN: 3.0000 mL | Freq: Four times a day (QID) | RESPIRATORY_TRACT | Status: DC | PRN

## 2016-01-09 MED ORDER — SENNOSIDES-DOCUSATE SODIUM 8.6-50 MG PO TABS: 1.0000 | ORAL_TABLET | Freq: Every evening | ORAL | Status: DC | PRN

## 2016-01-09 MED ORDER — AMLODIPINE BESYLATE 10 MG PO TABS
10.0000 mg | ORAL_TABLET | Freq: Every day | ORAL | Status: DC
  Administered 2016-01-10 – 2016-01-11 (×2): 10 mg via ORAL
  Filled 2016-01-09 (×2): qty 1

## 2016-01-09 MED ORDER — TRAZODONE HCL 100 MG PO TABS
200.0000 mg | ORAL_TABLET | Freq: Every day | ORAL | Status: DC
  Administered 2016-01-09: 22:00:00 200 mg via ORAL
  Filled 2016-01-09: qty 2

## 2016-01-09 MED ORDER — LISINOPRIL 5 MG PO TABS
2.5000 mg | ORAL_TABLET | Freq: Every day | ORAL | Status: DC
  Administered 2016-01-09 – 2016-01-11 (×3): 2.5 mg via ORAL
  Filled 2016-01-09 (×4): qty 1

## 2016-01-09 MED ORDER — MOMETASONE FURO-FORMOTEROL FUM 200-5 MCG/ACT IN AERO
2.0000 | INHALATION_SPRAY | Freq: Two times a day (BID) | RESPIRATORY_TRACT | Status: DC
  Administered 2016-01-09 – 2016-01-11 (×4): 2 via RESPIRATORY_TRACT
  Filled 2016-01-09: qty 8.8

## 2016-01-09 MED ORDER — ATORVASTATIN CALCIUM 20 MG PO TABS
40.0000 mg | ORAL_TABLET | Freq: Every day | ORAL | Status: DC
  Administered 2016-01-09 – 2016-01-10 (×2): 40 mg via ORAL
  Filled 2016-01-09 (×2): qty 2

## 2016-01-09 MED ORDER — OXYCODONE-ACETAMINOPHEN 5-325 MG PO TABS
1.0000 | ORAL_TABLET | ORAL | Status: AC
  Administered 2016-01-09: 1 via ORAL
  Filled 2016-01-09: qty 1

## 2016-01-09 MED ORDER — ENOXAPARIN SODIUM 40 MG/0.4ML ~~LOC~~ SOLN
40.0000 mg | SUBCUTANEOUS | Status: DC
  Administered 2016-01-09 – 2016-01-10 (×2): 40 mg via SUBCUTANEOUS
  Filled 2016-01-09 (×2): qty 0.4

## 2016-01-09 MED ORDER — MAGNESIUM OXIDE 400 (241.3 MG) MG PO TABS
400.0000 mg | ORAL_TABLET | Freq: Every day | ORAL | Status: DC
  Administered 2016-01-09 – 2016-01-10 (×2): 400 mg via ORAL
  Filled 2016-01-09 (×2): qty 1

## 2016-01-09 MED ORDER — VORTIOXETINE HBR 10 MG PO TABS
1.0000 | ORAL_TABLET | Freq: Every day | ORAL | Status: DC
Start: 2016-01-09 — End: 2016-01-11
  Administered 2016-01-10 – 2016-01-11 (×2): 10 mg via ORAL
  Filled 2016-01-09 (×3): qty 1

## 2016-01-09 MED ORDER — ASPIRIN 325 MG PO TABS
325.0000 mg | ORAL_TABLET | Freq: Every day | ORAL | Status: DC
  Administered 2016-01-09 – 2016-01-11 (×3): 325 mg via ORAL
  Filled 2016-01-09 (×3): qty 1

## 2016-01-09 MED ORDER — MELATONIN 5 MG PO TABS
5.0000 mg | ORAL_TABLET | Freq: Every day | ORAL | Status: DC
  Administered 2016-01-09 – 2016-01-10 (×2): 5 mg via ORAL
  Filled 2016-01-09 (×3): qty 1

## 2016-01-09 MED ORDER — STROKE: EARLY STAGES OF RECOVERY BOOK
Freq: Once | Status: AC
Start: 2016-01-09 — End: 2016-01-09
  Administered 2016-01-09: 17:00:00

## 2016-01-09 MED ORDER — ALPRAZOLAM 0.5 MG PO TABS
1.0000 mg | ORAL_TABLET | Freq: Four times a day (QID) | ORAL | Status: DC | PRN
  Administered 2016-01-10 – 2016-01-11 (×2): 1 mg via ORAL
  Filled 2016-01-09 (×3): qty 2

## 2016-01-09 MED ORDER — FLUTICASONE PROPIONATE 50 MCG/ACT NA SUSP
2.0000 | Freq: Every day | NASAL | Status: DC | PRN
  Filled 2016-01-09: qty 16

## 2016-01-09 MED ORDER — AMITRIPTYLINE HCL 25 MG PO TABS
25.0000 mg | ORAL_TABLET | Freq: Three times a day (TID) | ORAL | Status: DC
  Administered 2016-01-09 – 2016-01-10 (×2): 25 mg via ORAL
  Filled 2016-01-09 (×2): qty 1

## 2016-01-09 MED ORDER — OXYCODONE-ACETAMINOPHEN 7.5-325 MG PO TABS
1.0000 | ORAL_TABLET | ORAL | Status: DC | PRN
  Administered 2016-01-09 – 2016-01-10 (×2): 1 via ORAL
  Filled 2016-01-09 (×2): qty 1

## 2016-01-09 MED ORDER — INSULIN ASPART 100 UNIT/ML FLEXPEN: 10.0000 [IU] | PEN_INJECTOR | Freq: Three times a day (TID) | SUBCUTANEOUS | Status: DC

## 2016-01-09 MED ORDER — ASPIRIN 300 MG RE SUPP
300.0000 mg | Freq: Every day | RECTAL | Status: DC
  Filled 2016-01-09: qty 1

## 2016-01-09 MED ORDER — SODIUM CHLORIDE 0.9 % IV BOLUS (SEPSIS)
1000.0000 mL | Freq: Once | INTRAVENOUS | Status: AC
  Administered 2016-01-09: 1000 mL via INTRAVENOUS

## 2016-01-09 NOTE — Progress Notes (Signed)
Chaplain received a page to visit with a pt in room 108. Provided information regarding an Scientist, water qualityAdvanced Directive.     01/09/16 1727  Clinical Encounter Type  Visited With Patient  Visit Type Initial  Referral From Nurse  Consult/Referral To Chaplain  Spiritual Encounters  Spiritual Needs Other (Comment)

## 2016-01-09 NOTE — Progress Notes (Signed)
Family Meeting Note  Advance Directive:yes  Today a meeting took place with the Patient.  The following clinical team members were present during this meeting:MD  The following were discussed:Patient's diagnosis: , Patient's progosis: > 12 months and Goals for treatment: Continue present management  Additional follow-up to be provided: None, Can consider palliative care as an outpatient if need  Time spent during discussion:20 minutes  Teresa LovettVipul [name], MD

## 2016-01-09 NOTE — ED Triage Notes (Addendum)
Pt arrives via EMS from home, pt states she was at [facility] yesterday and they "werent nice" so she left, states they diagnosed her with a CVA, pt called EMS today because she felt as if she was having another stroke, EMS reports CBG of 499, pt takes insulin but states she forgot yesterday, states multiple falls over the past month, brusing noted on arms and right eye, at present pt complaining of back pain

## 2016-01-09 NOTE — ED Provider Notes (Signed)
[facility] Emergency Department Provider Note  ____________________________________________   First MD Initiated Contact with Patient 01/09/16 1229     (approximate)  I have reviewed the triage vital signs and the nursing notes.   HISTORY  Chief Complaint Hyperglycemia and Weakness   HPI [name] is a 55 y.o. female history diabetes, anxiety, hypertension, and previous strokes.  Patient reports that she's been feeling like she is falling more frequently than usual over the last few days. She is followed of the wheelchair and her walker several times. She reports due to these falls she went to [facility] yesterday and they told her she had a new stroke, but she did not like the care that she was receiving and thus left the hospital though they recommended admission.  Since leaving she denies any new changes, but does report ongoing trouble with walking with her walker and wheelchair feeling more weak than normal. No chest pain. Reports persistent mild weakness on her right side. She also has severe problems with walking and coordination from her previous strokes.  Tells me she is currently taking medicine for her blood pressure and diabetes as prescribed.  Patient has no clear onset of a fairly acute change except reports last few days, no acute change last 24 hours. Coordination and walking he seems slightly worse.  Past Medical History:  Diagnosis Date  . Anxiety   . Asthma   . Coarse tremors   . COPD (chronic obstructive pulmonary disease) (HCC)    on 3L home o2  . CVA (cerebral vascular accident) (HCC)   . 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 Hyperlipidemia   . Hypertension   . Insomnia     Patient Active Problem List   Diagnosis Date Noted  . Stroke (HCC) 12/19/2015  . Diabetes (HCC) 12/19/2015  . Anxiety 08/31/2015  . Depression 08/31/2015  . Moderate recurrent major depression (HCC) 08/31/2015  . Grief  at loss of child 07/23/2015  . Acute respiratory failure (HCC)   . COPD (chronic obstructive pulmonary disease) (HCC) 07/17/2015    Past Surgical History:  Procedure Laterality Date  . BACK SURGERY     x 2 lumbar spine  . BRONCHOSCOPY    . CERVICAL SPINE SURGERY    . CHOLECYSTECTOMY    . FOOT SURGERY      Prior to Admission medications   Medication Sig Start Date End Date Taking? Authorizing Provider  albuterol (PROVENTIL) (2.5 MG/3ML) 0.083% nebulizer solution Take 2.5 mg by nebulization every 6 (six) hours as needed for wheezing or shortness of breath.   Yes Historical Provider, MD  albuterol-ipratropium (COMBIVENT) 18-103 MCG/ACT inhaler Inhale 1 puff into the lungs every 6 (six) hours as needed for wheezing or shortness of breath.   Yes Historical Provider, MD  ALPRAZolam Prudy Feeler) 1 MG tablet Take 1 mg by mouth 4 (four) times daily as needed for anxiety.    Yes Historical Provider, MD  amitriptyline (ELAVIL) 25 MG tablet Take 25 mg by mouth 3 (three) times daily.   Yes Historical Provider, MD  amLODipine (NORVASC) 10 MG tablet Take 10 mg by mouth daily.   Yes Historical Provider, MD  aspirin EC 81 MG tablet Take by mouth daily.   Yes Historical Provider, MD  atorvastatin (LIPITOR) 40 MG tablet Take 40 mg by mouth at bedtime.   Yes Historical Provider, MD  fluticasone (FLONASE) 50 MCG/ACT nasal spray Place 2 sprays into both nostrils daily as needed  for rhinitis.   Yes Historical Provider, MD  Fluticasone-Salmeterol (ADVAIR) 250-50 MCG/DOSE AEPB Inhale 1 puff into the lungs 2 (two) times daily.   Yes Historical Provider, MD  gabapentin (NEURONTIN) 300 MG capsule Take 2 capsules (600 mg total) by mouth 2 (two) times daily. Patient taking differently: Take 600 mg by mouth 3 (three) times daily.  12/21/15  Yes Katha Hamming, MD  insulin aspart (NOVOLOG) 100 UNIT/ML injection Inject 0-5 Units into the skin at bedtime. Patient taking differently: Inject 21 Units into the skin 3  (three) times daily with meals.  12/21/15  Yes Katha Hamming, MD  magnesium oxide (MAG-OX) 400 (241.3 Mg) MG tablet Take 400 mg by mouth 2 (two) times daily.    Yes Historical Provider, MD  Melatonin 3 MG TABS Take 1 tablet by mouth at bedtime.   Yes Historical Provider, MD  metoprolol tartrate (LOPRESSOR) 25 MG tablet Take 25 mg by mouth 2 (two) times daily.   Yes Historical Provider, MD  oxyCODONE-acetaminophen (PERCOCET) 7.5-325 MG tablet Take 1 tablet by mouth every 4 (four) hours as needed for severe pain. Every 4-6 hours   Yes Historical Provider, MD  tiotropium (SPIRIVA) 18 MCG inhalation capsule Place 18 mcg into inhaler and inhale daily.   Yes Historical Provider, MD  traZODone (DESYREL) 100 MG tablet Take 100 mg by mouth at bedtime as needed for sleep.    Yes Historical Provider, MD  vortioxetine HBr (TRINTELLIX) 10 MG TABS Take 1 tablet by mouth daily.   Yes Historical Provider, MD  zolpidem (AMBIEN) 10 MG tablet Take 10 mg by mouth at bedtime as needed for sleep.   Yes Historical Provider, MD  aspirin EC 325 MG EC tablet Take 1 tablet (325 mg total) by mouth daily. Patient not taking: Reported on 01/09/2016 12/22/15   Katha Hamming, MD  insulin glargine (LANTUS) 100 unit/mL SOPN Inject 0.2 mLs (20 Units total) into the skin at bedtime. Patient taking differently: Inject 72 Units into the skin at bedtime.  12/21/15   Katha Hamming, MD  lisinopril (PRINIVIL,ZESTRIL) 2.5 MG tablet Take 2.5 mg by mouth daily.    Historical Provider, MD    Allergies Lyrica [pregabalin]; Penicillins; Prozac [fluoxetine hcl]; and Vicodin [hydrocodone-acetaminophen]  Family History  Problem Relation Age of Onset  . CAD Mother   . Hypertension Mother   . Throat cancer Father     Social History Social History  Substance Use Topics  . Smoking status: Current Every Day Smoker    Packs/day: 0.50    Types: Cigarettes  . Smokeless tobacco: Never Used  . Alcohol use No    Review of  Systems Constitutional: No fever/chills. Reports feeling tired, fatigued for several weeks. Reports increased falls last couple days. Denies any loss of consciousness or neck pain. Reports she is having ongoing chronic lower back pain. Eyes: No visual changes. ENT: No sore throat. Cardiovascular: Denies chest pain. Respiratory: Denies shortness of breath. Gastrointestinal: No abdominal pain.  No nausea, no vomiting.   Genitourinary: Negative for dysuria. Musculoskeletal: Negative for back pain. Skin: Negative for rash. Neurological: Negative for headaches, focal weakness or numbness.  10-point ROS otherwise negative.  ____________________________________________   PHYSICAL EXAM:  VITAL SIGNS: ED Triage Vitals [01/09/16 1226]  Enc Vitals Group     BP (!) 157/95     Pulse Rate 91     Resp (!) 22     Temp 98 F (36.7 C)     Temp Source Oral     SpO2  93 %     Weight 160 lb (72.6 kg)     Height 5\' 3"  (1.6 m)     Head Circumference      Peak Flow      Pain Score 9     Pain Loc      Pain Edu?      Excl. in GC?     Constitutional: Alert and Fatigued, but fully oriented. Patient initially resting on my arrival to the room. Eyes: Conjunctivae are normal. PERRL. EOMI. Head: Atraumatic except for a slight area of tenderness over the right posterior scalp which the patient reports resulted from a fall a couple days ago. Nose: No congestion/rhinnorhea. Mouth/Throat: Mucous membranes are slightly dry.  Oropharynx non-erythematous. Neck: No stridor.  No cervical tenderness with normal range of motion. Cardiovascular: Normal rate, regular rhythm. Grossly normal heart sounds.  Good peripheral circulation. Respiratory: Normal respiratory effort.  No retractions. Lungs CTAB. No wheezes. Gastrointestinal: Soft and nontender. No distention.  Musculoskeletal: No lower extremity tenderness nor edema.   Neurologic:  Normal speech and language.   NIH score equals 4, performed by me at  bedside. The patient has mild right pronator drift. The patient has normal cranial nerve exam. Extraocular movements are normal. Visual fields are normal. Patient has 5 over 5 strength in the upper extremities bilaterally. Approximately 4-5 in the left lower extremity, in 3-5 the right lower extremity. Patient reports chronic weakness on the right side from previous stroke. Minimal ataxia of the upper extremities, patient reports feeling too weak to evaluate for ataxia in the lower extremity. There is no numbness or gross, acute sensory abnormality in the extremities bilaterally. No speech disturbance. No dysarthria. No aphasia. Normal finger nose finger bilat. Patient speaking in full and clear sentences.   Skin:  Skin is warm, dry and intact. No rash noted. Psychiatric: Mood and affect are normal. Speech and behavior are normal.  ____________________________________________   LABS (all labs ordered are listed, but only abnormal results are displayed)  Labs Reviewed  GLUCOSE, CAPILLARY - Abnormal; Notable for the following:       Result Value   Glucose-Capillary 402 (*)    All other components within normal limits  CBC - Abnormal; Notable for the following:    Hemoglobin 11.9 (*)    RDW 14.7 (*)    All other components within normal limits  BASIC METABOLIC PANEL - Abnormal; Notable for the following:    Glucose, Bld 453 (*)    BUN 22 (*)    Creatinine, Ser 1.19 (*)    GFR calc non Af Amer 50 (*)    GFR calc Af Amer 58 (*)    All other components within normal limits  URINE DRUG SCREEN, QUALITATIVE ([facility] ONLY) - Abnormal; Notable for the following:    Tricyclic, Ur Screen POSITIVE (*)    Benzodiazepine, Ur Scrn POSITIVE (*)    All other components within normal limits  BLOOD GAS, VENOUS - Abnormal; Notable for the following:    pO2, Ven <31.0 (*)    Bicarbonate 32.5 (*)    Acid-Base Excess 6.0 (*)    All other components within normal limits  LIPID PANEL - Abnormal;  Notable for the following:    Triglycerides 527 (*)    HDL 32 (*)    All other components within normal limits  GLUCOSE, CAPILLARY - Abnormal; Notable for the following:    Glucose-Capillary 311 (*)    All other components within normal limits  TROPONIN I  HEMOGLOBIN A1C   ____________________________________________  EKG  Reviewed and interpreted me at 12:30 AM Heart rate 90 QRS 140 QTc 477 Right axis deviation Right bundle-branch block, expected T wave abnormalities and septal distribution consistent with right bundle. No evidence of acute ischemic change. ____________________________________________  RADIOLOGY  Not performed in the ER, but patient had CT of the head done yesterday at [facility] concerning for a new ischemic infarct in the right anterior.  [facility] REPORT EXAM: Computed tomography, head or brain without contrast material. DATE: 01/08/2016 3:56 PM ACCESSION: [id number] UN DICTATED: 01/08/2016 4:00 PM INTERPRETATION LOCATION: Main Campus  CLINICAL INDICATION: 55 years old Female with DIZZINESS AND GIDDINESS--  COMPARISON: 11/02/2015 CT head  TECHNIQUE: Axial CT images of the headfrom skull base to vertex without contrast.   FINDINGS: Sequela of prior left internal capsule and right thalamic infarcts are again visualized. New hypodensity within the left ACA consistent with remote infarct. There is no midline shift or mass lesion.There is no evidence of intracranial hemorrhage or acute infarct.No fractures are evident.The sinuses are pneumatized.Right posterior scalp hematoma. ____________________________________________   PROCEDURES  Procedure(s) performed: None  Procedures  Critical Care performed: No  ____________________________________________   INITIAL IMPRESSION / ASSESSMENT AND PLAN / ED COURSE  Pertinent labs & imaging results that were available during my care of the patient were reviewed by me and considered in my medical decision  making (see chart for details).  Patient presents for multiple falls. Review of notes shows this not first presentation and she has a history of previous stroke, and seen by Dr. Thad Ranger in the past. Denying any acute cardiac or pulmonary symptoms. Her neurologic exam from what she describes seems to be largely at baseline but I did not test her gait which she complains of being a major disturbance. CT of the head from [facility] demonstrates a possible new that somewhat remote infarct. We'll consult neurology, pain a sick labs urinalysis as well as a VBG is a patient has a history of previous respiratory failure although at present appears she is close to her if not at her baseline without any respiratory complaint.    Clinical Course as of Jan 10 907  Wed Jan 09, 2016  1425 Multiple pages as well as phone call to Dr. Thad Ranger, on call for neurology. Unable to reach her at this time over the last 2 hours. Lawernce Ion stroke coordinator nurse attempting to reach her now as well. Patient is not suspected to have an acute stroke which would be amenable to TPA or acute intervention, symptomatology exceeding 24 hours and a remote appearing infarct noted on [facility] CAT scan 24 hours ago  [MQ]    Clinical Course User Index [MQ] Sharyn Creamer, MD    Discussed with Dr. Thad Ranger neurology and she personally reviewed CT from [facility] yesterday as well as previous MRI at [facility] advises possibility of a new stroke however not necessarily conclusive. She recommends admission, obtaining an MRI to further evaluate for possible new stroke given findings at [facility] yesterday. Patient already on aspirin therapy, the patient is agreeable with admission at [facility]. Understands planned for MRI and further evaluation and consultation with Dr. Thad Ranger of neurology as an inpatient. ____________________________________________   FINAL CLINICAL IMPRESSION(S) / ED DIAGNOSES  Final diagnoses:  Fall  Fall  Fall  Fall  Poor  coordination    NEW MEDICATIONS STARTED DURING THIS VISIT:  Current Discharge Medication List       Note:  This document was prepared using Dragon voice  recognition software and may include unintentional dictation errors.     Sharyn CreamerMark Drayton Tieu, MD 01/10/16 [phone]

## 2016-01-09 NOTE — ED Notes (Signed)
Xray tech at bedside.

## 2016-01-09 NOTE — ED Notes (Signed)
Attempted to call report and nurse was busy and said she would call back in 5 min

## 2016-01-09 NOTE — ED Notes (Signed)
Patient placed in hospital gown and repositioned on the bed. Patient is disheveled, has dried feces on legs, under fingernails.

## 2016-01-09 NOTE — H&P (Signed)
[facility] [place] [facility]   PATIENT NAME: [name]    MR#:  [id number]  DATE OF BIRTH:  [birth year]  DATE OF ADMISSION:  01/09/2016  PRIMARY CARE PHYSICIAN: Nancy NordmannALVIN C POWELL JR, MD   REQUESTING/REFERRING PHYSICIAN: Sharyn CreamerMark Quale, MD  CHIEF COMPLAINT:   Chief Complaint  Patient presents with  . Hyperglycemia  . Weakness   HISTORY OF PRESENT ILLNESS:  [name]  is a 55 y.o. female with a known history of diabetes, anxiety, hypertension, and previous strokes.  Patient reports that she's been feeling like she is falling more frequently than usual over the last few days. She is followed of the wheelchair and her walker several times. She reports due to these falls she [facility] and they told her she needs admission for new stroke, but she did not like the care that she was receiving and thus left the hospital AMA.  Since leaving she denies any new changes, but does report ongoing trouble with walking with her walker and wheelchair feeling more weak than normal. No chest pain. Reports persistent mild weakness on her right side. She also has severe problems with walking and coordination from her previous strokes.  Patient reports that she is having difficulty balancing herself.  She is not able to walk by herself or stand by herself.  She says she fell in the bathroom.  She is followed by kitchen in the living room last night after [facility] and daughter got her up.  Has no head injury but complains of some lower back pain and shoulder pain, although she reports she has had a surgery in the lower back and she also had a rotator cuff surgery.  PAST MEDICAL HISTORY:   Past Medical History:  Diagnosis Date  . Anxiety   . Asthma   . Coarse tremors   . COPD (chronic obstructive pulmonary disease) (HCC)    on 3L home o2  . CVA (cerebral vascular accident) (HCC)   . 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 Hyperlipidemia   . Hypertension   . Insomnia    PAST SURGICAL HISTORY:   Past Surgical History:  Procedure Laterality Date  . BACK SURGERY     x 2 lumbar spine  . BRONCHOSCOPY    . CERVICAL SPINE SURGERY    . CHOLECYSTECTOMY    . FOOT SURGERY      SOCIAL HISTORY:   Social History  Substance Use Topics  . Smoking status: Current Every Day Smoker    Packs/day: 0.50    Types: Cigarettes  . Smokeless tobacco: Never Used  . Alcohol use No    FAMILY HISTORY:   Family History  Problem Relation Age of Onset  . CAD Mother   . Hypertension Mother   . Throat cancer Father    DRUG ALLERGIES:   Allergies  Allergen Reactions  . Lyrica [Pregabalin] Other (See Comments)    hallucination  . Penicillins   . Prozac [Fluoxetine Hcl] Other (See Comments)    hallucinations  . Vicodin [Hydrocodone-Acetaminophen] Rash   REVIEW OF SYSTEMS:   Review of Systems  Constitutional: Positive for malaise/fatigue. Negative for chills, fever and weight loss.  HENT: Negative for nosebleeds and sore throat.   Eyes: Negative for blurred vision.  Respiratory: Negative for cough, shortness of breath and wheezing.   Cardiovascular: Negative for chest pain, orthopnea, leg swelling and PND.  Gastrointestinal: Negative for abdominal  pain, constipation, diarrhea, heartburn, nausea and vomiting.  Genitourinary: Negative for dysuria and urgency.  Musculoskeletal: Positive for back pain, falls and joint pain (shoulder pain).  Skin: Negative for rash.  Neurological: Positive for weakness. Negative for dizziness, speech change, focal weakness and headaches.  Endo/Heme/Allergies: Does not bruise/bleed easily.  Psychiatric/Behavioral: Negative for depression.   MEDICATIONS AT HOME:   Prior to Admission medications   Medication Sig Start Date End Date Taking? Authorizing Provider  albuterol (PROVENTIL) (2.5 MG/3ML) 0.083% nebulizer solution Take 2.5 mg by nebulization every 6 (six) hours as needed  for wheezing or shortness of breath.   Yes Historical Provider, MD  albuterol-ipratropium (COMBIVENT) 18-103 MCG/ACT inhaler Inhale 1 puff into the lungs every 6 (six) hours as needed for wheezing or shortness of breath.   Yes Historical Provider, MD  ALPRAZolam Prudy Feeler) 1 MG tablet Take 1 mg by mouth 4 (four) times daily as needed for anxiety.    Yes Historical Provider, MD  amitriptyline (ELAVIL) 25 MG tablet Take 25 mg by mouth 3 (three) times daily.   Yes Historical Provider, MD  gabapentin (NEURONTIN) 300 MG capsule Take 2 capsules (600 mg total) by mouth 2 (two) times daily. 12/21/15  Yes Katha Hamming, MD  insulin aspart (NOVOLOG) 100 UNIT/ML injection Inject 0-5 Units into the skin at bedtime. Patient taking differently: Inject 21 Units into the skin 3 (three) times daily with meals.  12/21/15  Yes Katha Hamming, MD  oxyCODONE-acetaminophen (PERCOCET) 7.5-325 MG tablet Take 1 tablet by mouth every 4 (four) hours as needed for severe pain. Every 4-6 hours   Yes Historical Provider, MD  tiotropium (SPIRIVA) 18 MCG inhalation capsule Place 18 mcg into inhaler and inhale daily.   Yes Historical Provider, MD  aspirin EC 325 MG EC tablet Take 1 tablet (325 mg total) by mouth daily. 12/22/15   Katha Hamming, MD  atorvastatin (LIPITOR) 40 MG tablet Take 40 mg by mouth at bedtime.    Historical Provider, MD  fluticasone (FLONASE) 50 MCG/ACT nasal spray Place 2 sprays into both nostrils daily as needed for rhinitis.    Historical Provider, MD  Fluticasone-Salmeterol (ADVAIR) 250-50 MCG/DOSE AEPB Inhale 1 puff into the lungs 2 (two) times daily.    Historical Provider, MD  insulin aspart (NOVOLOG FLEXPEN) 100 UNIT/ML FlexPen Inject 10 Units into the skin 3 (three) times daily before meals.    Historical Provider, MD  insulin glargine (LANTUS) 100 unit/mL SOPN Inject 0.2 mLs (20 Units total) into the skin at bedtime. Patient taking differently: Inject 72 Units into the skin at bedtime.   12/21/15   Katha Hamming, MD  lisinopril (PRINIVIL,ZESTRIL) 2.5 MG tablet Take 2.5 mg by mouth daily.    Historical Provider, MD  magnesium oxide (MAG-OX) 400 (241.3 Mg) MG tablet Take 400 mg by mouth at bedtime.    Historical Provider, MD  traZODone (DESYREL) 100 MG tablet Take 200 mg by mouth at bedtime.     Historical Provider, MD   VITAL SIGNS:  Blood pressure (!) 171/98, pulse 83, temperature 98 F (36.7 C), temperature source Oral, resp. rate (!) 23, height 5\' 3"  (1.6 m), weight 72.6 kg (160 lb), SpO2 97 %. PHYSICAL EXAMINATION:  Physical Exam  GENERAL:  55 y.o.-year-old patient lying in the bed with no acute distress.  EYES: Pupils equal, round, reactive to light and accommodation. No scleral icterus. Extraocular muscles intact.  HEENT: Head atraumatic, normocephalic. Oropharynx and nasopharynx clear.  NECK:  Supple, no jugular venous distention. No thyroid enlargement, no  tenderness.  LUNGS: Normal breath sounds bilaterally, no wheezing, rales,rhonchi or crepitation. No use of accessory muscles of respiration.  CARDIOVASCULAR: S1, S2 normal. No murmurs, rubs, or gallops.  ABDOMEN: Soft, nontender, nondistended. Bowel sounds present. No organomegaly or mass.  EXTREMITIES: No pedal edema, cyanosis, or clubbing.  NEUROLOGIC: Cranial nerves II through XII are intact. Muscle strength 5/5 in all extremities. Sensation intact. Gait not checked.  PSYCHIATRIC: The patient is alert and oriented x 3.  SKIN: No obvious rash, lesion, or ulcer.  LABORATORY PANEL:   CBC  Recent Labs Lab 01/09/16 1244  WBC 5.7  HGB 11.9*  HCT 35.4  PLT 224   ------------------------------------------------------------------------------------------------------------------  Chemistries   Recent Labs Lab 01/09/16 1244  NA 138  K 4.3  CL 102  CO2 27  GLUCOSE 453*  BUN 22*  CREATININE 1.19*  CALCIUM 8.9    ------------------------------------------------------------------------------------------------------------------  Cardiac Enzymes  Recent Labs Lab 01/09/16 1244  TROPONINI <0.03   ------------------------------------------------------------------------------------------------------------------  RADIOLOGY:  Dg Chest Portable 1 View  Result Date: 01/09/2016 CLINICAL DATA:  CT Diabetes.  Hypertension.  COPD. EXAM: PORTABLE CHEST 1 VIEW COMPARISON:  07/22/2015 FINDINGS: Numerous leads and wires project over the chest. Lower cervical spine fixation. Patient rotated right. Normal heart size. No pleural effusion or pneumothorax. Vague increased density projecting over the left lung base is favored to be due to overlying breast tissue. Right lung clear. IMPRESSION: No acute cardiopulmonary disease. Electronically Signed   By: Jeronimo GreavesKyle  Talbot M.D.   On: 01/09/2016 13:33   IMPRESSION AND PLAN:  55 year old female with history of diabetes, hypertension, anxiety, and stroke is being admitted for possible new stroke  * Acute CVA: - stroke order set, MRI of the brain and carotid Dopplers, echo - Aspirin - Neurology consultation.  Case discussed with Dr. Thad Rangereynolds by Dr. Fanny BienQuale, She will see patient in the hospital.  * COPD on 3 L oxygen - Stable.  Continue home inhalers  * Diabetes - Check hemoglobin A1c - Insulin Lantus and start sliding scale  * Hypertension - Let it auto regulate in acute CVA phase with a goal systolic pressure 160-180 - Continue lisinopril and adjust as needed     All the records are reviewed and case discussed with ED provider. Management plans discussed with the patient, family and they are in agreement.  CODE STATUS: Full code  TOTAL TIME TAKING CARE OF THIS PATIENT: 45 minutes.    Delfino LovettVipul Liev Brockbank M.D on 01/09/2016 at 3:37 PM  Between 7am to 6pm - Pager - [phone]  After 6pm go to [email or web address] - Social research officer, governmentpassword [email or web address]  [facility]   Office  [phone]  CC: Primary care physician; Nancy NordmannALVIN C POWELL JR, MD   Note: This dictation was prepared with Dragon dictation along with smaller phrase technology. Any transcriptional errors that result from this process are unintentional.

## 2016-01-10 ENCOUNTER — Inpatient Hospital Stay: Payer: Medicaid Other

## 2016-01-10 DIAGNOSIS — R29898 Other symptoms and signs involving the musculoskeletal system: Secondary | ICD-10-CM

## 2016-01-10 LAB — GLUCOSE, CAPILLARY
GLUCOSE-CAPILLARY: 241 mg/dL — AB (ref 65–99)
Glucose-Capillary: 353 mg/dL — ABNORMAL HIGH (ref 65–99)
Glucose-Capillary: 155 mg/dL — ABNORMAL HIGH (ref 65–99)

## 2016-01-10 LAB — LIPID PANEL
Cholesterol: 184 mg/dL (ref 0–200)
HDL: 32 mg/dL — AB (ref >40)
LDL Cholesterol: UNDETERMINED mg/dL (ref 0–99)
Total CHOL/HDL Ratio: 5.8 RATIO
Triglycerides: 527 mg/dL — ABNORMAL HIGH (ref <150)
VLDL: UNDETERMINED mg/dL (ref 0–40)

## 2016-01-10 LAB — ECHOCARDIOGRAM COMPLETE
HEIGHTINCHES: 63 in
WEIGHTICAEL: 2560 [oz_av]

## 2016-01-10 MED ORDER — FENOFIBRATE 160 MG PO TABS
160.0000 mg | ORAL_TABLET | Freq: Every day | ORAL | Status: DC
  Administered 2016-01-10 – 2016-01-11 (×2): 160 mg via ORAL
  Filled 2016-01-10 (×2): qty 1

## 2016-01-10 MED ORDER — INSULIN ASPART 100 UNIT/ML ~~LOC~~ SOLN
0.0000 [IU] | Freq: Three times a day (TID) | SUBCUTANEOUS | Status: DC
  Administered 2016-01-10: 9 [IU] via SUBCUTANEOUS
  Administered 2016-01-11 (×2): 3 [IU] via SUBCUTANEOUS
  Filled 2016-01-10: qty 3
  Filled 2016-01-10: qty 9
  Filled 2016-01-10: qty 3

## 2016-01-10 MED ORDER — INSULIN GLARGINE 100 UNIT/ML ~~LOC~~ SOLN
30.0000 [IU] | Freq: Every day | SUBCUTANEOUS | Status: DC
  Administered 2016-01-10: 30 [IU] via SUBCUTANEOUS
  Filled 2016-01-10 (×2): qty 0.3

## 2016-01-10 MED ORDER — INSULIN ASPART 100 UNIT/ML ~~LOC~~ SOLN
10.0000 [IU] | Freq: Three times a day (TID) | SUBCUTANEOUS | Status: DC
  Administered 2016-01-10 – 2016-01-11 (×4): 10 [IU] via SUBCUTANEOUS
  Filled 2016-01-10 (×4): qty 10

## 2016-01-10 MED ORDER — GADOBENATE DIMEGLUMINE 529 MG/ML IV SOLN
15.0000 mL | Freq: Once | INTRAVENOUS | Status: AC | PRN
  Administered 2016-01-10: 14:00:00 15 mL via INTRAVENOUS

## 2016-01-10 MED ORDER — AMITRIPTYLINE HCL 25 MG PO TABS
25.0000 mg | ORAL_TABLET | Freq: Two times a day (BID) | ORAL | Status: DC
  Administered 2016-01-10 – 2016-01-11 (×2): 25 mg via ORAL
  Filled 2016-01-10 (×2): qty 1

## 2016-01-10 MED ORDER — OXYCODONE-ACETAMINOPHEN 7.5-325 MG PO TABS: 1.0000 | ORAL_TABLET | Freq: Four times a day (QID) | ORAL | Status: DC

## 2016-01-10 MED ORDER — OXYCODONE-ACETAMINOPHEN 7.5-325 MG PO TABS
1.0000 | ORAL_TABLET | Freq: Four times a day (QID) | ORAL | Status: DC | PRN
  Administered 2016-01-10 – 2016-01-11 (×3): 1 via ORAL
  Filled 2016-01-10 (×3): qty 1

## 2016-01-10 MED ORDER — TRAZODONE HCL 50 MG PO TABS
150.0000 mg | ORAL_TABLET | Freq: Every day | ORAL | Status: DC
  Administered 2016-01-10: 23:00:00 150 mg via ORAL
  Filled 2016-01-10: qty 1

## 2016-01-10 NOTE — Progress Notes (Signed)
Inpatient Diabetes Program Recommendations  AACE/ADA: New Consensus Statement on Inpatient Glycemic Control (2015)  Target Ranges:  Prepandial:   less than 140 mg/dL      Peak postprandial:   less than 180 mg/dL (1-2 hours)      Critically ill patients:  140 - 180 mg/dL   Lab Results  Component Value Date   GLUCAP 311 (H) 01/09/2016   HGBA1C 10.5 (H) 12/20/2015    Review of Glycemic Control  Results for [name], Doni W (MRN [id number]) as of 01/10/2016 10:14  Ref. Range 01/09/2016 12:35 01/09/2016 21:39  Glucose-Capillary Latest Ref Range: 65 - 99 mg/dL 409402 (H) 811311 (H)    Diabetes history: Type 2, patient of [facility]ell[facility]  Outpatient Diabetes medications: Lantus 72 units qhs, Novolog 21 units tid with meals- confirmed with patient at the bedside  Current orders for Inpatient glycemic control: Lantus 20 units qhs, Novolog 10 units tid  Inpatient Diabetes Program Recommendations: please change diet to heart healthy carb modified.   Last admission on 12/2015, patient confirmed that she is taking Lantus 72 units qhs and Novolog 10 units tid. [facility] state the patient should be taking Lantus 36 units bid.  Today the patient confirms she takes 72 units all at once and gives it in her hips.  She tells me she is taking Novolog 21 units tid but if she skips a meal (breakfast or lunch) she always eats a snack and if she eats a snack, she takes Novolog 10 units.   Re-educated about using her abdomen for insulin administration and to rotate the site where she gives the insulin- encouraged to use the entire abdomen for insulin administration.   Patient has a follow up appointment with MD on 01/14/16- encouraged to discuss insulin doses and recent admission.   Susette RacerJulie Tran Arzuaga, RN, BA, MHA, CDE Diabetes Coordinator Inpatient Diabetes Program  [phone] (Team Pager) [phone] [facility] Office) 01/10/2016 11:54 AM

## 2016-01-10 NOTE — Progress Notes (Addendum)
SLP Cancellation Note  Patient Details Name: [name] MRN: [id number] DOB: [birth year]   Cancelled treatment:       Reason Eval/Treat Not Completed: SLP screened, no needs identified, will sign off (reviewed chart notes; consulted MD/NSG then patient). NSG and pt denied any swallowing deficits w/ meals/meds; tolerating each adequately this admission. Pt is communicating w/ NSG and staff appropriately w/ min slower speech, however, NSG feels this may be d/t many medications pt is taking which have a sedating effect; MD is addressing some of the medications.  NSG to reconsult if any decline in status is noted. Encourage pt to f/u w/ her primary MD Outpatient for a ST referral Outpatient if speech fluency is slow(not at her baseline) post not taking the multiple, sedating medications.  Recommend general aspiration precautions during admission. NSG agreed.    Jerilynn SomKatherine Graviel Payeur, MS, CCC-SLP [name] 01/10/2016, 12:45 PM

## 2016-01-10 NOTE — Consult Note (Signed)
Reason for Consult:Increased falls Referring Physician: Allena KatzPatel   CC: Increased falls  HPI: [name] is an 55 y.o. female with a history of stroke recently discharged on 11/17 with acute infarct to home with outpatient therapy.  Was admitted for increased falls at that time.  Patient reports that she was ambulating without a walker at that time but on review of records patient ambulation was limited to about 60 feet with a rolling walker/poor safety awareness and SNF placement was recommended by PT.  Patient reports that she sis not initiate outpatient therapy and has had what she describes as a progressive decline since that time.  She reports that she was falling more and more and was concerned that she may be having another stroke.  Although she reports being compliant wit her medications, this does not seem to be the case.  Was [facility] but left AMA because she did not like how she was being treated.   Patient reports being on disability for back pain.  Reports bowel and bladder incontinence for the past 1-2 weeks.    Past Medical History:  Diagnosis Date  . Anxiety   . Asthma   . Coarse tremors   . COPD (chronic obstructive pulmonary disease) (HCC)    on 3L home o2  . CVA (cerebral vascular accident) (HCC)   . 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 Hyperlipidemia   . Hypertension   . Insomnia     Past Surgical History:  Procedure Laterality Date  . BACK SURGERY     x 2 lumbar spine  . BRONCHOSCOPY    . CERVICAL SPINE SURGERY    . CHOLECYSTECTOMY    . FOOT SURGERY      Family History  Problem Relation Age of Onset  . CAD Mother   . Hypertension Mother   . Throat cancer Father     Social History:  reports that she has been smoking Cigarettes.  She has been smoking about 0.50 packs per day. She has never used smokeless tobacco. She reports that she does not drink alcohol or use drugs.  Allergies  Allergen Reactions  . Lyrica  [Pregabalin] Other (See Comments)    hallucination  . Penicillins   . Prozac [Fluoxetine Hcl] Other (See Comments)    hallucinations  . Vicodin [Hydrocodone-Acetaminophen] Rash    Medications:  I have reviewed the patient's current medications. Prior to Admission:  Prescriptions Prior to Admission  Medication Sig Dispense Refill Last Dose  . albuterol (PROVENTIL) (2.5 MG/3ML) 0.083% nebulizer solution Take 2.5 mg by nebulization every 6 (six) hours as needed for wheezing or shortness of breath.   Past Week at Unknown time  . albuterol-ipratropium (COMBIVENT) 18-103 MCG/ACT inhaler Inhale 1 puff into the lungs every 6 (six) hours as needed for wheezing or shortness of breath.   01/09/2016 at Unknown time  . ALPRAZolam (XANAX) 1 MG tablet Take 1 mg by mouth 4 (four) times daily as needed for anxiety.    01/09/2016 at Unknown time  . amitriptyline (ELAVIL) 25 MG tablet Take 25 mg by mouth 3 (three) times daily.   01/08/2016 at Unknown time  . amLODipine (NORVASC) 10 MG tablet Take 10 mg by mouth daily.   01/08/2016 at Unknown time  . aspirin EC 81 MG tablet Take by mouth daily.   01/08/2016 at Unknown time  . atorvastatin (LIPITOR) 40 MG tablet Take 40 mg by mouth at bedtime.   01/08/2016 at Unknown  time  . fluticasone (FLONASE) 50 MCG/ACT nasal spray Place 2 sprays into both nostrils daily as needed for rhinitis.   01/08/2016 at Unknown time  . Fluticasone-Salmeterol (ADVAIR) 250-50 MCG/DOSE AEPB Inhale 1 puff into the lungs 2 (two) times daily.   Past Month at Unknown time  . gabapentin (NEURONTIN) 300 MG capsule Take 2 capsules (600 mg total) by mouth 2 (two) times daily. (Patient taking differently: Take 600 mg by mouth 3 (three) times daily. ) 60 capsule 0 01/08/2016 at Unknown time  . insulin aspart (NOVOLOG) 100 UNIT/ML injection Inject 0-5 Units into the skin at bedtime. (Patient taking differently: Inject 21 Units into the skin 3 (three) times daily with meals. ) 10 mL 11 01/08/2016 at Unknown  time  . magnesium oxide (MAG-OX) 400 (241.3 Mg) MG tablet Take 400 mg by mouth 2 (two) times daily.    01/08/2016 at Unknown time  . Melatonin 3 MG TABS Take 1 tablet by mouth at bedtime.   01/08/2016 at Unknown time  . metoprolol tartrate (LOPRESSOR) 25 MG tablet Take 25 mg by mouth 2 (two) times daily.   01/08/2016 at Unknown time  . oxyCODONE-acetaminophen (PERCOCET) 7.5-325 MG tablet Take 1 tablet by mouth every 4 (four) hours as needed for severe pain. Every 4-6 hours   01/08/2016 at Unknown time  . tiotropium (SPIRIVA) 18 MCG inhalation capsule Place 18 mcg into inhaler and inhale daily.   01/09/2016 at Unknown time  . traZODone (DESYREL) 100 MG tablet Take 100 mg by mouth at bedtime as needed for sleep.    01/08/2016 at Unknown time  . vortioxetine HBr (TRINTELLIX) 10 MG TABS Take 1 tablet by mouth daily.     Marland Kitchen zolpidem (AMBIEN) 10 MG tablet Take 10 mg by mouth at bedtime as needed for sleep.   Past Month at Unknown time  . aspirin EC 325 MG EC tablet Take 1 tablet (325 mg total) by mouth daily. (Patient not taking: Reported on 01/09/2016) 30 tablet 0 Not Taking at Unknown time  . insulin glargine (LANTUS) 100 unit/mL SOPN Inject 0.2 mLs (20 Units total) into the skin at bedtime. (Patient taking differently: Inject 72 Units into the skin at bedtime. ) 15 mL 11 01/07/2016  . lisinopril (PRINIVIL,ZESTRIL) 2.5 MG tablet Take 2.5 mg by mouth daily.   12/19/2015 at am   Scheduled: . amitriptyline  25 mg Oral TID  . amLODipine  10 mg Oral Daily  . aspirin  300 mg Rectal Daily   Or  . aspirin  325 mg Oral Daily  . atorvastatin  40 mg Oral QHS  . enoxaparin (LOVENOX) injection  40 mg Subcutaneous Q24H  . gabapentin  600 mg Oral BID  . insulin aspart  10 Units Subcutaneous TID AC  . insulin glargine  20 Units Subcutaneous QHS  . lisinopril  2.5 mg Oral Daily  . magnesium oxide  400 mg Oral QHS  . Melatonin  5 mg Oral QHS  . metoprolol tartrate  25 mg Oral BID  . mometasone-formoterol  2 puff  Inhalation BID  . tiotropium  18 mcg Inhalation Daily  . traZODone  200 mg Oral QHS  . vortioxetine HBr  1 tablet Oral Daily    ROS: History obtained from the patient  General ROS: negative for - chills, fatigue, fever, night sweats, weight gain or weight loss Psychological ROS: negative for - behavioral disorder, hallucinations, memory difficulties, mood swings or suicidal ideation Ophthalmic ROS: negative for - blurry vision, double vision, eye  pain or loss of vision ENT ROS: negative for - epistaxis, nasal discharge, oral lesions, sore throat, tinnitus or vertigo Allergy and Immunology ROS: negative for - hives or itchy/watery eyes Hematological and Lymphatic ROS: negative for - bleeding problems, bruising or swollen lymph nodes Endocrine ROS: negative for - galactorrhea, hair pattern changes, polydipsia/polyuria or temperature intolerance Respiratory ROS: negative for - cough, hemoptysis, shortness of breath or wheezing Cardiovascular ROS: negative for - chest pain, dyspnea on exertion, edema or irregular heartbeat Gastrointestinal ROS: as noted in HPI Genito-Urinary ROS: as noted in HPI Musculoskeletal ROS: negative for - joint swelling or muscular weakness Neurological ROS: as noted in HPI Dermatological ROS: negative for rash and skin lesion changes  Physical Examination: Blood pressure (!) 127/57, pulse 69, temperature 98.5 F (36.9 C), resp. rate 18, height 5\' 3"  (1.6 m), weight 72.6 kg (160 lb), SpO2 98 %.  Gen-NAD, poor hygiene HEENT-  Normocephalic, no lesions, without obvious abnormality.  Bruising around right eye.  Normal TM's bilaterally.  Normal auditory canals and external ears. Normal external nose, mucus membranes and septum.  Normal pharynx. Cardiovascular- S1, S2 normal, pulses palpable throughout   Lungs- chest clear, no wheezing, rales, normal symmetric air entry Abdomen- soft, non-tender; bowel sounds normal; no masses,  no organomegaly Extremities- no  edema Lymph-no adenopathy palpable Musculoskeletal-no joint tenderness, deformity or swelling Skin-warm and dry, no hyperpigmentation, vitiligo, or suspicious lesions  Neurological Examination Mental Status: Alert but groggy, oriented, thought content appropriate.  Speech fluent without evidence of aphasia.  Mild dysarthria.  Able to follow 3 step commands without difficulty. Cranial Nerves: II: Discs flat bilaterally; Visual fields grossly normal, pupils equal, round, reactive to light and accommodation III,IV, VI: right ptosis, extra-ocular motions intact bilaterally V,VII: smile symmetric, facial light touch sensation normal bilaterally VIII: hearing normal bilaterally IX,X: gag reflex present XI: bilateral shoulder shrug XII: midline tongue extension Motor: Right : Upper extremity   5/5    Left:     Upper extremity   5/5  Lower extremity   4/5     Lower extremity   4/5 Tone and bulk:normal tone throughout; no atrophy noted Sensory: Pinprick and light touch intact throughout, bilaterally Deep Tendon Reflexes: 2+ and symmetric with absent AJ's bilaterally Plantars: Right: mute   Left: mute Cerebellar: Normal finger-to-nose and normal heel-to-shin testing bilaterally Gait: not tested due to safety concerns   Laboratory Studies:   Basic Metabolic Panel:  Recent Labs Lab 01/09/16 1244  NA 138  K 4.3  CL 102  CO2 27  GLUCOSE 453*  BUN 22*  CREATININE 1.19*  CALCIUM 8.9    Liver Function Tests: No results for input(s): AST, ALT, ALKPHOS, BILITOT, PROT, ALBUMIN in the last 168 hours. No results for input(s): LIPASE, AMYLASE in the last 168 hours. No results for input(s): AMMONIA in the last 168 hours.  CBC:  Recent Labs Lab 01/09/16 1244  WBC 5.7  HGB 11.9*  HCT 35.4  MCV 88.1  PLT 224    Cardiac Enzymes:  Recent Labs Lab 01/09/16 1244  TROPONINI <0.03    BNP: Invalid input(s): POCBNP  CBG:  Recent Labs Lab 01/09/16 1235 01/09/16 2139  01/10/16 1021  GLUCAP 402* 311* 241*    Microbiology: Results for orders placed or performed during the hospital encounter of 07/17/15  Urine culture     Status: Abnormal   Collection Time: 07/17/15  4:16 PM  Result Value Ref Range Status   Specimen Description URINE, CLEAN CATCH  Final  Special Requests NONE  Final   Culture >=100,000 COLONIES/mL ESCHERICHIA COLI (A)  Final   Report Status 07/20/2015 FINAL  Final   Organism ID, Bacteria ESCHERICHIA COLI (A)  Final      Susceptibility   Escherichia coli - MIC*    AMPICILLIN 4 SENSITIVE Sensitive     CEFAZOLIN <=4 SENSITIVE Sensitive     CEFTRIAXONE <=1 SENSITIVE Sensitive     CIPROFLOXACIN <=0.25 SENSITIVE Sensitive     GENTAMICIN <=1 SENSITIVE Sensitive     IMIPENEM <=0.25 SENSITIVE Sensitive     NITROFURANTOIN <=16 SENSITIVE Sensitive     TRIMETH/SULFA <=20 SENSITIVE Sensitive     AMPICILLIN/SULBACTAM 4 SENSITIVE Sensitive     PIP/TAZO <=4 SENSITIVE Sensitive     Extended ESBL NEGATIVE Sensitive     * >=100,000 COLONIES/mL ESCHERICHIA COLI  MRSA PCR Screening     Status: None   Collection Time: 07/18/15  7:44 AM  Result Value Ref Range Status   MRSA by PCR NEGATIVE NEGATIVE Final    Comment:        The GeneXpert MRSA Assay (FDA approved for NASAL specimens only), is one component of a comprehensive MRSA colonization surveillance program. It is not intended to diagnose MRSA infection nor to guide or monitor treatment for MRSA infections.   Culture, respiratory (NON-Expectorated)     Status: None   Collection Time: 07/18/15  9:25 AM  Result Value Ref Range Status   Specimen Description TRACHEAL ASPIRATE  Final   Special Requests Normal  Final   Gram Stain   Final    ABUNDANT WBC PRESENT,BOTH PMN AND MONONUCLEAR NO ORGANISMS SEEN    Culture   Final    Consistent with normal respiratory flora. Performed at [facility]    Report Status 07/20/2015 FINAL  Final    Coagulation Studies: No results for  input(s): LABPROT, INR in the last 72 hours.  Urinalysis: No results for input(s): COLORURINE, LABSPEC, PHURINE, GLUCOSEU, HGBUR, BILIRUBINUR, KETONESUR, PROTEINUR, UROBILINOGEN, NITRITE, LEUKOCYTESUR in the last 168 hours.  Invalid input(s): APPERANCEUR  Lipid Panel:     Component Value Date/Time   CHOL 184 01/10/2016 0545   TRIG 527 (H) 01/10/2016 0545   HDL 32 (L) 01/10/2016 0545   CHOLHDL 5.8 01/10/2016 0545   VLDL UNABLE TO CALCULATE IF TRIGLYCERIDE OVER 400 mg/dL 16/11/9602 5409   LDLCALC UNABLE TO CALCULATE IF TRIGLYCERIDE OVER 400 mg/dL 81/19/1478 2956    OZHY8M:  Lab Results  Component Value Date   HGBA1C 10.5 (H) 12/20/2015    Urine Drug Screen:     Component Value Date/Time   LABOPIA NONE DETECTED 01/09/2016 1326   COCAINSCRNUR NONE DETECTED 01/09/2016 1326   LABBENZ POSITIVE (A) 01/09/2016 1326   AMPHETMU NONE DETECTED 01/09/2016 1326   THCU NONE DETECTED 01/09/2016 1326   LABBARB NONE DETECTED 01/09/2016 1326    Alcohol Level: No results for input(s): ETH in the last 168 hours.   Imaging: Dg Chest 2 View  Result Date: 01/10/2016 CLINICAL DATA:  55 year old with current history asthma, COPD, diabetes and hypertension, admitted yesterday with multiple recent falls. EXAM: CHEST  2 VIEW COMPARISON:  01/09/2016, 07/22/2015 and earlier, including CT chest 09/07/2013. FINDINGS: AP sitting view erect and lateral images were obtained. Cardiomediastinal silhouette unremarkable, unchanged. Small right pleural effusion. Lungs clear. Bronchovascular markings normal. Pulmonary vascularity normal. Degenerative changes involving the thoracic spine. IMPRESSION: Small right pleural effusion. No acute cardiopulmonary disease otherwise. Electronically Signed   By: Hulan Saas M.D.   On: 01/10/2016  09:27   Mr Brain Wo Contrast  Result Date: 01/10/2016 CLINICAL DATA:  Recent CVA.  New onset of multiple falls. EXAM: MRI HEAD WITHOUT CONTRAST MRA HEAD WITHOUT CONTRAST TECHNIQUE:  Multiplanar, multiecho pulse sequences of the brain and surrounding structures were obtained without intravenous contrast. Angiographic images of the head were obtained using MRA technique without contrast. COMPARISON:  12/20/2015 MRA 12/19/2015 brain MRI FINDINGS: MRI HEAD FINDINGS Brain: No acute infarction, hemorrhage, hydrocephalus, extra-axial collection or mass lesion. Resolved restricted diffusion at the right external capsule infarct described previously. Remote lacunar infarct in the left internal capsule/lateral thalamus. Mild microvascular ischemic gliosis in the periventricular white matter. Normal brain volume. No hemorrhage, hydrocephalus, or mass. Stable mild deformation of the cervicomedullary junction from tortuous vertebral arteries. Vascular: Arterial findings described below. Normal dural venous sinus flow voids. Skull and upper cervical spine: Partially visualized postoperative cervical spine. No evidence of focal marrow lesion. Sinuses/Orbits: Negative MRA HEAD FINDINGS Symmetric carotid and vertebral arteries. Vertebral tortuosity at the V4 segment. No major branch occlusion or flow limiting stenosis. Minimal inferior bulge from the left supraclinoid ICA, measuring 1 to 2 mm, likely a infundibulum based on conical shape on source images. There is scattered overall mild luminal undulation of bilateral MCA and PCA branches which is predominately attributed artifact when compared to prior, although there may also be atheromatous changes. No treatable flow limiting stenosis. IMPRESSION: 1. No acute or interval finding when compared to brain MRI and MRA 12/2015. 2. Remote lacunar infarcts and mild periventricular microvascular ischemic gliosis. 3. 1 to 2 mm left supraclinoid ICA outpouching, favor infundibulum over aneurysm. Electronically Signed   By: Marnee SpringJonathon  Watts M.D.   On: 01/10/2016 09:38   [place] Carotid Bilateral (at [facility] And [facility] Only)  Result Date: 01/10/2016 CLINICAL DATA:  Fall. EXAM:  BILATERAL CAROTID DUPLEX ULTRASOUND TECHNIQUE: Wallace CullensGray scale imaging, color Doppler and duplex ultrasound were performed of bilateral carotid and vertebral arteries in the neck. COMPARISON:  MRI 01/10/2016.  CT 12/19/2015. FINDINGS: Criteria: Quantification of carotid stenosis is based on velocity parameters that correlate the residual internal carotid diameter with NASCET-based stenosis levels, using the diameter of the distal internal carotid lumen as the denominator for stenosis measurement. The following velocity measurements were obtained: RIGHT ICA:  77/21 cm/sec CCA:  86/14 cm/sec SYSTOLIC ICA/CCA RATIO:  0.9 DIASTOLIC ICA/CCA RATIO:  1.5 ECA:  129 cm/sec LEFT ICA:  75/22 cm/sec CCA:  90/17 cm/sec SYSTOLIC ICA/CCA RATIO:  0.8 DIASTOLIC ICA/CCA RATIO:  1.3 ECA:  91 cm/sec RIGHT CAROTID ARTERY: Mild right carotid bifurcation and proximal ICA atherosclerotic vascular disease. No flow limiting stenosis. RIGHT VERTEBRAL ARTERY:  Patent with antegrade flow. LEFT CAROTID ARTERY: Mild left carotid bifurcation atherosclerotic vascular disease. No flow limiting stenosis. LEFT VERTEBRAL ARTERY:  Patent with antegrade flow. IMPRESSION: 1. Mild right carotid bifurcation proximal ICA atherosclerotic vascular disease. Mild left carotid bifurcation atherosclerotic vascular disease. No flow limiting stenosis. Degree of stenosis less than 50% bilaterally. 2. Vertebral arteries are patent with antegrade flow. Electronically Signed   By: Maisie Fushomas  Register   On: 01/10/2016 10:10   Dg Chest Portable 1 View  Result Date: 01/09/2016 CLINICAL DATA:  CT Diabetes.  Hypertension.  COPD. EXAM: PORTABLE CHEST 1 VIEW COMPARISON:  07/22/2015 FINDINGS: Numerous leads and wires project over the chest. Lower cervical spine fixation. Patient rotated right. Normal heart size. No pleural effusion or pneumothorax. Vague increased density projecting over the left lung base is favored to be due to overlying breast tissue. Right lung clear.  IMPRESSION: No acute cardiopulmonary disease. Electronically Signed   By: Jeronimo Greaves M.D.   On: 01/09/2016 13:33   Mr Maxine Glenn Head/brain BJ Cm  Result Date: 01/10/2016 CLINICAL DATA:  Recent CVA.  New onset of multiple falls. EXAM: MRI HEAD WITHOUT CONTRAST MRA HEAD WITHOUT CONTRAST TECHNIQUE: Multiplanar, multiecho pulse sequences of the brain and surrounding structures were obtained without intravenous contrast. Angiographic images of the head were obtained using MRA technique without contrast. COMPARISON:  12/20/2015 MRA 12/19/2015 brain MRI FINDINGS: MRI HEAD FINDINGS Brain: No acute infarction, hemorrhage, hydrocephalus, extra-axial collection or mass lesion. Resolved restricted diffusion at the right external capsule infarct described previously. Remote lacunar infarct in the left internal capsule/lateral thalamus. Mild microvascular ischemic gliosis in the periventricular white matter. Normal brain volume. No hemorrhage, hydrocephalus, or mass. Stable mild deformation of the cervicomedullary junction from tortuous vertebral arteries. Vascular: Arterial findings described below. Normal dural venous sinus flow voids. Skull and upper cervical spine: Partially visualized postoperative cervical spine. No evidence of focal marrow lesion. Sinuses/Orbits: Negative MRA HEAD FINDINGS Symmetric carotid and vertebral arteries. Vertebral tortuosity at the V4 segment. No major branch occlusion or flow limiting stenosis. Minimal inferior bulge from the left supraclinoid ICA, measuring 1 to 2 mm, likely a infundibulum based on conical shape on source images. There is scattered overall mild luminal undulation of bilateral MCA and PCA branches which is predominately attributed artifact when compared to prior, although there may also be atheromatous changes. No treatable flow limiting stenosis. IMPRESSION: 1. No acute or interval finding when compared to brain MRI and MRA 12/2015. 2. Remote lacunar infarcts and mild  periventricular microvascular ischemic gliosis. 3. 1 to 2 mm left supraclinoid ICA outpouching, favor infundibulum over aneurysm. Electronically Signed   By: Marnee Spring M.D.   On: 01/10/2016 09:38     Assessment/Plan: 55 year old female presenting with frequent falls.  Was seen at [facility] recently for the same.  Head CT preformed there showed concern for an acute infarct.  Patient was to be admitted but signed out.  Patient now presents here.  MRI of the brain reviewed and shows no acute changes.  Suspect decline is secondary to patient not having therapy since discharge.  Do not suspect she has been fully compliant with medications either.  She also has complaints of bowel and bladder incontinence.  Unclear if this is related to inability to ambulate safety or a cord issue.  Patient has had surgery in the past and now with frequent falls.  Further work up recommended.    Recommendations: 1.  MRI of the cervical and lumbar spine 2.  Continue ASA, statin and blood sugar control.   3.  PT consult 4.  Concerned about degree of sedating medications.  Will adjust pain medications, Elavil and Trazodone.  Ambien to be discontinued.     Thana Farr, MD Neurology [phone] 01/10/2016, 10:48 AM

## 2016-01-10 NOTE — Evaluation (Signed)
Physical Therapy Evaluation Patient Details Name: [name] MRN: [id number] DOB: 08-Mar-1960 Today's Date: 01/10/2016   History of Present Illness  Admitted for hyperglycemia and new R side weakness. Pt with history of multiple falls at home over the past few days. Recent admission for CVA. PMH includes DM, anxiety, HTN, and COPD (wears 3L of O2 at baseline). Pt with negative MRI at this time.  Clinical Impression  Pt is a pleasant 55 year old female who was admitted for complaints of R side weakness along with multiple falls. Negative imaging for CVA. Pt performs bed mobility with supervision, transfers with cga, and ambulation with cga and RW. Pt demonstrates deficits with balance/strength/mobility. Negative coordination/sensation testing at this time on R LE. Slight weakness observed in R LE more than L LE. Pt very motivated to work with therapy and wants to continue to walk. Recommend walking twice/day with RN staff in addition to therapy intervention to maintain endurance and functional strength. RN notified. Would benefit from skilled PT to address above deficits and promote optimal return to PLOF. Recommend transition to HHPT upon discharge from acute hospitalization.       Follow Up Recommendations Home health PT;Supervision for mobility/OOB    Equipment Recommendations       Recommendations for Other Services       Precautions / Restrictions Precautions Precautions: Fall Restrictions Weight Bearing Restrictions: No      Mobility  Bed Mobility Overal bed mobility: Needs Assistance Bed Mobility: Supine to Sit     Supine to sit: Supervision;HOB elevated     General bed mobility comments: Supervision given, however no cues necessary. Once seated at EOB, pt with R lateral leaning, needing cues for self correction  Transfers Overall transfer level: Needs assistance Equipment used: Rolling walker (2 wheeled) Transfers: Sit to/from Stand Sit to Stand: Min guard         General transfer comment: safe technique performed with RW. Upright posture noted  Ambulation/Gait Ambulation/Gait assistance: Min guard Ambulation Distance (Feet): 200 Feet Assistive device: Rolling walker (2 wheeled) Gait Pattern/deviations: Step-through pattern     General Gait Details: ambulated using RW and slow gait speed. Able to carry conversation during ambulation, however does demonstrate B knee flexion and short step length. Needs cues for obstacle avoidance at times and attention to task.  Stairs            Wheelchair Mobility    Modified Rankin (Stroke Patients Only)       Balance Overall balance assessment: History of Falls;Needs assistance Sitting-balance support: Bilateral upper extremity supported;Feet supported Sitting balance-Leahy Scale: Fair     Standing balance support: Bilateral upper extremity supported Standing balance-Leahy Scale: Fair                               Pertinent Vitals/Pain Pain Assessment: Faces Faces Pain Scale: Hurts a little bit Pain Location: chronic LBP Pain Descriptors / Indicators: Aching;Constant Pain Intervention(s): Limited activity within patient's tolerance;Patient requesting pain meds-RN notified;RN gave pain meds during session;Repositioned    Home Living Family/patient expects to be discharged to:: Private residence Living Arrangements: Children Available Help at Discharge: Family;Available 24 hours/day Type of Home: House Home Access: Level entry     Home Layout: One level Home Equipment: Walker - 2 wheels;Bedside commode;Shower seat;Cane - single point;Wheelchair - manual      Prior Function Level of Independence: Independent with assistive device(s)  Hand Dominance        Extremity/Trunk Assessment   Upper Extremity Assessment: Overall WFL for tasks assessed           Lower Extremity Assessment: Generalized weakness (R LE grossly 4/5; L LE grossly  5/5)         Communication   Communication: No difficulties  Cognition Arousal/Alertness: Awake/alert Behavior During Therapy: Flat affect Overall Cognitive Status: Within Functional Limits for tasks assessed                      General Comments      Exercises Other Exercises Other Exercises: Seated B LE alt marching, hip abd/add, and quad sets. All ther-ex performed x 10 reps with cga and cues for correct technique.   Assessment/Plan    PT Assessment Patient needs continued PT services  PT Problem List Decreased strength;Decreased activity tolerance;Decreased balance;Decreased mobility;Decreased safety awareness;Pain          PT Treatment Interventions DME instruction;Gait training;Functional mobility training;Therapeutic activities;Therapeutic exercise;Balance training;Patient/family education;Neuromuscular re-education    PT Goals (Current goals can be found in the Care Plan section)  Acute Rehab PT Goals Patient Stated Goal: to be able to walk more PT Goal Formulation: With patient Time For Goal Achievement: 01/24/16 Potential to Achieve Goals: Good    Frequency Min 2X/week   Barriers to discharge        Co-evaluation               End of Session Equipment Utilized During Treatment: Gait belt Activity Tolerance: Patient tolerated treatment well Patient left: in chair;with call bell/phone within reach;with chair alarm set Nurse Communication: Mobility status         Time: [id number] PT Time Calculation (min) (ACUTE ONLY): 30 min   Charges:   PT Evaluation $PT Eval Moderate Complexity: 1 Procedure PT Treatments $Therapeutic Exercise: 8-22 mins   PT G Codes:        [name] 01/10/2016, 1:51 PM  [name], PT, DPT [phone]

## 2016-01-10 NOTE — Evaluation (Signed)
Occupational Therapy Evaluation Patient Details Name: Teresa BastDeborah W Matin MRN: [id number] DOB: 01-09-1961 Today's Date: 01/10/2016    History of Present Illness Admitted for hyperflycemia and new R side weakness. Pt with history of multiple falls at home over the past few days. Recent admission for CVA. PMH includes DM, anxiety, HTN, and COPD (wears 3L of O2 at baseline). Pt with negative MRI at this time.   Clinical Impression   Pt is 55 year old female who presents with R sided weakness with hx of multiple falls at home over past few days and was admitted last month for CVA .  Pt presents with a flat affect and a delay in responses but cooperative.  Pt has an old bruise under R eye which occurred after patient [facility] while reaching for her remote on the floor and hit her face on the floor.  She is able to complete ADLs with supervision and min assist and cues for safety due to decreased balance as stamina for tasks decreases and for higher level balance tasks when leaning forward for LB dressing.  She is at risk for falls and presents with poor hygiene with dirt and what looks like dried blood under long fingernails.   R side is slightly weaker for UE and hand. Coordination is intact but slow and requires extra time. Patient would like to go home but she would be at risk for falls and would need 24 hour supervision by family.  Rec continued OT while in hospital to continue to work on increasing independence in ADLs, balance and functional mobility training, coordination exercises and family ed and training and HH OT.    Follow Up Recommendations  Home health OT    Equipment Recommendations       Recommendations for Other Services       Precautions / Restrictions Precautions Precautions: Fall Restrictions Weight Bearing Restrictions: No      Mobility Bed Mobility Overal bed mobility: Needs Assistance Bed Mobility: Supine to Sit     Supine to sit:  Supervision;HOB elevated     General bed mobility comments: Supervision given, however no cues necessary. Once seated at EOB, pt with R lateral leaning, needing cues for self correction  Transfers Overall transfer level: Needs assistance Equipment used: Rolling walker (2 wheeled) Transfers: Sit to/from Stand Sit to Stand: Min guard         General transfer comment: safe technique performed with RW. Upright posture noted    Balance Overall balance assessment: History of Falls;Needs assistance Sitting-balance support: Bilateral upper extremity supported;Feet supported Sitting balance-Leahy Scale: Fair     Standing balance support: Bilateral upper extremity supported Standing balance-Leahy Scale: Fair                              ADL Overall ADL's : Needs assistance/impaired Eating/Feeding: Set up;Supervision/ safety   Grooming: Wash/dry hands;Wash/dry face;Brushing hair;Oral care;Independent;Set up Grooming Details (indicate cue type and reason): pt with no teeth but used oral swab to clean mouth independently as well as combing hair         Upper Body Dressing : Set up;Supervision/safety   Lower Body Dressing: Minimal assistance;Set up;Supervision/safety Lower Body Dressing Details (indicate cue type and reason): min assist for balance when standing and longer distances               General ADL Comments: Pt not aware of limitations and has  a delay in processing speed during eval with flat affect.  Poor safety awareness and problem solving skills as well as decreased balance which poses a fall risk.     Vision     Perception     Praxis      Pertinent Vitals/Pain Pain Assessment: 0-10 Pain Score: 2  Faces Pain Scale: Hurts a little bit Pain Location: chronic low back pain Pain Descriptors / Indicators: Aching;Constant Pain Intervention(s): Limited activity within patient's tolerance;Monitored during session;Premedicated before session      Hand Dominance Right   Extremity/Trunk Assessment Upper Extremity Assessment Upper Extremity Assessment: Overall WFL for tasks assessed RUE Deficits / Details: rotator cuff pain which limits shoulder flexion to about 80 degrees; minimal IR/ER; decreased coordination but intact sensation; 4/5 strength  RUE Coordination: decreased fine motor;decreased gross motor LUE Deficits / Details: rotator cuff pain which limits shoulder flexion to about 80 degrees; minimal IR/ER; decreased coordination but intact sensation; 4-/5 strength LUE Coordination: decreased fine motor;decreased gross motor   Lower Extremity Assessment Lower Extremity Assessment: Defer to PT evaluation       Communication Communication Communication: No difficulties   Cognition Arousal/Alertness: Awake/alert Behavior During Therapy: Flat affect Overall Cognitive Status: Within Functional Limits for tasks assessed       Memory: Decreased short-term memory             General Comments       Exercises Exercises: Other exercises Other Exercises Other Exercises: Seated B LE alt marching, hip abd/add, and quad sets. All ther-ex performed x 10 reps with cga and cues for correct technique.   Shoulder Instructions      Home Living Family/patient expects to be discharged to:: Private residence Living Arrangements: Children Available Help at Discharge: Family;Available 24 hours/day Type of Home: House Home Access: Level entry     Home Layout: One level     Bathroom Shower/Tub: Tub/shower unit Shower/tub characteristics: Curtain FirefighterBathroom Toilet: Standard Bathroom Accessibility: Yes   Home Equipment: Environmental consultantWalker - 2 wheels;Bedside commode;Shower seat;Cane - single point;Wheelchair - manual          Prior Functioning/Environment Level of Independence: Independent with assistive device(s)        Comments: chronic back pain---slow responses to questions but appropriate responses to questions         OT Problem List: Decreased strength;Decreased range of motion;Decreased activity tolerance;Decreased coordination;Pain;Decreased cognition;Decreased safety awareness;Impaired UE functional use;Impaired balance (sitting and/or standing)   OT Treatment/Interventions: Self-care/ADL training;Patient/family education;Therapeutic activities;Balance training;Energy conservation    OT Goals(Current goals can be found in the care plan section) Acute Rehab OT Goals Patient Stated Goal: to get my strength back OT Goal Formulation: With patient Time For Goal Achievement: 01/24/16 Potential to Achieve Goals: Good ADL Goals Pt Will Perform Lower Body Dressing: with set-up;with supervision;sit to/from stand (with no LOB) Pt Will Transfer to Toilet: with supervision;stand pivot transfer;regular height toilet  OT Frequency: Min 1X/week   Barriers to D/C:            Co-evaluation              End of Session    Activity Tolerance: Patient limited by fatigue Patient left: in chair;with call bell/phone within reach;with chair alarm set   Time: 1540-1620 OT Time Calculation (min): 40 min Charges:  OT General Charges $OT Visit: 1 Procedure OT Evaluation $OT Eval Moderate Complexity: 1 Procedure OT Treatments $Self Care/Home Management : 23-37 mins G-Codes:    Susanne BordersSusan Ranyia Witting, OTR/L ascom [phone] 01/10/16, 4:33  PM

## 2016-01-10 NOTE — Progress Notes (Addendum)
[facility] - [place] at [facility]                                                                                                                                                                                  Patient Demographics   [name], is a 55 y.o. female, DOB - [birth year], UEA:[id number]  Admit date - 01/09/2016   Admitting Physician Delfino Lovett, MD  Outpatient Primary MD for the patient is Nancy Nordmann, MD   LOS - 1  Subjective: Pt admitted with possible cva, has had recurrent falls, Been admitted at least 3 times within the past 6 months   Review of Systems:   CONSTITUTIONAL: No documented fever. Positive fatigue, positive weakness. No weight gain, no weight loss.  EYES: No blurry or double vision.  ENT: No tinnitus. No postnasal drip. No redness of the oropharynx.  RESPIRATORY: No cough, no wheeze, no hemoptysis. No dyspnea.  CARDIOVASCULAR: No chest pain. No orthopnea. No palpitations. No syncope.  GASTROINTESTINAL: No nausea, no vomiting or diarrhea. No abdominal pain. No melena or hematochezia.  GENITOURINARY: No dysuria or hematuria.  ENDOCRINE: No polyuria or nocturia. No heat or cold intolerance.  HEMATOLOGY: No anemia. No bruising. No bleeding.  INTEGUMENTARY: No rashes. No lesions.  MUSCULOSKELETAL: No arthritis. No swelling. No gout.  NEUROLOGIC: No numbness, tingling, or ataxia. No seizure-type activity.  PSYCHIATRIC: No anxiety. No insomnia. No ADD.    Vitals:   Vitals:   01/10/16 0006 01/10/16 0216 01/10/16 0334 01/10/16 1237  BP: (!) 125/51 (!) 132/56 (!) 127/57 128/62  Pulse: 70 72 69 68  Resp: 18 18 18 16   Temp: 97.9 F (36.6 C) 97.5 F (36.4 C) 98.5 F (36.9 C) 98.6 F (37 C)  TempSrc: Oral Oral  Oral  SpO2: 94% 92% 98% 99%  Weight:      Height:        Wt Readings from Last 3 Encounters:  01/09/16 160 lb (72.6 kg)  12/19/15 160 lb (72.6 kg)  08/30/15 170 lb (77.1 kg)     Intake/Output Summary (Last 24  hours) at 01/10/16 1252 Last data filed at 01/10/16 0419  Gross per 24 hour  Intake             1973 ml  Output                0 ml  Net             1973 ml    Physical Exam:   GENERAL: Pleasant-appearing in no apparent distress.  HEAD, EYES, EARS, NOSE AND THROAT: Atraumatic, normocephalic. Extraocular muscles are intact. Pupils equal and reactive to light. Sclerae  anicteric. No conjunctival injection. No oro-pharyngeal erythema.  NECK: Supple. There is no jugular venous distention. No bruits, no lymphadenopathy, no thyromegaly.  HEART: Regular rate and rhythm,. No murmurs, no rubs, no clicks.  LUNGS: Clear to auscultation bilaterally. No rales or rhonchi. No wheezes.  ABDOMEN: Soft, flat, nontender, nondistended. Has good bowel sounds. No hepatosplenomegaly appreciated.  EXTREMITIES: No evidence of any cyanosis, clubbing, or peripheral edema.  +2 pedal and radial pulses bilaterally.  NEUROLOGIC: The patient is alert, awake, and oriented x3 with no focal motor or sensory deficits appreciated bilaterally.  SKIN: Moist and warm with no rashes appreciated.  Psych: Not anxious, depressed LN: No inguinal LN enlargement    Antibiotics   Anti-infectives    None      Medications   Scheduled Meds: . amitriptyline  25 mg Oral BID  . amLODipine  10 mg Oral Daily  . aspirin  300 mg Rectal Daily   Or  . aspirin  325 mg Oral Daily  . atorvastatin  40 mg Oral QHS  . enoxaparin (LOVENOX) injection  40 mg Subcutaneous Q24H  . gabapentin  600 mg Oral BID  . insulin aspart  10 Units Subcutaneous TID AC  . insulin glargine  20 Units Subcutaneous QHS  . lisinopril  2.5 mg Oral Daily  . magnesium oxide  400 mg Oral QHS  . Melatonin  5 mg Oral QHS  . metoprolol tartrate  25 mg Oral BID  . mometasone-formoterol  2 puff Inhalation BID  . oxyCODONE-acetaminophen  1 tablet Oral Q6H  . tiotropium  18 mcg Inhalation Daily  . traZODone  150 mg Oral QHS  . vortioxetine HBr  1 tablet Oral Daily    Continuous Infusions: PRN Meds:.albuterol, ALPRAZolam, fluticasone, ipratropium-albuterol, senna-docusate   Data Review:   Micro Results No results found for this or any previous visit (from the past 240 hour(s)).  Radiology Reports Dg Chest 2 View  Result Date: 01/10/2016 CLINICAL DATA:  55 year old with current history asthma, COPD, diabetes and hypertension, admitted yesterday with multiple recent falls. EXAM: CHEST  2 VIEW COMPARISON:  01/09/2016, 07/22/2015 and earlier, including CT chest 09/07/2013. FINDINGS: AP sitting view erect and lateral images were obtained. Cardiomediastinal silhouette unremarkable, unchanged. Small right pleural effusion. Lungs clear. Bronchovascular markings normal. Pulmonary vascularity normal. Degenerative changes involving the thoracic spine. IMPRESSION: Small right pleural effusion. No acute cardiopulmonary disease otherwise. Electronically Signed   By: Hulan Saashomas  Lawrence M.D.   On: 01/10/2016 09:27   Dg Lumbar Spine 2-3 Views  Result Date: 12/19/2015 CLINICAL DATA:  Lower extremity weakness since a CVA 1 month ago. History of prior lumbar surgery. EXAM: LUMBAR SPINE - 2-3 VIEW COMPARISON:  Plain films lumbar spine 02/27/2014. FINDINGS: No acute abnormality is identified. The patient is status post L4-5 laminectomy and fusion. Loss of disc space height is again seen at L3-4. 0.6 cm anterolisthesis L3 on L4 due to facet degenerative disease appears worse than on the prior exam. Facet arthropathy at L5-S1 is unchanged. Atherosclerotic vascular disease identified. IMPRESSION: No acute abnormality. Some progression of spondylosis at L3-4. Status post L4-5 fusion. Atherosclerosis. Electronically Signed   By: Drusilla Kannerhomas  Dalessio M.D.   On: 12/19/2015 16:21   Ct Head Wo Contrast  Result Date: 12/19/2015 CLINICAL DATA:  Weakness.  CVA 1 month ago.  Hyperglycemia. EXAM: CT HEAD WITHOUT CONTRAST TECHNIQUE: Contiguous axial images were obtained from the base of the skull  through the vertex without intravenous contrast. COMPARISON:  05/21/2013 FINDINGS: Brain: 3 by 4 mm  lacunar infarct in the posterior limb of the left internal capsule, image 12/2, better appreciated than on the prior exam. Otherwise, the brainstem, cerebellum, cerebral peduncles, thalami, basal ganglia, basilar cisterns, and ventricular system appear within normal limits. No intracranial hemorrhage, mass lesion, or acute CVA. Vascular: Atherosclerotic calcification of the vertebral arteries. Skull: Unremarkable Sinuses/Orbits: Unremarkable Other: No supplemental non-categorized findings. IMPRESSION: 1. 3 by 4 mm hypodense lesion favoring remote lacunar infarct in the posterior limb of the left external capsule. 2. Vertebral artery atherosclerosis. Electronically Signed   By: Gaylyn Rong M.D.   On: 12/19/2015 15:48   Mr Angiogram Neck W Contrast  Result Date: 12/20/2015 CLINICAL DATA:  Falls.  Acute right external capsule infarct on MRI. EXAM: MRA NECK WITHOUT AND WITH CONTRAST MRA HEAD WITHOUT CONTRAST TECHNIQUE: Multiplanar and multiecho pulse sequences of the neck were obtained without and with intravenous contrast. Angiographic images of the neck were obtained using MRA technique without and with intravenous contast.; Angiographic images of the Circle of Willis were obtained using MRA technique without intravenous contrast. CONTRAST:  15mL MULTIHANCE GADOBENATE DIMEGLUMINE 529 MG/ML IV SOLN COMPARISON:  Chest CT 09/07/2013. FINDINGS: MRA NECK FINDINGS Three vessel aortic arch. There is an apparent severe stenosis of the proximal right subclavian artery near its origin, however the vessel was widely patent on a 2015 chest CT and the current appearance could be artifactual. The cervical carotid arteries are widely patent without evidence of stenosis or dissection. The vertebral arteries are patent and codominant with antegrade flow bilaterally. The vertebral artery origins are not well evaluated  bilaterally due to mild motion artifact, and underlying stenosis is not excluded. There is no evidence of significant vertebral artery stenosis elsewhere. MRA HEAD FINDINGS The the visualized distal vertebral arteries are widely patent to the basilar, tortuous, and codominant. Right PICA origin is patent. Left PICA is not clearly identified. AICA and SCA origins are patent. Basilar artery is widely patent. PCAs are patent without evidence of significant stenosis. The internal carotid arteries are patent from skullbase to carotid termini without evidence of significant stenosis allowing for mild motion artifact in the supraclinoid segments. ACAs and MCAs are patent without evidence of major branch occlusion or significant proximal stenosis. The right A1 segment is mildly dominant. No intracranial aneurysm is identified. IMPRESSION: 1. No evidence of major intracranial branch occlusion or significant stenosis. 2. Widely patent cervical carotid arteries. 3. Possible severe proximal right subclavian artery stenosis. However, a normal appearance on a 2015 chest CT and the lack of significant stenosis elsewhere may indicate that this appearance is at least partly artifactual in nature. Consider neck CTA for further assessment of the right subclavian artery as well as the vertebral artery origins which were not well evaluated on this study. Electronically Signed   By: Sebastian Ache M.D.   On: 12/20/2015 11:25   Mr Brain Wo Contrast  Result Date: 01/10/2016 CLINICAL DATA:  Recent CVA.  New onset of multiple falls. EXAM: MRI HEAD WITHOUT CONTRAST MRA HEAD WITHOUT CONTRAST TECHNIQUE: Multiplanar, multiecho pulse sequences of the brain and surrounding structures were obtained without intravenous contrast. Angiographic images of the head were obtained using MRA technique without contrast. COMPARISON:  12/20/2015 MRA 12/19/2015 brain MRI FINDINGS: MRI HEAD FINDINGS Brain: No acute infarction, hemorrhage, hydrocephalus,  extra-axial collection or mass lesion. Resolved restricted diffusion at the right external capsule infarct described previously. Remote lacunar infarct in the left internal capsule/lateral thalamus. Mild microvascular ischemic gliosis in the periventricular white matter. Normal brain volume. No  hemorrhage, hydrocephalus, or mass. Stable mild deformation of the cervicomedullary junction from tortuous vertebral arteries. Vascular: Arterial findings described below. Normal dural venous sinus flow voids. Skull and upper cervical spine: Partially visualized postoperative cervical spine. No evidence of focal marrow lesion. Sinuses/Orbits: Negative MRA HEAD FINDINGS Symmetric carotid and vertebral arteries. Vertebral tortuosity at the V4 segment. No major branch occlusion or flow limiting stenosis. Minimal inferior bulge from the left supraclinoid ICA, measuring 1 to 2 mm, likely a infundibulum based on conical shape on source images. There is scattered overall mild luminal undulation of bilateral MCA and PCA branches which is predominately attributed artifact when compared to prior, although there may also be atheromatous changes. No treatable flow limiting stenosis. IMPRESSION: 1. No acute or interval finding when compared to brain MRI and MRA 12/2015. 2. Remote lacunar infarcts and mild periventricular microvascular ischemic gliosis. 3. 1 to 2 mm left supraclinoid ICA outpouching, favor infundibulum over aneurysm. Electronically Signed   By: Marnee Spring M.D.   On: 01/10/2016 09:38   Mr Brain Wo Contrast  Result Date: 12/19/2015 CLINICAL DATA:  Weakness after stroke 1 month ago. Diffuse pain. Ataxia. History of hypertension and diabetes. EXAM: MRI HEAD WITHOUT CONTRAST TECHNIQUE: Multiplanar, multiecho pulse sequences of the brain and surrounding structures were obtained without intravenous contrast. COMPARISON:  CT HEAD December 19, 2015 at 1534 hours FINDINGS: BRAIN: 5 mm rounded reduced diffusion RIGHT  external capsule/subinsula. No susceptibility artifact to suggest hemorrhage. The ventricles and sulci are normal for patient's age. Old LEFT posterior limb of the internal capsule lacunar infarct with T2 shine through. Scattered subcentimeter supratentorial white matter T2 hyperintensities. No suspicious parenchymal signal, masses or mass effect. No abnormal extra-axial fluid collections. No extra-axial masses though, contrast enhanced sequences would be more sensitive. VASCULAR: Normal major intracranial vascular flow voids present at skull base. SKULL AND UPPER CERVICAL SPINE: No abnormal sellar expansion. No suspicious calvarial bone marrow signal. Craniocervical junction maintained. Small amount of presumed pannus at the odontoid process mildly effaces the ventral thecal sac. SINUSES/ORBITS: Trace maxillary sinus mucosal thickening without paranasal sinus air-fluid levels. The included ocular globes and orbital contents are non-suspicious. OTHER: None. Other: Patient is edentulous. 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 Electronically Signed   By: Awilda Metro M.D.   On: 12/19/2015 20:08   US Carotid Bilateral (at [facility] And [facility] Only)  Result Date: 01/10/2016 CLINICAL DATA:  Fall. EXAM: BILATERAL CAROTID DUPLEX ULTRASOUND TECHNIQUE: Wallace Cullens scale imaging, color Doppler and duplex ultrasound were performed of bilateral carotid and vertebral arteries in the neck. COMPARISON:  MRI 01/10/2016.  CT 12/19/2015. FINDINGS: Criteria: Quantification of carotid stenosis is based on velocity parameters that correlate the residual internal carotid diameter with NASCET-based stenosis levels, using the diameter of the distal internal carotid lumen as the denominator for stenosis measurement. The following velocity measurements were obtained: RIGHT ICA:  77/21 cm/sec CCA:  86/14 cm/sec SYSTOLIC ICA/CCA RATIO:  0.9  DIASTOLIC ICA/CCA RATIO:  1.5 ECA:  129 cm/sec LEFT ICA:  75/22 cm/sec CCA:  90/17 cm/sec SYSTOLIC ICA/CCA RATIO:  0.8 DIASTOLIC ICA/CCA RATIO:  1.3 ECA:  91 cm/sec RIGHT CAROTID ARTERY: Mild right carotid bifurcation and proximal ICA atherosclerotic vascular disease. No flow limiting stenosis. RIGHT VERTEBRAL ARTERY:  Patent with antegrade flow. LEFT CAROTID ARTERY: Mild left carotid bifurcation atherosclerotic vascular disease. No flow limiting stenosis. LEFT VERTEBRAL ARTERY:  Patent with antegrade flow. IMPRESSION: 1. Mild right carotid  bifurcation proximal ICA atherosclerotic vascular disease. Mild left carotid bifurcation atherosclerotic vascular disease. No flow limiting stenosis. Degree of stenosis less than 50% bilaterally. 2. Vertebral arteries are patent with antegrade flow. Electronically Signed   By: Maisie Fus  Register   On: 01/10/2016 10:10   Dg Chest Portable 1 View  Result Date: 01/09/2016 CLINICAL DATA:  CT Diabetes.  Hypertension.  COPD. EXAM: PORTABLE CHEST 1 VIEW COMPARISON:  07/22/2015 FINDINGS: Numerous leads and wires project over the chest. Lower cervical spine fixation. Patient rotated right. Normal heart size. No pleural effusion or pneumothorax. Vague increased density projecting over the left lung base is favored to be due to overlying breast tissue. Right lung clear. IMPRESSION: No acute cardiopulmonary disease. Electronically Signed   By: Jeronimo Greaves M.D.   On: 01/09/2016 13:33   Mr Maxine Glenn Head/brain WU Cm  Result Date: 01/10/2016 CLINICAL DATA:  Recent CVA.  New onset of multiple falls. EXAM: MRI HEAD WITHOUT CONTRAST MRA HEAD WITHOUT CONTRAST TECHNIQUE: Multiplanar, multiecho pulse sequences of the brain and surrounding structures were obtained without intravenous contrast. Angiographic images of the head were obtained using MRA technique without contrast. COMPARISON:  12/20/2015 MRA 12/19/2015 brain MRI FINDINGS: MRI HEAD FINDINGS Brain: No acute infarction, hemorrhage,  hydrocephalus, extra-axial collection or mass lesion. Resolved restricted diffusion at the right external capsule infarct described previously. Remote lacunar infarct in the left internal capsule/lateral thalamus. Mild microvascular ischemic gliosis in the periventricular white matter. Normal brain volume. No hemorrhage, hydrocephalus, or mass. Stable mild deformation of the cervicomedullary junction from tortuous vertebral arteries. Vascular: Arterial findings described below. Normal dural venous sinus flow voids. Skull and upper cervical spine: Partially visualized postoperative cervical spine. No evidence of focal marrow lesion. Sinuses/Orbits: Negative MRA HEAD FINDINGS Symmetric carotid and vertebral arteries. Vertebral tortuosity at the V4 segment. No major branch occlusion or flow limiting stenosis. Minimal inferior bulge from the left supraclinoid ICA, measuring 1 to 2 mm, likely a infundibulum based on conical shape on source images. There is scattered overall mild luminal undulation of bilateral MCA and PCA branches which is predominately attributed artifact when compared to prior, although there may also be atheromatous changes. No treatable flow limiting stenosis. IMPRESSION: 1. No acute or interval finding when compared to brain MRI and MRA 12/2015. 2. Remote lacunar infarcts and mild periventricular microvascular ischemic gliosis. 3. 1 to 2 mm left supraclinoid ICA outpouching, favor infundibulum over aneurysm. Electronically Signed   By: Marnee Spring M.D.   On: 01/10/2016 09:38   Mr Maxine Glenn Head/brain JW Cm  Result Date: 12/20/2015 CLINICAL DATA:  Falls.  Acute right external capsule infarct on MRI. EXAM: MRA NECK WITHOUT AND WITH CONTRAST MRA HEAD WITHOUT CONTRAST TECHNIQUE: Multiplanar and multiecho pulse sequences of the neck were obtained without and with intravenous contrast. Angiographic images of the neck were obtained using MRA technique without and with intravenous contast.; Angiographic  images of the Circle of Willis were obtained using MRA technique without intravenous contrast. CONTRAST:  15mL MULTIHANCE GADOBENATE DIMEGLUMINE 529 MG/ML IV SOLN COMPARISON:  Chest CT 09/07/2013. FINDINGS: MRA NECK FINDINGS Three vessel aortic arch. There is an apparent severe stenosis of the proximal right subclavian artery near its origin, however the vessel was widely patent on a 2015 chest CT and the current appearance could be artifactual. The cervical carotid arteries are widely patent without evidence of stenosis or dissection. The vertebral arteries are patent and codominant with antegrade flow bilaterally. The vertebral artery origins are not well evaluated bilaterally due to mild  motion artifact, and underlying stenosis is not excluded. There is no evidence of significant vertebral artery stenosis elsewhere. MRA HEAD FINDINGS The the visualized distal vertebral arteries are widely patent to the basilar, tortuous, and codominant. Right PICA origin is patent. Left PICA is not clearly identified. AICA and SCA origins are patent. Basilar artery is widely patent. PCAs are patent without evidence of significant stenosis. The internal carotid arteries are patent from skullbase to carotid termini without evidence of significant stenosis allowing for mild motion artifact in the supraclinoid segments. ACAs and MCAs are patent without evidence of major branch occlusion or significant proximal stenosis. The right A1 segment is mildly dominant. No intracranial aneurysm is identified. IMPRESSION: 1. No evidence of major intracranial branch occlusion or significant stenosis. 2. Widely patent cervical carotid arteries. 3. Possible severe proximal right subclavian artery stenosis. However, a normal appearance on a 2015 chest CT and the lack of significant stenosis elsewhere may indicate that this appearance is at least partly artifactual in nature. Consider neck CTA for further assessment of the right subclavian artery as  well as the vertebral artery origins which were not well evaluated on this study. Electronically Signed   By: Sebastian AcheAllen  Grady M.D.   On: 12/20/2015 11:25     CBC  Recent Labs Lab 01/09/16 1244  WBC 5.7  HGB 11.9*  HCT 35.4  PLT 224  MCV 88.1  MCH 29.6  MCHC 33.6  RDW 14.7*    Chemistries   Recent Labs Lab 01/09/16 1244  NA 138  K 4.3  CL 102  CO2 27  GLUCOSE 453*  BUN 22*  CREATININE 1.19*  CALCIUM 8.9   ------------------------------------------------------------------------------------------------------------------ estimated creatinine clearance is 50.4 mL/min (by C-G formula based on SCr of 1.19 mg/dL (H)). ------------------------------------------------------------------------------------------------------------------ No results for input(s): HGBA1C in the last 72 hours. ------------------------------------------------------------------------------------------------------------------  Recent Labs  01/10/16 0545  CHOL 184  HDL 32*  LDLCALC UNABLE TO CALCULATE IF TRIGLYCERIDE OVER 400 mg/dL  TRIG 161527*  CHOLHDL 5.8   ------------------------------------------------------------------------------------------------------------------ No results for input(s): TSH, T4TOTAL, T3FREE, THYROIDAB in the last 72 hours.  Invalid input(s): FREET3 ------------------------------------------------------------------------------------------------------------------ No results for input(s): VITAMINB12, FOLATE, FERRITIN, TIBC, IRON, RETICCTPCT in the last 72 hours.  Coagulation profile No results for input(s): INR, PROTIME in the last 168 hours.  No results for input(s): DDIMER in the last 72 hours.  Cardiac Enzymes  Recent Labs Lab 01/09/16 1244  TROPONINI <0.03   ------------------------------------------------------------------------------------------------------------------ Invalid input(s): POCBNP    Assessment & Plan   55 year old female with history of  diabetes, hypertension, anxiety, and stroke is being admitted for possible new stroke  * Recurrent falls - no  new stroke - old lacunar stroke may be playing in unsteady gait - Possible medication induced, patient's medications being adjusted per neurology - Aspirin - Neurology consultation.  Case discussed with Dr. Thad Rangereynolds by Dr. Fanny BienQuale, She will see patient in the hospital.  * COPD continue oxygen as at home - Stable.  Continue home inhalers  * Diabetes - Hemoglobin A1c - Less sugars are elevated I will increase Lantus  * Hypertension - Continue lisinopril  and Norvasc and metoprolol  - Pressure stable  Miscellaneous Lovenox for DVT prophylaxis      Code Status Orders        Start     Ordered   01/09/16 1708  Full code  Continuous     01/09/16 1708    Code Status History    Date Active Date Inactive Code Status Order ID  Comments User Context   12/19/2015 11:10 PM 12/21/2015  6:28 PM Full Code [id number]  Oralia Manis, MD Inpatient   07/17/2015  8:34 PM 07/23/2015  8:43 PM Full Code [id number]  Enid Baas, MD Inpatient           Consults  neurology  DVT Prophylaxis  Lovenox   Lab Results  Component Value Date   PLT 224 01/09/2016     Time Spent in minutes    Greater than 50% of time spent in care coordination and counseling patient regarding the condition and plan of care.   Auburn Bilberry M.D on 01/10/2016 at 12:52 PM  Between 7am to 6pm - Pager - [phone]  After 6pm go to [email or web address] - password [email or web address] [facility]  [facility]   Office  [phone]

## 2016-01-10 NOTE — Progress Notes (Signed)
CH responded to an OR for an AD. Pt had already received the material but wanted further education which was provided. Pt wants her daughter to go over the material as well. They hope to complete later today or tomorrow. CH provided the ministry of education. CH is available for follow up as needed.    01/10/16 1400  Clinical Encounter Type  Visited With Patient  Visit Type Follow-up;Spiritual support  Spiritual Encounters  Spiritual Needs Literature

## 2016-01-11 LAB — GLUCOSE, CAPILLARY
GLUCOSE-CAPILLARY: 226 mg/dL — AB (ref 65–99)
Glucose-Capillary: 211 mg/dL — ABNORMAL HIGH (ref 65–99)

## 2016-01-11 LAB — HEMOGLOBIN A1C
HEMOGLOBIN A1C: 11.4 % — AB (ref 4.8–5.6)
MEAN PLASMA GLUCOSE: 280 mg/dL

## 2016-01-11 MED ORDER — OXYCODONE-ACETAMINOPHEN 7.5-325 MG PO TABS: 1.0000 | ORAL_TABLET | Freq: Two times a day (BID) | ORAL | 0 refills | Status: AC | PRN

## 2016-01-11 MED ORDER — FENOFIBRATE 160 MG PO TABS
160.0000 mg | ORAL_TABLET | Freq: Every day | ORAL | 0 refills | Status: AC
Start: 2016-01-12 — End: ?

## 2016-01-11 MED ORDER — ALPRAZOLAM 1 MG PO TABS: 1.0000 mg | ORAL_TABLET | Freq: Three times a day (TID) | ORAL | 0 refills | Status: DC | PRN

## 2016-01-11 NOTE — Progress Notes (Signed)
Discharge paperwork reviewed with patient including medication changes. Patient verbalized understanding. Patient is stable for discharge. Patient's daughter to transport home.

## 2016-01-11 NOTE — Discharge Summary (Signed)
[facility] [place] [facility]  Marlene BastDeborah W Fields, [place]55 y.o., DOB 1960/12/26, MRN [id number]. Admission date: 01/09/2016 Discharge Date 01/11/2016 Primary MD Nancy NordmannALVIN C POWELL JR, MD Admitting Physician Delfino LovettVipul Shah, MD  Admission Diagnosis  back pain  Discharge Diagnosis   Active Problems: Fall Previous lacunar stroke Anxiety Asthma COPD Diabetes       Hospital Course Teresa LoganDeborah Fields  is a 55 y.o. female with a known history of diabetes, anxiety, hypertension, and previous strokes. Patient [facility] and they told her she needs admission for new stroke, but she did not like the care that she was receiving and thus left the hospital AMA. Patient came to our ED and was admitted for CVA. She underwent MRI which ruled out an acute stroke. This showed old lacunar infarct. Patient was seen by neurology they felt that her symptoms may be related to over medication her medications were decreased. She is doing better was seen by physical therapy recommended home health. Which is being arranged.             Consults  neurology  Significant Tests:  See full reports for all details     Dg Chest 2 View  Result Date: 01/10/2016 CLINICAL DATA:  55 year old with current history asthma, COPD, diabetes and hypertension, admitted yesterday with multiple recent falls. EXAM: CHEST  2 VIEW COMPARISON:  01/09/2016, 07/22/2015 and earlier, including CT chest 09/07/2013. FINDINGS: AP sitting view erect and lateral images were obtained. Cardiomediastinal silhouette unremarkable, unchanged. Small right pleural effusion. Lungs clear. Bronchovascular markings normal. Pulmonary vascularity normal. Degenerative changes involving the thoracic spine. IMPRESSION: Small right pleural effusion. No acute cardiopulmonary disease otherwise. Electronically Signed   By: Hulan Saashomas  Lawrence M.D.   On: 01/10/2016 09:27   Dg Lumbar Spine 2-3 Views  Result Date: 12/19/2015 CLINICAL  DATA:  Lower extremity weakness since a CVA 1 month ago. History of prior lumbar surgery. EXAM: LUMBAR SPINE - 2-3 VIEW COMPARISON:  Plain films lumbar spine 02/27/2014. FINDINGS: No acute abnormality is identified. The patient is status post L4-5 laminectomy and fusion. Loss of disc space height is again seen at L3-4. 0.6 cm anterolisthesis L3 on L4 due to facet degenerative disease appears worse than on the prior exam. Facet arthropathy at L5-S1 is unchanged. Atherosclerotic vascular disease identified. IMPRESSION: No acute abnormality. Some progression of spondylosis at L3-4. Status post L4-5 fusion. Atherosclerosis. Electronically Signed   By: Drusilla Kannerhomas  Dalessio M.D.   On: 12/19/2015 16:21   Ct Head Wo Contrast  Result Date: 12/19/2015 CLINICAL DATA:  Weakness.  CVA 1 month ago.  Hyperglycemia. EXAM: CT HEAD WITHOUT CONTRAST TECHNIQUE: Contiguous axial images were obtained from the base of the skull through the vertex without intravenous contrast. COMPARISON:  05/21/2013 FINDINGS: Brain: 3 by 4 mm lacunar infarct in the posterior limb of the left internal capsule, image 12/2, better appreciated than on the prior exam. Otherwise, the brainstem, cerebellum, cerebral peduncles, thalami, basal ganglia, basilar cisterns, and ventricular system appear within normal limits. No intracranial hemorrhage, mass lesion, or acute CVA. Vascular: Atherosclerotic calcification of the vertebral arteries. Skull: Unremarkable Sinuses/Orbits: Unremarkable Other: No supplemental non-categorized findings. IMPRESSION: 1. 3 by 4 mm hypodense lesion favoring remote lacunar infarct in the posterior limb of the left external capsule. 2. Vertebral artery atherosclerosis. Electronically Signed   By: Gaylyn RongWalter  Liebkemann M.D.   On: 12/19/2015 15:48   Mr Angiogram Neck W Contrast  Result Date: 12/20/2015 CLINICAL DATA:  Falls.  Acute right  external capsule infarct on MRI. EXAM: MRA NECK WITHOUT AND WITH CONTRAST MRA HEAD WITHOUT CONTRAST  TECHNIQUE: Multiplanar and multiecho pulse sequences of the neck were obtained without and with intravenous contrast. Angiographic images of the neck were obtained using MRA technique without and with intravenous contast.; Angiographic images of the Circle of Willis were obtained using MRA technique without intravenous contrast. CONTRAST:  15mL MULTIHANCE GADOBENATE DIMEGLUMINE 529 MG/ML IV SOLN COMPARISON:  Chest CT 09/07/2013. FINDINGS: MRA NECK FINDINGS Three vessel aortic arch. There is an apparent severe stenosis of the proximal right subclavian artery near its origin, however the vessel was widely patent on a 2015 chest CT and the current appearance could be artifactual. The cervical carotid arteries are widely patent without evidence of stenosis or dissection. The vertebral arteries are patent and codominant with antegrade flow bilaterally. The vertebral artery origins are not well evaluated bilaterally due to mild motion artifact, and underlying stenosis is not excluded. There is no evidence of significant vertebral artery stenosis elsewhere. MRA HEAD FINDINGS The the visualized distal vertebral arteries are widely patent to the basilar, tortuous, and codominant. Right PICA origin is patent. Left PICA is not clearly identified. AICA and SCA origins are patent. Basilar artery is widely patent. PCAs are patent without evidence of significant stenosis. The internal carotid arteries are patent from skullbase to carotid termini without evidence of significant stenosis allowing for mild motion artifact in the supraclinoid segments. ACAs and MCAs are patent without evidence of major branch occlusion or significant proximal stenosis. The right A1 segment is mildly dominant. No intracranial aneurysm is identified. IMPRESSION: 1. No evidence of major intracranial branch occlusion or significant stenosis. 2. Widely patent cervical carotid arteries. 3. Possible severe proximal right subclavian artery stenosis. However,  a normal appearance on a 2015 chest CT and the lack of significant stenosis elsewhere may indicate that this appearance is at least partly artifactual in nature. Consider neck CTA for further assessment of the right subclavian artery as well as the vertebral artery origins which were not well evaluated on this study. Electronically Signed   By: Sebastian Ache M.D.   On: 12/20/2015 11:25   Mr Brain Wo Contrast  Result Date: 01/10/2016 CLINICAL DATA:  Recent CVA.  New onset of multiple falls. EXAM: MRI HEAD WITHOUT CONTRAST MRA HEAD WITHOUT CONTRAST TECHNIQUE: Multiplanar, multiecho pulse sequences of the brain and surrounding structures were obtained without intravenous contrast. Angiographic images of the head were obtained using MRA technique without contrast. COMPARISON:  12/20/2015 MRA 12/19/2015 brain MRI FINDINGS: MRI HEAD FINDINGS Brain: No acute infarction, hemorrhage, hydrocephalus, extra-axial collection or mass lesion. Resolved restricted diffusion at the right external capsule infarct described previously. Remote lacunar infarct in the left internal capsule/lateral thalamus. Mild microvascular ischemic gliosis in the periventricular white matter. Normal brain volume. No hemorrhage, hydrocephalus, or mass. Stable mild deformation of the cervicomedullary junction from tortuous vertebral arteries. Vascular: Arterial findings described below. Normal dural venous sinus flow voids. Skull and upper cervical spine: Partially visualized postoperative cervical spine. No evidence of focal marrow lesion. Sinuses/Orbits: Negative MRA HEAD FINDINGS Symmetric carotid and vertebral arteries. Vertebral tortuosity at the V4 segment. No major branch occlusion or flow limiting stenosis. Minimal inferior bulge from the left supraclinoid ICA, measuring 1 to 2 mm, likely a infundibulum based on conical shape on source images. There is scattered overall mild luminal undulation of bilateral MCA and PCA branches which is  predominately attributed artifact when compared to prior, although there may also be atheromatous changes.  No treatable flow limiting stenosis. IMPRESSION: 1. No acute or interval finding when compared to brain MRI and MRA 12/2015. 2. Remote lacunar infarcts and mild periventricular microvascular ischemic gliosis. 3. 1 to 2 mm left supraclinoid ICA outpouching, favor infundibulum over aneurysm. Electronically Signed   By: Marnee Spring M.D.   On: 01/10/2016 09:38   Mr Brain Wo Contrast  Result Date: 12/19/2015 CLINICAL DATA:  Weakness after stroke 1 month ago. Diffuse pain. Ataxia. History of hypertension and diabetes. EXAM: MRI HEAD WITHOUT CONTRAST TECHNIQUE: Multiplanar, multiecho pulse sequences of the brain and surrounding structures were obtained without intravenous contrast. COMPARISON:  CT HEAD December 19, 2015 at 1534 hours FINDINGS: BRAIN: 5 mm rounded reduced diffusion RIGHT external capsule/subinsula. No susceptibility artifact to suggest hemorrhage. The ventricles and sulci are normal for patient's age. Old LEFT posterior limb of the internal capsule lacunar infarct with T2 shine through. Scattered subcentimeter supratentorial white matter T2 hyperintensities. No suspicious parenchymal signal, masses or mass effect. No abnormal extra-axial fluid collections. No extra-axial masses though, contrast enhanced sequences would be more sensitive. VASCULAR: Normal major intracranial vascular flow voids present at skull base. SKULL AND UPPER CERVICAL SPINE: No abnormal sellar expansion. No suspicious calvarial bone marrow signal. Craniocervical junction maintained. Small amount of presumed pannus at the odontoid process mildly effaces the ventral thecal sac. SINUSES/ORBITS: Trace maxillary sinus mucosal thickening without paranasal sinus air-fluid levels. The included ocular globes and orbital contents are non-suspicious. OTHER: None. Other: Patient is edentulous. IMPRESSION: Acute sub cm RIGHT external  capsule/subinsula infarct. 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 Electronically Signed   By: Awilda Metro M.D.   On: 12/19/2015 20:08   Mr Lumbar Spine Wo Contrast  Result Date: 01/10/2016 CLINICAL DATA:  Low back pain with bilateral leg pain. Multiple falls. Lumbar fusion EXAM: MRI LUMBAR SPINE WITHOUT CONTRAST TECHNIQUE: Multiplanar, multisequence MR imaging of the lumbar spine was performed. No intravenous contrast was administered. COMPARISON:  Lumbar MRI 10/31/2010 FINDINGS: Segmentation:  Normal Alignment: 5 mm anterior listhesis L3-4 has progressed. Remaining alignment anatomic Vertebrae: Pedicle screw and interbody fusion L4-5. Negative for fracture or mass. Conus medullaris: Extends to the L1-2 level and appears normal. Paraspinal and other soft tissues: Postop changes posteriorly at L4-5. No fluid collection in the soft tissues. No retroperitoneal abnormality Disc levels: L1-2:  Negative L2-3: Mild disc bulging and spurring with mild facet degeneration. Mild narrowing of the canal L3-4: Moderate spinal stenosis which has progressed in the interval. Progressive anterior listhesis with severe facet degeneration and ligamentum flavum hypertrophy. Progressive disc bulging. Compression of the left L3 nerve root in the foramen due to foraminal encroachment. Mild right foraminal narrowing. L4-5: PLIF. Right foraminal narrowing due to spurring. Posterior decompression without canal stenosis. L5-S1: Mild disc bulging. Bilateral facet hypertrophy. Mild foraminal narrowing bilaterally is unchanged. IMPRESSION: Moderate spinal stenosis L3-4 has progressed since 2012. Progressive anterior listhesis and disc and facet degeneration since prior study. Left L3 nerve root compression in the foramen PLIF L4-5. Right foraminal narrowing due to spurring. No significant canal stenosis Mild foraminal narrowing bilaterally L5-S1 due to disc bulging and facet hypertrophy.  Electronically Signed   By: Marlan Palau M.D.   On: 01/10/2016 13:37   Mr Cervical Spine W Wo Contrast  Result Date: 01/10/2016 CLINICAL DATA:  Recurrent falls. Neck and bilateral arm pain. History of prior cervical surgery. EXAM: MRI CERVICAL SPINE WITHOUT AND WITH CONTRAST TECHNIQUE: Multiplanar and multiecho pulse sequences of the cervical spine, to  include the craniocervical junction and cervicothoracic junction, were obtained without and with intravenous contrast. CONTRAST:  15 ml MULTIHANCE GADOBENATE DIMEGLUMINE 529 MG/ML IV SOLN COMPARISON:  MRI cervical spine 11/14/2010 FINDINGS: Alignment: Maintained. Vertebrae: No fracture or worrisome lesion. Status post C4-7 fusion as seen on the prior MRI. Cord: Normal signal throughout. Posterior Fossa, vertebral arteries, paraspinal tissues: Unremarkable. Disc levels: C2-3:  Negative. C3-4: Shallow broad-based central protrusion without central canal or foraminal stenosis is new since the prior exam. C4-5: Status post discectomy and fusion. The central canal and foramina are open. C5-6: Status post discectomy and fusion. The central canal and foramina are open. C6-7: Status post discectomy and fusion. There is some posterior bony ridging to the left which is unchanged. The central canal and foramina are open. C7-T1:  Negative. IMPRESSION: No acute abnormality or finding to explain the patient's symptoms. Broad-based central protrusion at C3-4 without central canal or foraminal stenosis is new since 2012. No change in the appearance of C4-7 fusion. The central canal and foramina appear open at the surgical levels. Electronically Signed   By: Drusilla Kanner M.D.   On: 01/10/2016 13:48   US Carotid Bilateral (at [facility] Only)  Result Date: 01/10/2016 CLINICAL DATA:  Fall. EXAM: BILATERAL CAROTID DUPLEX ULTRASOUND TECHNIQUE: Wallace Cullens scale imaging, color Doppler and duplex ultrasound were performed of bilateral carotid and vertebral arteries in the neck.  COMPARISON:  MRI 01/10/2016.  CT 12/19/2015. FINDINGS: Criteria: Quantification of carotid stenosis is based on velocity parameters that correlate the residual internal carotid diameter with NASCET-based stenosis levels, using the diameter of the distal internal carotid lumen as the denominator for stenosis measurement. The following velocity measurements were obtained: RIGHT ICA:  77/21 cm/sec CCA:  86/14 cm/sec SYSTOLIC ICA/CCA RATIO:  0.9 DIASTOLIC ICA/CCA RATIO:  1.5 ECA:  129 cm/sec LEFT ICA:  75/22 cm/sec CCA:  90/17 cm/sec SYSTOLIC ICA/CCA RATIO:  0.8 DIASTOLIC ICA/CCA RATIO:  1.3 ECA:  91 cm/sec RIGHT CAROTID ARTERY: Mild right carotid bifurcation and proximal ICA atherosclerotic vascular disease. No flow limiting stenosis. RIGHT VERTEBRAL ARTERY:  Patent with antegrade flow. LEFT CAROTID ARTERY: Mild left carotid bifurcation atherosclerotic vascular disease. No flow limiting stenosis. LEFT VERTEBRAL ARTERY:  Patent with antegrade flow. IMPRESSION: 1. Mild right carotid bifurcation proximal ICA atherosclerotic vascular disease. Mild left carotid bifurcation atherosclerotic vascular disease. No flow limiting stenosis. Degree of stenosis less than 50% bilaterally. 2. Vertebral arteries are patent with antegrade flow. Electronically Signed   By: Maisie Fus  Register   On: 01/10/2016 10:10   Dg Chest Portable 1 View  Result Date: 01/09/2016 CLINICAL DATA:  CT Diabetes.  Hypertension.  COPD. EXAM: PORTABLE CHEST 1 VIEW COMPARISON:  07/22/2015 FINDINGS: Numerous leads and wires project over the chest. Lower cervical spine fixation. Patient rotated right. Normal heart size. No pleural effusion or pneumothorax. Vague increased density projecting over the left lung base is favored to be due to overlying breast tissue. Right lung clear. IMPRESSION: No acute cardiopulmonary disease. Electronically Signed   By: Jeronimo Greaves M.D.   On: 01/09/2016 13:33   Mr Maxine Glenn Head/brain ZO Cm  Result Date: 01/10/2016 CLINICAL DATA:   Recent CVA.  New onset of multiple falls. EXAM: MRI HEAD WITHOUT CONTRAST MRA HEAD WITHOUT CONTRAST TECHNIQUE: Multiplanar, multiecho pulse sequences of the brain and surrounding structures were obtained without intravenous contrast. Angiographic images of the head were obtained using MRA technique without contrast. COMPARISON:  12/20/2015 MRA 12/19/2015 brain MRI FINDINGS: MRI HEAD FINDINGS Brain: No acute infarction,  hemorrhage, hydrocephalus, extra-axial collection or mass lesion. Resolved restricted diffusion at the right external capsule infarct described previously. Remote lacunar infarct in the left internal capsule/lateral thalamus. Mild microvascular ischemic gliosis in the periventricular white matter. Normal brain volume. No hemorrhage, hydrocephalus, or mass. Stable mild deformation of the cervicomedullary junction from tortuous vertebral arteries. Vascular: Arterial findings described below. Normal dural venous sinus flow voids. Skull and upper cervical spine: Partially visualized postoperative cervical spine. No evidence of focal marrow lesion. Sinuses/Orbits: Negative MRA HEAD FINDINGS Symmetric carotid and vertebral arteries. Vertebral tortuosity at the V4 segment. No major branch occlusion or flow limiting stenosis. Minimal inferior bulge from the left supraclinoid ICA, measuring 1 to 2 mm, likely a infundibulum based on conical shape on source images. There is scattered overall mild luminal undulation of bilateral MCA and PCA branches which is predominately attributed artifact when compared to prior, although there may also be atheromatous changes. No treatable flow limiting stenosis. IMPRESSION: 1. No acute or interval finding when compared to brain MRI and MRA 12/2015. 2. Remote lacunar infarcts and mild periventricular microvascular ischemic gliosis. 3. 1 to 2 mm left supraclinoid ICA outpouching, favor infundibulum over aneurysm. Electronically Signed   By: Marnee SpringJonathon  Watts M.D.   On:  01/10/2016 09:38   Mr Maxine GlennMra Head/brain RUWo Cm  Result Date: 12/20/2015 CLINICAL DATA:  Falls.  Acute right external capsule infarct on MRI. EXAM: MRA NECK WITHOUT AND WITH CONTRAST MRA HEAD WITHOUT CONTRAST TECHNIQUE: Multiplanar and multiecho pulse sequences of the neck were obtained without and with intravenous contrast. Angiographic images of the neck were obtained using MRA technique without and with intravenous contast.; Angiographic images of the Circle of Willis were obtained using MRA technique without intravenous contrast. CONTRAST:  15mL MULTIHANCE GADOBENATE DIMEGLUMINE 529 MG/ML IV SOLN COMPARISON:  Chest CT 09/07/2013. FINDINGS: MRA NECK FINDINGS Three vessel aortic arch. There is an apparent severe stenosis of the proximal right subclavian artery near its origin, however the vessel was widely patent on a 2015 chest CT and the current appearance could be artifactual. The cervical carotid arteries are widely patent without evidence of stenosis or dissection. The vertebral arteries are patent and codominant with antegrade flow bilaterally. The vertebral artery origins are not well evaluated bilaterally due to mild motion artifact, and underlying stenosis is not excluded. There is no evidence of significant vertebral artery stenosis elsewhere. MRA HEAD FINDINGS The the visualized distal vertebral arteries are widely patent to the basilar, tortuous, and codominant. Right PICA origin is patent. Left PICA is not clearly identified. AICA and SCA origins are patent. Basilar artery is widely patent. PCAs are patent without evidence of significant stenosis. The internal carotid arteries are patent from skullbase to carotid termini without evidence of significant stenosis allowing for mild motion artifact in the supraclinoid segments. ACAs and MCAs are patent without evidence of major branch occlusion or significant proximal stenosis. The right A1 segment is mildly dominant. No intracranial aneurysm is  identified. IMPRESSION: 1. No evidence of major intracranial branch occlusion or significant stenosis. 2. Widely patent cervical carotid arteries. 3. Possible severe proximal right subclavian artery stenosis. However, a normal appearance on a 2015 chest CT and the lack of significant stenosis elsewhere may indicate that this appearance is at least partly artifactual in nature. Consider neck CTA for further assessment of the right subclavian artery as well as the vertebral artery origins which were not well evaluated on this study. Electronically Signed   By: Sebastian AcheAllen  Grady M.D.   On: 12/20/2015  11:25       Today   Subjective:   Rebekkah Powless feels better denies any complaints  Objective:   Blood pressure (!) 108/52, pulse 71, temperature 97.8 F (36.6 C), temperature source Oral, resp. rate 16, height 5\' 3"  (1.6 m), weight 160 lb (72.6 kg), SpO2 97 %.  .  Intake/Output Summary (Last 24 hours) at 01/11/16 1245 Last data filed at 01/10/16 1700  Gross per 24 hour  Intake              240 ml  Output                0 ml  Net              240 ml    Exam VITAL SIGNS: Blood pressure (!) 108/52, pulse 71, temperature 97.8 F (36.6 C), temperature source Oral, resp. rate 16, height 5\' 3"  (1.6 m), weight 160 lb (72.6 kg), SpO2 97 %.  GENERAL:  55 y.o.-year-old patient lying in the bed with no acute distress.  EYES: Pupils equal, round, reactive to light and accommodation. No scleral icterus. Extraocular muscles intact.  HEENT: Head atraumatic, normocephalic. Oropharynx and nasopharynx clear.  NECK:  Supple, no jugular venous distention. No thyroid enlargement, no tenderness.  LUNGS: Normal breath sounds bilaterally, no wheezing, rales,rhonchi or crepitation. No use of accessory muscles of respiration.  CARDIOVASCULAR: S1, S2 normal. No murmurs, rubs, or gallops.  ABDOMEN: Soft, nontender, nondistended. Bowel sounds present. No organomegaly or mass.  EXTREMITIES: No pedal edema, cyanosis, or  clubbing.  NEUROLOGIC: Cranial nerves II through XII are intact. Muscle strength 5/5 in all extremities. Sensation intact. Gait not checked.  PSYCHIATRIC: The patient is alert and oriented x 3.  SKIN: No obvious rash, lesion, or ulcer.   Data Review     CBC w Diff: Lab Results  Component Value Date   WBC 5.7 01/09/2016   HGB 11.9 (L) 01/09/2016   HGB 12.6 09/25/2013   HCT 35.4 01/09/2016   HCT 37.9 09/25/2013   PLT 224 01/09/2016   PLT 177 09/25/2013   LYMPHOPCT 20 12/19/2015   LYMPHOPCT 10.8 09/25/2013   MONOPCT 4 12/19/2015   MONOPCT 2.5 09/25/2013   EOSPCT 5 12/19/2015   EOSPCT 0.0 09/25/2013   BASOPCT 1 12/19/2015   BASOPCT 0.1 09/25/2013   CMP: Lab Results  Component Value Date   NA 138 01/09/2016   NA 137 09/25/2013   K 4.3 01/09/2016   K 4.0 09/25/2013   CL 102 01/09/2016   CL 99 09/25/2013   CO2 27 01/09/2016   CO2 28 09/25/2013   BUN 22 (H) 01/09/2016   BUN 26 (H) 09/25/2013   CREATININE 1.19 (H) 01/09/2016   CREATININE 0.96 09/25/2013   PROT 7.4 12/19/2015   PROT 7.8 05/01/2012   ALBUMIN 3.3 (L) 12/19/2015   ALBUMIN 3.5 05/01/2012   BILITOT 0.4 12/19/2015   BILITOT 0.4 05/01/2012   ALKPHOS 121 12/19/2015   ALKPHOS 126 05/01/2012   AST 24 12/19/2015   AST 15 05/01/2012   ALT 13 (L) 12/19/2015   ALT 19 05/01/2012  .  Micro Results No results found for this or any previous visit (from the past 240 hour(s)).      Code Status Orders        Start     Ordered   01/09/16 1708  Full code  Continuous     01/09/16 1708    Code Status History    Date Active Date Inactive Code  Status Order ID Comments User Context   12/19/2015 11:10 PM 12/21/2015  6:28 PM Full Code [id number]  Oralia Manis, MD Inpatient   07/17/2015  8:34 PM 07/23/2015  8:43 PM Full Code [id number]  Enid Baas, MD Inpatient          Follow-up Information    Nancy Nordmann, MD. Go on 01/15/2016.   Specialty:  Family Medicine Why:  @1 :00 PM PLEASE ARRIVE EARLY  AND BRING MEDICATION LIST Contact information: [address] [facility] [place] [address] [phone]           Discharge Medications     Medication List    STOP taking these medications   zolpidem 10 MG tablet Commonly known as:  AMBIEN     TAKE these medications   albuterol (2.5 MG/3ML) 0.083% nebulizer solution Commonly known as:  PROVENTIL Take 2.5 mg by nebulization every 6 (six) hours as needed for wheezing or shortness of breath.   albuterol-ipratropium 18-103 MCG/ACT inhaler Commonly known as:  COMBIVENT Inhale 1 puff into the lungs every 6 (six) hours as needed for wheezing or shortness of breath.   ALPRAZolam 1 MG tablet Commonly known as:  XANAX Take 1 tablet (1 mg total) by mouth every 8 (eight) hours as needed for anxiety. What changed:  when to take this   amitriptyline 25 MG tablet Commonly known as:  ELAVIL Take 25 mg by mouth 3 (three) times daily.   amLODipine 10 MG tablet Commonly known as:  NORVASC Take 10 mg by mouth daily.   aspirin EC 81 MG tablet Take by mouth daily.   aspirin 325 MG EC tablet Take 1 tablet (325 mg total) by mouth daily.   atorvastatin 40 MG tablet Commonly known as:  LIPITOR Take 40 mg by mouth at bedtime.   fenofibrate 160 MG tablet Take 1 tablet (160 mg total) by mouth daily. Start taking on:  01/12/2016   fluticasone 50 MCG/ACT nasal spray Commonly known as:  FLONASE Place 2 sprays into both nostrils daily as needed for rhinitis.   Fluticasone-Salmeterol 250-50 MCG/DOSE Aepb Commonly known as:  ADVAIR Inhale 1 puff into the lungs 2 (two) times daily.   gabapentin 300 MG capsule Commonly known as:  NEURONTIN Take 2 capsules (600 mg total) by mouth 2 (two) times daily. What changed:  when to take this   insulin aspart 100 UNIT/ML injection Commonly known as:  novoLOG Inject 0-5 Units into the skin at bedtime. What changed:  how much to take  when to take this    insulin glargine 100 unit/mL Sopn Commonly known as:  LANTUS Inject 0.2 mLs (20 Units total) into the skin at bedtime. What changed:  how much to take   lisinopril 2.5 MG tablet Commonly known as:  PRINIVIL,ZESTRIL Take 2.5 mg by mouth daily.   magnesium oxide 400 (241.3 Mg) MG tablet Commonly known as:  MAG-OX Take 400 mg by mouth 2 (two) times daily.   Melatonin 3 MG Tabs Take 1 tablet by mouth at bedtime.   metoprolol tartrate 25 MG tablet Commonly known as:  LOPRESSOR Take 25 mg by mouth 2 (two) times daily.   oxyCODONE-acetaminophen 7.5-325 MG tablet Commonly known as:  PERCOCET Take 1 tablet by mouth every 12 (twelve) hours as needed for severe pain. Every 4-6 hours What changed:  when to take this   tiotropium 18 MCG inhalation capsule Commonly known as:  SPIRIVA Place 18 mcg into inhaler and inhale  daily.   traZODone 100 MG tablet Commonly known as:  DESYREL Take 100 mg by mouth at bedtime as needed for sleep.   TRINTELLIX 10 MG Tabs Generic drug:  vortioxetine HBr Take 1 tablet by mouth daily.          Total Time in preparing paper work, data evaluation and todays exam - 35 minutes  Auburn Bilberry M.D on 01/11/2016 at 12:45 PM  [facility]   Office  [phone]

## 2016-01-11 NOTE — Care Management (Signed)
Admitted to this facility with the diagnosis of stroke. Daughter, Velda ShellCrystal Johnson, lives in the home [phone]). Last seen Dr. Lowell GuitarPowell 4-5 months ago. Rolling walker, wheelchair, and bedside commode in the home. Prescriptions are filled at [facility] in [place]. Home Health in the past. Doesn't remember name of agency. No skilled nursing. Wears oxygen at night x 3-4 year. 3 liters per nasal cannula. Can't remember name of company. Larey SeatFell prior to this admission 5-6 times. Good appetite. Takes care of all basic activities of daily living herself, doesn't drive.  Physical therapy evaluation completed. Recommending home with home health/physical therapy. Referral given to [facility]. Social Worker added to services. Daughter will transport. Discharged to home today per Dr. Allena KatzPatel.  Gwenette GreetBrenda S Jovan Schickling RN MSN CCM Care Management

## 2016-01-11 NOTE — Progress Notes (Signed)
CH made a follow up visit. Pt presented feeling better today, but weak. Pt stated that walking around "wears me out". Pt hopes to get some type of physical therapy to help. Pt stated that she was still looking over the AD and anticipates filling it out with her daughter later today or tomorrow. CH provided the ministry of prayer. CH is available for follow up as needed.    01/11/16 1300  Clinical Encounter Type  Visited With Patient  Visit Type Follow-up;Spiritual support  Referral From Nurse  Spiritual Encounters  Spiritual Needs Prayer;Emotional

## 2016-01-11 NOTE — Progress Notes (Signed)
Inpatient Diabetes Program Recommendations  AACE/ADA: New Consensus Statement on Inpatient Glycemic Control (2015)  Target Ranges:  Prepandial:   less than 140 mg/dL      Peak postprandial:   less than 180 mg/dL (1-2 hours)      Critically ill patients:  140 - 180 mg/dL   Lab Results  Component Value Date   GLUCAP 155 (H) 01/10/2016   HGBA1C 11.4 (H) 01/10/2016    Review of Glycemic Control  Results for [name], Infinity W (MRN [id number]) as of 01/11/2016 07:37  Ref. Range 01/09/2016 21:39 01/10/2016 10:21 01/10/2016 17:18 01/10/2016 21:29 01/11/2016 07:35  Glucose-Capillary Latest Ref Range: 65 - 99 mg/dL 409311 (H) 811241 (H) 914353 (H) 155 (H) 211 (H)    Diabetes history: Type 2, patient [facility] Low[facility]ell[facility]  Outpatient Diabetes medications: Lantus 72 units qhs, Novolog 21 units tid with meals- confirmed with patient at the bedside   Current orders for Inpatient glycemic control: Lantus 30 units qhs, Novolog 10 units tid, Novolog sensitive correction   Agree with current medications for blood sugar management.   Susette RacerJulie Miranda Frese, RN, BA, MHA, CDE Diabetes Coordinator Inpatient Diabetes Program  [phone] (Team Pager) [phone] [facility] Office) 01/11/2016 7:37 AM

## 2016-01-11 NOTE — Progress Notes (Signed)
Physical Therapy Treatment Patient Details Name: [name] MRN: [id number] DOB: [birth year] Today's Date: 01/11/2016    History of Present Illness Admitted for hyperflycemia and new R side weakness. Pt with history of multiple falls at home over the past few days. Recent admission for CVA. PMH includes DM, anxiety, HTN, and COPD (wears 3L of O2 at baseline). Pt with negative MRI at this time.    PT Comments    Pt is making good progress towards goals with increased ambulation endurance noted this session. Reciprocal gait pattern performed with improved balance using RW. Safe technique with performance of there-ex with minor LOB while standing, needing cga for correction. Recommend using RW for all mobility to decrease risk of falling. Will continue to assess as able. Educated to keeping walking with Engineer, manufacturingN staff daily.  Follow Up Recommendations  Home health PT;Supervision for mobility/OOB     Equipment Recommendations       Recommendations for Other Services       Precautions / Restrictions Precautions Precautions: Fall Restrictions Weight Bearing Restrictions: No    Mobility  Bed Mobility               General bed mobility comments: not performed as patient received in recliner.  Transfers Overall transfer level: Needs assistance Equipment used: Rolling walker (2 wheeled) Transfers: Sit to/from Stand Sit to Stand: Min guard         General transfer comment: safe technique performed including pushing from seated position. Once standing, no LOB noted and upright posture achieved.  Ambulation/Gait Ambulation/Gait assistance: Min guard Ambulation Distance (Feet): 200 Feet Assistive device: Rolling walker (2 wheeled) Gait Pattern/deviations: Step-through pattern     General Gait Details: ambulated using rw and reciprocal gait pattern. No LOB noted, however slight tendency to lean towards R side during ambulation, able to self correct. Improved attention to task  and obstacle avoidance.   Stairs            Wheelchair Mobility    Modified Rankin (Stroke Patients Only)       Balance                                    Cognition Arousal/Alertness: Awake/alert Behavior During Therapy: Flat affect Overall Cognitive Status: Within Functional Limits for tasks assessed                      Exercises Other Exercises Other Exercises: seated/standing ther-ex performed including standing hip abd/add (with 1 LOB while holding onto tray table), sit->stands, seated LAQ with resistance, and SAQ with feet together. All ther-ex performed x 10 reps with cga and cues for correct technique.    General Comments        Pertinent Vitals/Pain Pain Assessment: No/denies pain    Home Living                      Prior Function            PT Goals (current goals can now be found in the care plan section) Acute Rehab PT Goals Patient Stated Goal: to get my strength back PT Goal Formulation: With patient Time For Goal Achievement: 01/24/16 Potential to Achieve Goals: Good Progress towards PT goals: Progressing toward goals    Frequency    Min 2X/week      PT Plan Current plan remains appropriate    Co-evaluation  End of Session Equipment Utilized During Treatment: Gait belt Activity Tolerance: Patient tolerated treatment well Patient left: in chair;with call bell/phone within reach;with chair alarm set     Time: 1042-1106 PT Time Calculation (min) (ACUTE ONLY): 24 min  Charges:  $Gait Training: 8-22 mins $Therapeutic Exercise: 8-22 mins                    G Codes:      Lessa Huge 01/11/2016, 11:53 AM  [name], PT, DPT [phone]

## 2016-01-14 NOTE — Progress Notes (Signed)
[facility]  Patient Status: not taken under care, patient does not have a qualifying diagnosis for therapy under Medicaid guidelines, informed case manager Terrilee CroakBrenda Holland.        [name] 01/14/2016, 10:33 AM

## 2016-03-04 ENCOUNTER — Inpatient Hospital Stay
Admission: EM | Admit: 2016-03-04 | Discharge: 2016-03-06 | DRG: 190 | Disposition: A | Discharge status: Home / Self Care | Payer: Medicaid Other | Attending: Internal Medicine | Admitting: Internal Medicine

## 2016-03-04 ENCOUNTER — Emergency Department: Payer: Medicaid Other

## 2016-03-04 ENCOUNTER — Inpatient Hospital Stay: Payer: Medicaid Other

## 2016-03-04 DIAGNOSIS — F1110 Opioid abuse, uncomplicated: Secondary | ICD-10-CM | POA: Diagnosis present

## 2016-03-04 DIAGNOSIS — I10 Essential (primary) hypertension: Secondary | ICD-10-CM | POA: Diagnosis present

## 2016-03-04 DIAGNOSIS — Z7951 Long term (current) use of inhaled steroids: Secondary | ICD-10-CM | POA: Diagnosis not present

## 2016-03-04 DIAGNOSIS — F419 Anxiety disorder, unspecified: Secondary | ICD-10-CM | POA: Diagnosis present

## 2016-03-04 DIAGNOSIS — Z888 Allergy status to other drugs, medicaments and biological substances status: Secondary | ICD-10-CM | POA: Diagnosis not present

## 2016-03-04 DIAGNOSIS — Z794 Long term (current) use of insulin: Secondary | ICD-10-CM

## 2016-03-04 DIAGNOSIS — Z79899 Other long term (current) drug therapy: Secondary | ICD-10-CM | POA: Diagnosis not present

## 2016-03-04 DIAGNOSIS — R0689 Other abnormalities of breathing: Secondary | ICD-10-CM

## 2016-03-04 DIAGNOSIS — G9340 Encephalopathy, unspecified: Secondary | ICD-10-CM | POA: Diagnosis present

## 2016-03-04 DIAGNOSIS — Z9049 Acquired absence of other specified parts of digestive tract: Secondary | ICD-10-CM | POA: Diagnosis not present

## 2016-03-04 DIAGNOSIS — Z880 Allergy status to penicillin: Secondary | ICD-10-CM | POA: Diagnosis not present

## 2016-03-04 DIAGNOSIS — Z23 Encounter for immunization: Secondary | ICD-10-CM

## 2016-03-04 DIAGNOSIS — R739 Hyperglycemia, unspecified: Secondary | ICD-10-CM

## 2016-03-04 DIAGNOSIS — E875 Hyperkalemia: Secondary | ICD-10-CM | POA: Diagnosis present

## 2016-03-04 DIAGNOSIS — Z8249 Family history of ischemic heart disease and other diseases of the circulatory system: Secondary | ICD-10-CM

## 2016-03-04 DIAGNOSIS — J9602 Acute respiratory failure with hypercapnia: Secondary | ICD-10-CM

## 2016-03-04 DIAGNOSIS — Z9981 Dependence on supplemental oxygen: Secondary | ICD-10-CM

## 2016-03-04 DIAGNOSIS — Z8673 Personal history of transient ischemic attack (TIA), and cerebral infarction without residual deficits: Secondary | ICD-10-CM

## 2016-03-04 DIAGNOSIS — G4700 Insomnia, unspecified: Secondary | ICD-10-CM | POA: Diagnosis present

## 2016-03-04 DIAGNOSIS — J9622 Acute and chronic respiratory failure with hypercapnia: Secondary | ICD-10-CM | POA: Diagnosis present

## 2016-03-04 DIAGNOSIS — R2681 Unsteadiness on feet: Secondary | ICD-10-CM

## 2016-03-04 DIAGNOSIS — Z808 Family history of malignant neoplasm of other organs or systems: Secondary | ICD-10-CM | POA: Diagnosis not present

## 2016-03-04 DIAGNOSIS — J9601 Acute respiratory failure with hypoxia: Secondary | ICD-10-CM

## 2016-03-04 DIAGNOSIS — Z7982 Long term (current) use of aspirin: Secondary | ICD-10-CM

## 2016-03-04 DIAGNOSIS — N179 Acute kidney failure, unspecified: Secondary | ICD-10-CM | POA: Diagnosis present

## 2016-03-04 DIAGNOSIS — F17210 Nicotine dependence, cigarettes, uncomplicated: Secondary | ICD-10-CM | POA: Diagnosis present

## 2016-03-04 DIAGNOSIS — E785 Hyperlipidemia, unspecified: Secondary | ICD-10-CM | POA: Diagnosis present

## 2016-03-04 DIAGNOSIS — E871 Hypo-osmolality and hyponatremia: Secondary | ICD-10-CM | POA: Diagnosis present

## 2016-03-04 DIAGNOSIS — J441 Chronic obstructive pulmonary disease with (acute) exacerbation: Secondary | ICD-10-CM | POA: Diagnosis not present

## 2016-03-04 DIAGNOSIS — E1165 Type 2 diabetes mellitus with hyperglycemia: Secondary | ICD-10-CM | POA: Diagnosis present

## 2016-03-04 LAB — URINALYSIS, COMPLETE (UACMP) WITH MICROSCOPIC
Bilirubin Urine: NEGATIVE
Glucose, UA: >=500 mg/dL — AB
Ketones, ur: NEGATIVE mg/dL
Leukocytes, UA: NEGATIVE
NITRITE: NEGATIVE
Protein, ur: 100 mg/dL — AB
SPECIFIC GRAVITY, URINE: 1.022 (ref 1.005–1.030)
pH: 5 (ref 5.0–8.0)

## 2016-03-04 LAB — COMPREHENSIVE METABOLIC PANEL
ALK PHOS: 133 U/L — AB (ref 38–126)
ALT: 11 U/L — AB (ref 14–54)
AST: 20 U/L (ref 15–41)
Albumin: 3.3 g/dL — ABNORMAL LOW (ref 3.5–5.0)
Anion gap: 7 (ref 5–15)
BUN: 24 mg/dL — ABNORMAL HIGH (ref 6–20)
CALCIUM: 8.9 mg/dL (ref 8.9–10.3)
CO2: 29 mmol/L (ref 22–32)
CREATININE: 1.44 mg/dL — AB (ref 0.44–1.00)
Chloride: 97 mmol/L — ABNORMAL LOW (ref 101–111)
GFR, EST AFRICAN AMERICAN: 46 mL/min — AB (ref >60)
GFR, EST NON AFRICAN AMERICAN: 40 mL/min — AB (ref >60)
Glucose, Bld: 620 mg/dL (ref 65–99)
Potassium: 5.2 mmol/L — ABNORMAL HIGH (ref 3.5–5.1)
SODIUM: 133 mmol/L — AB (ref 135–145)
Total Bilirubin: 1 mg/dL (ref 0.3–1.2)
Total Protein: 7.2 g/dL (ref 6.5–8.1)

## 2016-03-04 LAB — CBC
HEMATOCRIT: 37.1 % (ref 35.0–47.0)
HEMOGLOBIN: 12.4 g/dL (ref 12.0–16.0)
MCH: 29.6 pg (ref 26.0–34.0)
MCHC: 33.5 g/dL (ref 32.0–36.0)
MCV: 88.5 fL (ref 80.0–100.0)
Platelets: 234 10*3/uL (ref 150–440)
RBC: 4.19 MIL/uL (ref 3.80–5.20)
RDW: 14.1 % (ref 11.5–14.5)
WBC: 8.8 10*3/uL (ref 3.6–11.0)

## 2016-03-04 LAB — INFLUENZA PANEL BY PCR (TYPE A & B)
INFLAPCR: NEGATIVE
Influenza B By PCR: NEGATIVE

## 2016-03-04 LAB — BASIC METABOLIC PANEL
ANION GAP: 9 (ref 5–15)
BUN: 21 mg/dL — ABNORMAL HIGH (ref 6–20)
CALCIUM: 8.9 mg/dL (ref 8.9–10.3)
CO2: 26 mmol/L (ref 22–32)
Chloride: 104 mmol/L (ref 101–111)
Creatinine, Ser: 0.89 mg/dL (ref 0.44–1.00)
GFR calc Af Amer: >60 mL/min (ref >60)
GLUCOSE: 367 mg/dL — AB (ref 65–99)
POTASSIUM: 4.1 mmol/L (ref 3.5–5.1)
Sodium: 139 mmol/L (ref 135–145)

## 2016-03-04 LAB — URINE DRUG SCREEN, QUALITATIVE (ARMC ONLY)
AMPHETAMINES, UR SCREEN: NOT DETECTED
BARBITURATES, UR SCREEN: NOT DETECTED
BENZODIAZEPINE, UR SCRN: POSITIVE — AB
Cannabinoid 50 Ng, Ur ~~LOC~~: NOT DETECTED
Cocaine Metabolite,Ur ~~LOC~~: NOT DETECTED
MDMA (Ecstasy)Ur Screen: NOT DETECTED
METHADONE SCREEN, URINE: NOT DETECTED
Opiate, Ur Screen: NOT DETECTED
Phencyclidine (PCP) Ur S: NOT DETECTED
Tricyclic, Ur Screen: POSITIVE — AB

## 2016-03-04 LAB — GLUCOSE, CAPILLARY
GLUCOSE-CAPILLARY: 238 mg/dL — AB (ref 65–99)
GLUCOSE-CAPILLARY: 265 mg/dL — AB (ref 65–99)
GLUCOSE-CAPILLARY: 277 mg/dL — AB (ref 65–99)
GLUCOSE-CAPILLARY: 307 mg/dL — AB (ref 65–99)
GLUCOSE-CAPILLARY: 359 mg/dL — AB (ref 65–99)
GLUCOSE-CAPILLARY: 568 mg/dL — AB (ref 65–99)
Glucose-Capillary: 444 mg/dL — ABNORMAL HIGH (ref 65–99)
Glucose-Capillary: 347 mg/dL — ABNORMAL HIGH (ref 65–99)
Glucose-Capillary: 458 mg/dL — ABNORMAL HIGH (ref 65–99)

## 2016-03-04 LAB — TROPONIN I

## 2016-03-04 LAB — BLOOD GAS, VENOUS
ACID-BASE EXCESS: 6.2 mmol/L — AB (ref 0.0–2.0)
BICARBONATE: 34.9 mmol/L — AB (ref 20.0–28.0)
PATIENT TEMPERATURE: 37
PCO2 VEN: 71 mmHg — AB (ref 44.0–60.0)
PH VEN: 7.3 (ref 7.250–7.430)

## 2016-03-04 LAB — AMMONIA: AMMONIA: 22 umol/L (ref 9–35)

## 2016-03-04 LAB — LACTIC ACID, PLASMA: Lactic Acid, Venous: 1.4 mmol/L (ref 0.5–1.9)

## 2016-03-04 LAB — PROCALCITONIN

## 2016-03-04 LAB — MRSA PCR SCREENING: MRSA BY PCR: NEGATIVE

## 2016-03-04 MED ORDER — LACTATED RINGERS IV SOLN
INTRAVENOUS | Status: DC
  Administered 2016-03-04: 12:00:00 via INTRAVENOUS

## 2016-03-04 MED ORDER — DOXYCYCLINE HYCLATE 100 MG IV SOLR
100.0000 mg | Freq: Two times a day (BID) | INTRAVENOUS | Status: DC
  Administered 2016-03-04 – 2016-03-05 (×3): 100 mg via INTRAVENOUS
  Filled 2016-03-04 (×4): qty 100

## 2016-03-04 MED ORDER — ASPIRIN 300 MG RE SUPP
300.0000 mg | RECTAL | Status: AC
  Administered 2016-03-04: 300 mg via RECTAL
  Filled 2016-03-04: qty 1

## 2016-03-04 MED ORDER — GABAPENTIN 300 MG PO CAPS
600.0000 mg | ORAL_CAPSULE | Freq: Two times a day (BID) | ORAL | Status: DC
  Administered 2016-03-04 – 2016-03-06 (×4): 600 mg via ORAL
  Filled 2016-03-04 (×4): qty 2

## 2016-03-04 MED ORDER — INSULIN GLARGINE 100 UNIT/ML ~~LOC~~ SOLN
20.0000 [IU] | Freq: Every day | SUBCUTANEOUS | Status: DC
  Administered 2016-03-04: 20 [IU] via SUBCUTANEOUS
  Filled 2016-03-04 (×2): qty 0.2

## 2016-03-04 MED ORDER — IPRATROPIUM-ALBUTEROL 0.5-2.5 (3) MG/3ML IN SOLN: 3.0000 mL | Freq: Four times a day (QID) | RESPIRATORY_TRACT | Status: DC | PRN

## 2016-03-04 MED ORDER — METOPROLOL TARTRATE 25 MG PO TABS
25.0000 mg | ORAL_TABLET | Freq: Two times a day (BID) | ORAL | Status: DC
  Administered 2016-03-04 – 2016-03-06 (×4): 25 mg via ORAL
  Filled 2016-03-04 (×4): qty 1

## 2016-03-04 MED ORDER — SODIUM CHLORIDE 0.9 % IV SOLN
INTRAVENOUS | Status: DC
  Administered 2016-03-04: 06:00:00 via INTRAVENOUS

## 2016-03-04 MED ORDER — AMLODIPINE BESYLATE 5 MG PO TABS: 10.0000 mg | ORAL_TABLET | Freq: Every day | ORAL | Status: DC

## 2016-03-04 MED ORDER — SODIUM CHLORIDE 0.9 % IV BOLUS (SEPSIS)
1000.0000 mL | Freq: Once | INTRAVENOUS | Status: AC
  Administered 2016-03-04: 1000 mL via INTRAVENOUS

## 2016-03-04 MED ORDER — INSULIN ASPART 100 UNIT/ML ~~LOC~~ SOLN
7.0000 [IU] | Freq: Once | SUBCUTANEOUS | Status: AC
  Administered 2016-03-04: 7 [IU] via SUBCUTANEOUS

## 2016-03-04 MED ORDER — INSULIN ASPART 100 UNIT/ML ~~LOC~~ SOLN
3.0000 [IU] | Freq: Three times a day (TID) | SUBCUTANEOUS | Status: DC
  Administered 2016-03-04 – 2016-03-05 (×3): 3 [IU] via SUBCUTANEOUS
  Filled 2016-03-04 (×3): qty 3

## 2016-03-04 MED ORDER — METHYLPREDNISOLONE SODIUM SUCC 40 MG IJ SOLR
40.0000 mg | Freq: Two times a day (BID) | INTRAMUSCULAR | Status: DC
  Administered 2016-03-04: 40 mg via INTRAVENOUS
  Filled 2016-03-04: qty 1

## 2016-03-04 MED ORDER — BUDESONIDE 0.25 MG/2ML IN SUSP
0.2500 mg | Freq: Two times a day (BID) | RESPIRATORY_TRACT | Status: DC
  Administered 2016-03-04: 0.25 mg via RESPIRATORY_TRACT
  Filled 2016-03-04: qty 2

## 2016-03-04 MED ORDER — INSULIN ASPART 100 UNIT/ML ~~LOC~~ SOLN
0.0000 [IU] | Freq: Three times a day (TID) | SUBCUTANEOUS | Status: DC
  Administered 2016-03-04: 15 [IU] via SUBCUTANEOUS
  Administered 2016-03-05: 8 [IU] via SUBCUTANEOUS
  Administered 2016-03-05: 3 [IU] via SUBCUTANEOUS
  Administered 2016-03-05 – 2016-03-06 (×2): 8 [IU] via SUBCUTANEOUS
  Administered 2016-03-06: 5 [IU] via SUBCUTANEOUS
  Filled 2016-03-04: qty 5
  Filled 2016-03-04: qty 8
  Filled 2016-03-04: qty 3
  Filled 2016-03-04: qty 15
  Filled 2016-03-04 (×2): qty 8

## 2016-03-04 MED ORDER — INSULIN ASPART 100 UNIT/ML ~~LOC~~ SOLN: 0.0000 [IU] | SUBCUTANEOUS | Status: DC

## 2016-03-04 MED ORDER — ONDANSETRON HCL 4 MG/2ML IJ SOLN: 4.0000 mg | Freq: Four times a day (QID) | INTRAMUSCULAR | Status: DC | PRN

## 2016-03-04 MED ORDER — INSULIN GLARGINE 100 UNIT/ML ~~LOC~~ SOLN
20.0000 [IU] | Freq: Every day | SUBCUTANEOUS | Status: DC
  Filled 2016-03-04: qty 0.2

## 2016-03-04 MED ORDER — AMITRIPTYLINE HCL 10 MG PO TABS: 25.0000 mg | ORAL_TABLET | Freq: Three times a day (TID) | ORAL | Status: DC

## 2016-03-04 MED ORDER — INSULIN ASPART 100 UNIT/ML ~~LOC~~ SOLN
8.0000 [IU] | Freq: Once | SUBCUTANEOUS | Status: AC
  Administered 2016-03-04: 8 [IU] via INTRAVENOUS
  Filled 2016-03-04: qty 8

## 2016-03-04 MED ORDER — HEPARIN SODIUM (PORCINE) 5000 UNIT/ML IJ SOLN
5000.0000 [IU] | Freq: Three times a day (TID) | INTRAMUSCULAR | Status: DC
  Administered 2016-03-04 – 2016-03-05 (×4): 5000 [IU] via SUBCUTANEOUS
  Filled 2016-03-04 (×4): qty 1

## 2016-03-04 MED ORDER — IPRATROPIUM-ALBUTEROL 0.5-2.5 (3) MG/3ML IN SOLN
3.0000 mL | Freq: Four times a day (QID) | RESPIRATORY_TRACT | Status: DC
  Administered 2016-03-04 (×2): 3 mL via RESPIRATORY_TRACT
  Filled 2016-03-04 (×5): qty 3

## 2016-03-04 MED ORDER — INSULIN ASPART 100 UNIT/ML ~~LOC~~ SOLN
0.0000 [IU] | Freq: Every day | SUBCUTANEOUS | Status: DC
  Administered 2016-03-04: 4 [IU] via SUBCUTANEOUS
  Administered 2016-03-05: 3 [IU] via SUBCUTANEOUS
  Filled 2016-03-04: qty 11
  Filled 2016-03-04: qty 3

## 2016-03-04 MED ORDER — ALPRAZOLAM 0.5 MG PO TABS
0.5000 mg | ORAL_TABLET | Freq: Three times a day (TID) | ORAL | Status: DC | PRN
  Administered 2016-03-04 – 2016-03-06 (×5): 0.5 mg via ORAL
  Filled 2016-03-04 (×5): qty 1

## 2016-03-04 MED ORDER — BUDESONIDE 0.5 MG/2ML IN SUSP
0.5000 mg | Freq: Two times a day (BID) | RESPIRATORY_TRACT | Status: DC
  Administered 2016-03-05 – 2016-03-06 (×2): 0.5 mg via RESPIRATORY_TRACT
  Filled 2016-03-04 (×4): qty 2

## 2016-03-04 MED ORDER — ASPIRIN EC 81 MG PO TBEC
81.0000 mg | DELAYED_RELEASE_TABLET | Freq: Every day | ORAL | Status: DC
  Administered 2016-03-05 – 2016-03-06 (×2): 81 mg via ORAL
  Filled 2016-03-04 (×2): qty 1

## 2016-03-04 MED ORDER — INSULIN ASPART 100 UNIT/ML ~~LOC~~ SOLN
0.0000 [IU] | SUBCUTANEOUS | Status: DC
  Administered 2016-03-04 (×2): 11 [IU] via SUBCUTANEOUS
  Administered 2016-03-04: 8 [IU] via SUBCUTANEOUS
  Filled 2016-03-04: qty 15
  Filled 2016-03-04: qty 11
  Filled 2016-03-04: qty 8

## 2016-03-04 MED ORDER — ASPIRIN 81 MG PO CHEW: 324.0000 mg | CHEWABLE_TABLET | ORAL | Status: AC

## 2016-03-04 MED ORDER — ATORVASTATIN CALCIUM 20 MG PO TABS
40.0000 mg | ORAL_TABLET | Freq: Every day | ORAL | Status: DC
  Administered 2016-03-04 – 2016-03-05 (×2): 40 mg via ORAL
  Filled 2016-03-04 (×2): qty 2

## 2016-03-04 MED ORDER — OXYCODONE-ACETAMINOPHEN 5-325 MG PO TABS
1.0000 | ORAL_TABLET | Freq: Four times a day (QID) | ORAL | Status: DC | PRN
  Administered 2016-03-04 – 2016-03-06 (×5): 1 via ORAL
  Filled 2016-03-04 (×5): qty 1

## 2016-03-04 NOTE — ED Notes (Signed)
Lab tech Damoneni, notified of unsuccessful attempt to draw remaining blood samples, states will send lab tech to attempt blood draw.

## 2016-03-04 NOTE — Progress Notes (Signed)
eLink Physician-Brief Progress Note Patient Name: Teresa BastDeborah W Fields DOB: 1961-01-12 MRN: [id number]   Date of Service  03/04/2016  HPI/Events of Note  Increased FSBS >400  eICU Interventions  On SSI and Lantus 20 units     Intervention Category Intermediate Interventions: Hyperglycemia - evaluation and treatment  Aunica Dauphinee 03/04/2016, 5:10 PM

## 2016-03-04 NOTE — ED Provider Notes (Signed)
[facility] Emergency Department Provider Note   First MD Initiated Contact with Patient 03/04/16 0104     (approximate)  I have reviewed the triage vital signs and the nursing notes.   HISTORY  Chief Complaint Hyperglycemia    HPI [name] is a 56 y.o. female with bolus of chronic medical conditions including diabetes presents via EMS with history of infertility very lethargic by the patient's daughter. Patient states that she's had flulike symptoms in the past 3-4 days including cough chills vomiting and generalized myalgias. Patient noted to be hyperglycemic file EMS with glucose greater than 600.   Past Medical History:  Diagnosis Date  . Anxiety   . Asthma   . Coarse tremors   . COPD (chronic obstructive pulmonary disease) (HCC)    on 3L home o2  . CVA (cerebral vascular accident) (HCC)   . 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 Hyperlipidemia   . Hypertension   . Insomnia     Patient Active Problem List   Diagnosis Date Noted  . Stroke (HCC) 12/19/2015  . Diabetes (HCC) 12/19/2015  . Anxiety 08/31/2015  . Depression 08/31/2015  . Moderate recurrent major depression (HCC) 08/31/2015  . Grief at loss of child 07/23/2015  . Acute respiratory failure (HCC)   . COPD (chronic obstructive pulmonary disease) (HCC) 07/17/2015    Past Surgical History:  Procedure Laterality Date  . BACK SURGERY     x 2 lumbar spine  . BRONCHOSCOPY    . CERVICAL SPINE SURGERY    . CHOLECYSTECTOMY    . FOOT SURGERY      Prior to Admission medications   Medication Sig Start Date End Date Taking? Authorizing Provider  albuterol (PROVENTIL) (2.5 MG/3ML) 0.083% nebulizer solution Take 2.5 mg by nebulization every 6 (six) hours as needed for wheezing or shortness of breath.    Historical Provider, MD  albuterol-ipratropium (COMBIVENT) 18-103 MCG/ACT inhaler Inhale 1 puff into the lungs every 6 (six) hours as needed for  wheezing or shortness of breath.    Historical Provider, MD  ALPRAZolam Prudy Feeler(XANAX) 1 MG tablet Take 1 tablet (1 mg total) by mouth every 8 (eight) hours as needed for anxiety. 01/11/16   Auburn BilberryShreyang Patel, MD  amitriptyline (ELAVIL) 25 MG tablet Take 25 mg by mouth 3 (three) times daily.    Historical Provider, MD  amLODipine (NORVASC) 10 MG tablet Take 10 mg by mouth daily.    Historical Provider, MD  aspirin EC 325 MG EC tablet Take 1 tablet (325 mg total) by mouth daily. Patient not taking: Reported on 01/09/2016 12/22/15   Katha HammingSnehalatha Konidena, MD  aspirin EC 81 MG tablet Take by mouth daily.    Historical Provider, MD  atorvastatin (LIPITOR) 40 MG tablet Take 40 mg by mouth at bedtime.    Historical Provider, MD  fenofibrate 160 MG tablet Take 1 tablet (160 mg total) by mouth daily. 01/12/16   Auburn BilberryShreyang Patel, MD  fluticasone (FLONASE) 50 MCG/ACT nasal spray Place 2 sprays into both nostrils daily as needed for rhinitis.    Historical Provider, MD  Fluticasone-Salmeterol (ADVAIR) 250-50 MCG/DOSE AEPB Inhale 1 puff into the lungs 2 (two) times daily.    Historical Provider, MD  gabapentin (NEURONTIN) 300 MG capsule Take 2 capsules (600 mg total) by mouth 2 (two) times daily. Patient taking differently: Take 600 mg by mouth 3 (three) times daily.  12/21/15   Katha HammingSnehalatha Konidena, MD  insulin aspart (NOVOLOG) 100  UNIT/ML injection Inject 0-5 Units into the skin at bedtime. Patient taking differently: Inject 21 Units into the skin 3 (three) times daily with meals.  12/21/15   Katha Hamming, MD  insulin glargine (LANTUS) 100 unit/mL SOPN Inject 0.2 mLs (20 Units total) into the skin at bedtime. Patient taking differently: Inject 72 Units into the skin at bedtime.  12/21/15   Katha Hamming, MD  lisinopril (PRINIVIL,ZESTRIL) 2.5 MG tablet Take 2.5 mg by mouth daily.    Historical Provider, MD  magnesium oxide (MAG-OX) 400 (241.3 Mg) MG tablet Take 400 mg by mouth 2 (two) times daily.     Historical  Provider, MD  Melatonin 3 MG TABS Take 1 tablet by mouth at bedtime.    Historical Provider, MD  metoprolol tartrate (LOPRESSOR) 25 MG tablet Take 25 mg by mouth 2 (two) times daily.    Historical Provider, MD  oxyCODONE-acetaminophen (PERCOCET) 7.5-325 MG tablet Take 1 tablet by mouth every 12 (twelve) hours as needed for severe pain. Every 4-6 hours 01/11/16   Auburn Bilberry, MD  tiotropium (SPIRIVA) 18 MCG inhalation capsule Place 18 mcg into inhaler and inhale daily.    Historical Provider, MD  traZODone (DESYREL) 100 MG tablet Take 100 mg by mouth at bedtime as needed for sleep.     Historical Provider, MD  vortioxetine HBr (TRINTELLIX) 10 MG TABS Take 1 tablet by mouth daily.    Historical Provider, MD    Allergies Lyrica [pregabalin]; Penicillins; Prozac [fluoxetine hcl]; and Vicodin [hydrocodone-acetaminophen]  Family History  Problem Relation Age of Onset  . CAD Mother   . Hypertension Mother   . Throat cancer Father     Social History Social History  Substance Use Topics  . Smoking status: Current Every Day Smoker    Packs/day: 0.50    Types: Cigarettes  . Smokeless tobacco: Never Used  . Alcohol use No    Review of Systems Constitutional: Positive for chills Eyes: No visual changes. ENT: No sore throat. Cardiovascular: Denies chest pain. Respiratory: Denies shortness of breath. Gastrointestinal: No abdominal pain.  No nausea,Positive for vomiting  Genitourinary: Negative for dysuria. Musculoskeletal: Negative for back pain.Positive for generalized muscle aches Skin: Negative for rash. Neurological: Negative for headaches, focal weakness or numbness.  10-point ROS otherwise negative.  ____________________________________________   PHYSICAL EXAM:  VITAL SIGNS: ED Triage Vitals  Enc Vitals Group     BP 03/04/16 0111 123/83     Pulse Rate 03/04/16 0111 75     Resp 03/04/16 0111 17     Temp 03/04/16 0111 97.8 F (36.6 C)     Temp Source 03/04/16 0111 Oral       SpO2 03/04/16 0111 (!) 89 %     Weight 03/04/16 0111 163 lb (73.9 kg)     Height 03/04/16 0111 5\' 3"  (1.6 m)     Head Circumference --      Peak Flow --      Pain Score 03/04/16 0112 6     Pain Loc --      Pain Edu? --      Excl. in GC? --     Constitutional: Somnolent but arousable, lethargic Eyes: Conjunctivae are normal. PERRL. EOMI. Head: Atraumatic. Mouth/Throat: Mucous membranes are dry.  Oropharynx non-erythematous. Neck: No stridor.  No meningeal signs.   Cardiovascular: Normal rate, regular rhythm. Good peripheral circulation. Grossly normal heart sounds. Respiratory: Tachypnea  No retractions. Lungs CTAB. Gastrointestinal: Soft and nontender. No distention.   Musculoskeletal: No lower extremity tenderness  nor edema. No gross deformities of extremities. Neurologic:  Normal speech and language. No gross focal neurologic deficits are appreciated.  Skin:  Skin is warm, dry and intact. No rash noted.   ____________________________________________   LABS (all labs ordered are listed, but only abnormal results are displayed)  Labs Reviewed  COMPREHENSIVE METABOLIC PANEL - Abnormal; Notable for the following:       Result Value   Sodium 133 (*)    Potassium 5.2 (*)    Chloride 97 (*)    Glucose, Bld 620 (*)    BUN 24 (*)    Creatinine, Ser 1.44 (*)    Albumin 3.3 (*)    ALT 11 (*)    Alkaline Phosphatase 133 (*)    GFR calc non Af Amer 40 (*)    GFR calc Af Amer 46 (*)    All other components within normal limits  BLOOD GAS, VENOUS - Abnormal; Notable for the following:    pCO2, Ven 71 (*)    Bicarbonate 34.9 (*)    Acid-Base Excess 6.2 (*)    All other components within normal limits  GLUCOSE, CAPILLARY - Abnormal; Notable for the following:    Glucose-Capillary 568 (*)    All other components within normal limits  GLUCOSE, CAPILLARY - Abnormal; Notable for the following:    Glucose-Capillary 444 (*)    All other components within normal limits  CBC   URINALYSIS, COMPLETE (UACMP) WITH MICROSCOPIC     RADIOLOGY I, [place] N BROWN, personally viewed and evaluated these images (plain radiographs) as part of my medical decision making, as well as reviewing the written report by the radiologist.  Dg Chest Portable 1 View  Result Date: 03/04/2016 CLINICAL DATA:  Cough and fever tonight. EXAM: PORTABLE CHEST 1 VIEW COMPARISON:  01/10/2016 FINDINGS: A single AP portable view of the chest demonstrates no focal airspace consolidation or alveolar edema. The lungs are grossly clear. There is no large effusion or pneumothorax. Cardiac and mediastinal contours appear unremarkable. IMPRESSION: No active disease. Electronically Signed   By: Ellery Plunk M.D.   On: 03/04/2016 02:14    _______ Procedures  Critical care: CRITICAL CARE Performed by: Darci Current   Total critical care time: 30 minutes  Critical care time was exclusive of separately billable procedures and treating other patients.  Critical care was necessary to treat or prevent imminent or life-threatening deterioration.  Critical care was time spent personally by me on the following activities: development of treatment plan with patient and/or surrogate as well as nursing, discussions with consultants, evaluation of patient's response to treatment, examination of patient, obtaining history from patient or surrogate, ordering and performing treatments and interventions, ordering and review of laboratory studies, ordering and review of radiographic studies, pulse oximetry and re-evaluation of patient's condition.   INITIAL IMPRESSION / ASSESSMENT AND PLAN / ED COURSE  Pertinent labs & imaging results that were available during my care of the patient were reviewed by me and considered in my medical decision making (see chart for details).   She noted to be markedly hyperglycemic and a such received 2 L IV normal saline as well as insulin with improvement of glucose current  glucose 347. Patient's CO2 noted to be 71 and blood gas with bicarbonate of 34.9 raising concern for possible DKA however patient anion gap reported as 7. BiPAP applied secondary to patient's tachypnea and hypercarbia with somnolence. Patient discussed with Dr. Frederica Kuster admission for further evaluation and management  ____________________________________________  FINAL CLINICAL IMPRESSION(S) / ED DIAGNOSES  Final diagnoses:  Hyperglycemia  Hypercarbia  Encephalopathy     MEDICATIONS GIVEN DURING THIS VISIT:  Medications  sodium chloride 0.9 % bolus 1,000 mL (1,000 mLs Intravenous New Bag/Given 03/04/16 0145)  sodium chloride 0.9 % bolus 1,000 mL (1,000 mLs Intravenous New Bag/Given 03/04/16 0123)  insulin aspart (novoLOG) injection 8 Units (8 Units Intravenous Given 03/04/16 0250)     NEW OUTPATIENT MEDICATIONS STARTED DURING THIS VISIT:  New Prescriptions   No medications on file    Modified Medications   No medications on file    Discontinued Medications   No medications on file     Note:  This document was prepared using Dragon voice recognition software and may include unintentional dictation errors.    Darci Current, MD 03/04/16 [id number]

## 2016-03-04 NOTE — ED Notes (Signed)
Pt bib EMS from home w/ c/o hyperglycemia.  Pt was found by daughter, who reported that pt was acting lethargic.  Pt drowsy in triage, nodding off but will respond to voice and answer questions. PERRLA.  Pt sts that she has had flu like s/s x 3-4 days.  Pt reports vomiting.

## 2016-03-04 NOTE — Plan of Care (Signed)
MD made aware pt has had no urine output since admission to icu

## 2016-03-04 NOTE — ED Notes (Addendum)
[facility]

## 2016-03-04 NOTE — Progress Notes (Signed)
   03/04/16 1800  Unmeasured Output  Urine Occurrence 0  Stool Occurrence 0    MD aware and is going to monitor the labs closely

## 2016-03-04 NOTE — Plan of Care (Signed)
Results for [name], Dennisse W (MRN [id number]) as of 03/04/2016 16:05  Ref. Range 03/04/2016 15:53  Glucose-Capillary Latest Ref Range: 65 - 99 mg/dL 409458 (H)    MD paged

## 2016-03-04 NOTE — ED Notes (Signed)
Dr. Sheryle Hailiamond notified of pt CBG 359. No new orders provided at this time. Pt resting, respirations even and unlabored.

## 2016-03-04 NOTE — H&P (Signed)
PULMONARY / CRITICAL CARE MEDICINE   Name: [name] MRN: [id number] DOB: 1960-02-06    ADMISSION DATE:  03/04/2016 CONSULTATION DATE:  03/04/16  REFERRING MD:  Dr. Sheryle Hail  CHIEF COMPLAINT:  Hyperglycemia  HISTORY OF PRESENT ILLNESS:   This is a 56 yo female with a PMH of Insomnia, HTN, Hyperlipidemia, Diabetes Mellitus, CVA, COPD, Home O2 3L, Current everyday smoker (0.5 PPD), Coarse Tremors, Asthma, and Anxiety.  She presented to [facility] ER 01/30 with hyperglycemia and lethargy.  Per ER notes EMS was notified when the pts daughter found her mother acting lethargic.  She stated her mother has been having flu like s/sx for the past 3-4 days and reports the pt has been vomiting, cough, chills, and generalized myalgias.  In the ER laboratory results revealed the pt was hyperglycemic with a serum glucose of 620, anion gap 7, and the pt was hypercapnic CO2 71.  Due to worsening acute encephalopathy she was placed on continuous Bipap.  Pt given 2L NS fluids bolus and 8 units of IV insulin with improvement of glucose 347.  PCCM contacted 01/30 to admit the pt to ICU due to acute on chronic hypercapnic respiratory failure secondary to AECOPD from ?Influenza vs. Upper respiratory Infection requiring continuous Bipap, Acute encephalopathy, and Hyperglycemia.  PAST MEDICAL HISTORY :  She  has a past medical history of Anxiety; Asthma; Coarse tremors; COPD (chronic obstructive pulmonary disease) (HCC); CVA (cerebral vascular accident) (HCC); Diabetes mellitus without complication (HCC); Hyperlipidemia; Hypertension; and Insomnia.  PAST SURGICAL HISTORY: She  has a past surgical history that includes Back surgery; Foot surgery; Bronchoscopy; Cervical spine surgery; and Cholecystectomy.  Allergies  Allergen Reactions  . Lyrica [Pregabalin] Other (See Comments)    hallucination  . Penicillins   . Prozac [Fluoxetine Hcl] Other (See Comments)    hallucinations  . Vicodin [Hydrocodone-Acetaminophen]  Rash    No current facility-administered medications on file prior to encounter.    Current Outpatient Prescriptions on File Prior to Encounter  Medication Sig  . albuterol (PROVENTIL) (2.5 MG/3ML) 0.083% nebulizer solution Take 2.5 mg by nebulization every 6 (six) hours as needed for wheezing or shortness of breath.  Marland Kitchen albuterol-ipratropium (COMBIVENT) 18-103 MCG/ACT inhaler Inhale 1 puff into the lungs every 6 (six) hours as needed for wheezing or shortness of breath.  . ALPRAZolam (XANAX) 1 MG tablet Take 1 tablet (1 mg total) by mouth every 8 (eight) hours as needed for anxiety.  Marland Kitchen amitriptyline (ELAVIL) 25 MG tablet Take 25 mg by mouth 3 (three) times daily.  Marland Kitchen amLODipine (NORVASC) 10 MG tablet Take 10 mg by mouth daily.  Marland Kitchen aspirin EC 325 MG EC tablet Take 1 tablet (325 mg total) by mouth daily. (Patient not taking: Reported on 01/09/2016)  . aspirin EC 81 MG tablet Take by mouth daily.  Marland Kitchen atorvastatin (LIPITOR) 40 MG tablet Take 40 mg by mouth at bedtime.  . fenofibrate 160 MG tablet Take 1 tablet (160 mg total) by mouth daily.  . fluticasone (FLONASE) 50 MCG/ACT nasal spray Place 2 sprays into both nostrils daily as needed for rhinitis.  . Fluticasone-Salmeterol (ADVAIR) 250-50 MCG/DOSE AEPB Inhale 1 puff into the lungs 2 (two) times daily.  Marland Kitchen gabapentin (NEURONTIN) 300 MG capsule Take 2 capsules (600 mg total) by mouth 2 (two) times daily. (Patient taking differently: Take 600 mg by mouth 3 (three) times daily. )  . insulin aspart (NOVOLOG) 100 UNIT/ML injection Inject 0-5 Units into the skin at bedtime. (Patient taking differently: Inject 21  Units into the skin 3 (three) times daily with meals. )  . insulin glargine (LANTUS) 100 unit/mL SOPN Inject 0.2 mLs (20 Units total) into the skin at bedtime. (Patient taking differently: Inject 72 Units into the skin at bedtime. )  . lisinopril (PRINIVIL,ZESTRIL) 2.5 MG tablet Take 2.5 mg by mouth daily.  . magnesium oxide (MAG-OX) 400 (241.3 Mg)  MG tablet Take 400 mg by mouth 2 (two) times daily.   . Melatonin 3 MG TABS Take 1 tablet by mouth at bedtime.  . metoprolol tartrate (LOPRESSOR) 25 MG tablet Take 25 mg by mouth 2 (two) times daily.  Marland Kitchen. oxyCODONE-acetaminophen (PERCOCET) 7.5-325 MG tablet Take 1 tablet by mouth every 12 (twelve) hours as needed for severe pain. Every 4-6 hours  . tiotropium (SPIRIVA) 18 MCG inhalation capsule Place 18 mcg into inhaler and inhale daily.  . traZODone (DESYREL) 100 MG tablet Take 100 mg by mouth at bedtime as needed for sleep.   Marland Kitchen. vortioxetine HBr (TRINTELLIX) 10 MG TABS Take 1 tablet by mouth daily.    FAMILY HISTORY:  Her indicated that her mother is deceased. She indicated that her father is deceased.    SOCIAL HISTORY: She  reports that she has been smoking Cigarettes.  She has been smoking about 0.50 packs per day. She has never used smokeless tobacco. She reports that she does not drink alcohol or use drugs.  REVIEW OF SYSTEMS:   Unable to assess pt on Bipap and lethargic   SUBJECTIVE:  Unable to assess pt on Bipap and lethargic   VITAL SIGNS: BP 140/87   Pulse 74   Temp 97.8 F (36.6 C) (Oral)   Resp 20   Ht 5\' 3"  (1.6 m)   Wt 73.9 kg (163 lb)   SpO2 96%   BMI 28.87 kg/m   HEMODYNAMICS:    VENTILATOR SETTINGS:    INTAKE / OUTPUT: No intake/output data recorded.  PHYSICAL EXAMINATION: General: acutely ill appearing Caucasian female Neuro: lethargic, opens eyes to pain, does not follow commands, PERRL HEENT: supple, no JVD Cardiovascular: nsr, s1s2, no M/R/G Lungs: diminished and expiratory wheezes throughout, mildly labored on Bipap Abdomen: +BS x4, soft, non tender, non distended Musculoskeletal: normal bulk and tone, no edema Skin: intact no rashes or lesions  LABS:  BMET  Recent Labs Lab 03/04/16 0119  NA 133*  K 5.2*  CL 97*  CO2 29  BUN 24*  CREATININE 1.44*  GLUCOSE 620*    Electrolytes  Recent Labs Lab 03/04/16 0119  CALCIUM 8.9     CBC  Recent Labs Lab 03/04/16 0119  WBC 8.8  HGB 12.4  HCT 37.1  PLT 234    Coag's No results for input(s): APTT, INR in the last 168 hours.  Sepsis Markers No results for input(s): LATICACIDVEN, PROCALCITON, O2SATVEN in the last 168 hours.  ABG No results for input(s): PHART, PCO2ART, PO2ART in the last 168 hours.  Liver Enzymes  Recent Labs Lab 03/04/16 0119  AST 20  ALT 11*  ALKPHOS 133*  BILITOT 1.0  ALBUMIN 3.3*    Cardiac Enzymes No results for input(s): TROPONINI, PROBNP in the last 168 hours.  Glucose  Recent Labs Lab 03/04/16 0109 03/04/16 0249 03/04/16 0338  GLUCAP 568* 444* 359*    Imaging Dg Chest Portable 1 View  Result Date: 03/04/2016 CLINICAL DATA:  Cough and fever tonight. EXAM: PORTABLE CHEST 1 VIEW COMPARISON:  01/10/2016 FINDINGS: A single AP portable view of the chest demonstrates no focal airspace consolidation or  alveolar edema. The lungs are grossly clear. There is no large effusion or pneumothorax. Cardiac and mediastinal contours appear unremarkable. IMPRESSION: No active disease. Electronically Signed   By: Ellery Plunk M.D.   On: 03/04/2016 02:14   STUDIES:  CT of Head 01/30>>  CULTURES: Blood x2 01/30>>  ANTIBIOTICS: Doxycycline 01/30>>  SIGNIFICANT EVENTS: 01/30-Pt admitted to [facility] ICU with acute on chronic hypercapnic respiratory failure secondary to AECOPD from ?Influenza vs. Upper respiratory infection requiring continuous bipap, Acute encephalopathy, and Hyperglycemia.  LINES/TUBES: PIV's x2>>  ASSESSMENT / PLAN:  PULMONARY A: Acute on chronic hypercapnic respiratory failure secondary to AECOPD from ?Influenza vs. URI Hx: Asthma P:   Continuous Bipap for now wean as tolerated Maintain O2 sats 88% to 92% Aggressive bronchodilator therapy IV steroids Intermittent CXR Prn ABG's  Smoking Cessation counseling once pt alert and oriented  CARDIOVASCULAR A:  No acute issues  Hx: HTN,  Hyperlipidemia, and CVA P:  Continuous telemetry monitoring  Trend troponin's Start outpatient amlodipine, atorvastatin, and aspirin Will hold outpatient lisinopril for now  RENAL A:   Acute renal failure Mild hyperkalemia Hyponatremia P:   Trend BMP's Replace electrolytes as indicated Monitor UOP NS @100  ml/hr  GASTROINTESTINAL A:   No acute issues P:   Keep NPO for now will start diet once off bipap and more alert  HEMATOLOGIC A:   No acute issues P:  Subq heparin for VTE prophylaxis Trend CBC's Monitor for s/sx of bleeding Transfuse for hgb <7  INFECTIOUS A:   ?Influenza vs. URI P:   Trend WBC and monitor fever curve Trend PCT's  Obtain influenza pcr Will start tamiflu if influenza positive Follow cultures  Doxycycline for 7 days  ENDOCRINE A:   Hyperglycemia Type II Diabetes Mellitus P:   CBG's q4hrs SSI for now if blood sugars continue to increase despite ssi will start insulin gtt  NEUROLOGIC A:   Acute encephalopathy likely secondary to hypercapnia  Hx: Anxiety and Coarse Tremors  P:   Start outpatient elavil and trintellix  Will hold outpatient gabapentin, percocet, and trazodone for now  Urine drug screen pending  Frequent reorientation Lights on during the day  Promote family presence at bedside CT of Head pending     FAMILY  - Updates: No family at bedside to update 03/04/2016  - Inter-disciplinary family meet or Palliative Care meeting due by:  03/11/2016   Sonda Rumble, AGNP  Pulmonary/Critical Care Pager [phone] (please enter 7 digits) PCCM Consult Pager [phone] (please enter 7 digits)  PCCM ATTENDING ATTESTATION:  I have evaluated patient with the APP [place], reviewed database in its entirety and discussed care plan in detail. In addition, this patient was discussed on multidisciplinary rounds.   Important exam findings: Lethargic Not wheezing On BiPAP No distress RRR s M NABS, soft No C/C/E  CXR  without acute findings  Major problems addressed by PCCM team: Acute hypercarbic respiratory failure - this appears to be more of a problem of ventilatory drive than one of excessive work of breathing  PLAN/REC: PRN BiPAP Supplemental O2 Nebulized steroids and bronchodilators Empiric antibiotics   Billy Fischer, MD PCCM service Mobile [phone] Pager [phone]

## 2016-03-04 NOTE — Progress Notes (Signed)
Inpatient Diabetes Program Recommendations  AACE/ADA: New Consensus Statement on Inpatient Glycemic Control (2015)  Target Ranges:  Prepandial:   less than 140 mg/dL      Peak postprandial:   less than 180 mg/dL (1-2 hours)      Critically ill patients:  140 - 180 mg/dL   Lab Results  Component Value Date   GLUCAP 458 (H) 03/04/2016   HGBA1C 11.4 (H) 01/10/2016    Review of Glycemic ControlResults for Teresa BastRILEY, [name] (MRN [id number]) as of 03/04/2016 16:13  Ref. Range 03/04/2016 03:38 03/04/2016 05:33 03/04/2016 08:27 03/04/2016 11:35 03/04/2016 15:53  Glucose-Capillary Latest Ref Range: 65 - 99 mg/dL 308359 (H) 657347 (H) 846277 (H) 265 (H) 458 (H)    Diabetes history: Type 2 diabetes Outpatient Diabetes medications: According to patient she states that she takes Lantus 36 units bid, Novolog 21 units tid with meals Current orders for Inpatient glycemic control:  Lantus 20 units q HS, Novolog moderate tid with meals and HS, Novolog 3 units tid with meals  Inpatient Diabetes Program Recommendations:    Spoke with patient.  She states that she is in pain and not feeling well at this time.  I inquired about her home medications as listed above.  Patient admits that she was not taking insulin prior to admission because she did not have an appetite.  Reminded her of importance of taking insulin.  Patient tearful and states that she wants to go home today.  Told her I would discuss with RN.  Discussed with pharmacy as well. Would benefit from getting Lantus dose this afternoon instead of tonight.    Will follow.  Thanks, Beryl MeagerJenny Lanny Donoso, RN, BC-ADM Inpatient Diabetes Coordinator Pager [phone] (8a-5p)

## 2016-03-05 ENCOUNTER — Inpatient Hospital Stay: Payer: Medicaid Other

## 2016-03-05 DIAGNOSIS — J441 Chronic obstructive pulmonary disease with (acute) exacerbation: Principal | ICD-10-CM

## 2016-03-05 DIAGNOSIS — E1165 Type 2 diabetes mellitus with hyperglycemia: Secondary | ICD-10-CM

## 2016-03-05 DIAGNOSIS — Z794 Long term (current) use of insulin: Secondary | ICD-10-CM

## 2016-03-05 LAB — CBC
HCT: 33.3 % — ABNORMAL LOW (ref 35.0–47.0)
HEMOGLOBIN: 11.6 g/dL — AB (ref 12.0–16.0)
MCH: 29.7 pg (ref 26.0–34.0)
MCHC: 34.8 g/dL (ref 32.0–36.0)
MCV: 85.4 fL (ref 80.0–100.0)
Platelets: 245 10*3/uL (ref 150–440)
RBC: 3.9 MIL/uL (ref 3.80–5.20)
RDW: 14.3 % (ref 11.5–14.5)
WBC: 9.2 10*3/uL (ref 3.6–11.0)

## 2016-03-05 LAB — GLUCOSE, CAPILLARY
GLUCOSE-CAPILLARY: 199 mg/dL — AB (ref 65–99)
GLUCOSE-CAPILLARY: 295 mg/dL — AB (ref 65–99)
Glucose-Capillary: 258 mg/dL — ABNORMAL HIGH (ref 65–99)
Glucose-Capillary: 282 mg/dL — ABNORMAL HIGH (ref 65–99)

## 2016-03-05 LAB — BASIC METABOLIC PANEL
Anion gap: 7 (ref 5–15)
BUN: 22 mg/dL — ABNORMAL HIGH (ref 6–20)
CALCIUM: 8.8 mg/dL — AB (ref 8.9–10.3)
CO2: 28 mmol/L (ref 22–32)
CREATININE: 0.64 mg/dL (ref 0.44–1.00)
Chloride: 101 mmol/L (ref 101–111)
GFR calc non Af Amer: >60 mL/min (ref >60)
Glucose, Bld: 192 mg/dL — ABNORMAL HIGH (ref 65–99)
Potassium: 4 mmol/L (ref 3.5–5.1)
SODIUM: 136 mmol/L (ref 135–145)

## 2016-03-05 LAB — PROCALCITONIN

## 2016-03-05 MED ORDER — INSULIN GLARGINE 100 UNIT/ML ~~LOC~~ SOLN: 40.0000 [IU] | Freq: Every day | SUBCUTANEOUS | Status: DC

## 2016-03-05 MED ORDER — ENOXAPARIN SODIUM 40 MG/0.4ML ~~LOC~~ SOLN
40.0000 mg | SUBCUTANEOUS | Status: DC
  Administered 2016-03-05: 40 mg via SUBCUTANEOUS
  Filled 2016-03-05: qty 0.4

## 2016-03-05 MED ORDER — DOXYCYCLINE HYCLATE 100 MG PO TABS
100.0000 mg | ORAL_TABLET | Freq: Two times a day (BID) | ORAL | Status: DC
  Administered 2016-03-05 – 2016-03-06 (×2): 100 mg via ORAL
  Filled 2016-03-05 (×2): qty 1

## 2016-03-05 MED ORDER — INSULIN GLARGINE 100 UNIT/ML ~~LOC~~ SOLN
25.0000 [IU] | Freq: Every day | SUBCUTANEOUS | Status: DC
  Administered 2016-03-05: 25 [IU] via SUBCUTANEOUS
  Filled 2016-03-05 (×2): qty 0.25

## 2016-03-05 MED ORDER — INSULIN ASPART 100 UNIT/ML ~~LOC~~ SOLN
6.0000 [IU] | Freq: Three times a day (TID) | SUBCUTANEOUS | Status: DC
Start: 2016-03-05 — End: 2016-03-06
  Administered 2016-03-05 – 2016-03-06 (×3): 6 [IU] via SUBCUTANEOUS
  Filled 2016-03-05 (×3): qty 6

## 2016-03-05 MED ORDER — INFLUENZA VAC SPLIT QUAD 0.5 ML IM SUSY
0.5000 mL | PREFILLED_SYRINGE | INTRAMUSCULAR | Status: AC
  Administered 2016-03-06: 0.5 mL via INTRAMUSCULAR
  Filled 2016-03-05: qty 0.5

## 2016-03-05 NOTE — Progress Notes (Signed)
Comfortable on Roland O2, off BiPAP. No new complaints  Vitals:   03/05/16 0600 03/05/16 0700 03/05/16 0800 03/05/16 1024  BP: (!) 173/75 (!) 164/67 (!) 152/131 (!) 152/60  Pulse:  74 74 67  Resp: 17 15 (!) 26 18  Temp:  98.1 F (36.7 C)  97.9 F (36.6 C)  TempSrc:  Oral  Oral  SpO2: 91% 96% 93% 100%  Weight:      Height:       NAD HEENT WNL No JVD No wheezes Reg, no M NABS Neuro intact  BMP Latest Ref Rng & Units 03/05/2016 03/04/2016 03/04/2016  Glucose 65 - 99 mg/dL 161(W192(H) 960(A367(H) 540(JW620(HH)  BUN 6 - 20 mg/dL 11(B22(H) 14(N21(H) 82(N24(H)  Creatinine 0.44 - 1.00 mg/dL 5.620.64 1.300.89 8.65(H1.44(H)  Sodium 135 - 145 mmol/L 136 139 133(L)  Potassium 3.5 - 5.1 mmol/L 4.0 4.1 5.2(H)  Chloride 101 - 111 mmol/L 101 104 97(L)  CO2 22 - 32 mmol/L 28 26 29   Calcium 8.9 - 10.3 mg/dL 8.4(O8.8(L) 8.9 8.9   CBC Latest Ref Rng & Units 03/05/2016 03/04/2016 01/09/2016  WBC 3.6 - 11.0 K/uL 9.2 8.8 5.7  Hemoglobin 12.0 - 16.0 g/dL 11.6(L) 12.4 11.9(L)  Hematocrit 35.0 - 47.0 % 33.3(L) 37.1 35.4  Platelets 150 - 440 K/uL 245 234 224   CXR: No definite infiltrates, no edema  IMPRESSION: 1) Acute on chronic hypercarbic respiratory failure due to COPD and suspect in part due to overuse of sedating medications (alprazolam and opiates) 2) Poorly controlled DM2  PLAN/REC: Transfer to med-surg floor Continue nebulized steroids and bronchodilators Change abx to PO doxy and complete 7 days of abx for AECOPD Cont Lantus and SSI - increase Lantus Transfer to med-surg and to Hospitalist Service  Billy Fischeravid Leston Schueller, MD PCCM service Mobile [phone] Pager [phone] 03/05/2016

## 2016-03-05 NOTE — Plan of Care (Signed)
POC reviewed with patient, cont on q2 turns, vital signs closely monitored and any concerns addressed at this time. Will continue to monitor.  BP (!) 152/131   Pulse 74   Temp 98.1 F (36.7 C) (Oral)   Resp (!) 26   Ht 5\' 6"  (1.676 m)   Wt 74.6 kg (164 lb 7.4 oz)   SpO2 93%   BMI 26.55 kg/m

## 2016-03-05 NOTE — Progress Notes (Signed)
PHARMACIST - PHYSICIAN COMMUNICATION DR:   Sung AmabileSimonds CONCERNING: Antibiotic IV to Oral Route Change Policy  RECOMMENDATION: This patient is receiving doxycycline by the intravenous route.  Based on criteria approved by the Pharmacy and Therapeutics Committee, the antibiotic is being converted to the equivalent oral dose form.   DESCRIPTION: These criteria include:  Patient being treated for a respiratory tract infection, urinary tract infection, cellulitis or clostridium difficile associated diarrhea if on metronidazole  The patient is not neutropenic and does not exhibit a GI malabsorption state  The patient is eating (either orally or via tube) and/or has been taking other orally administered medications for a least 24 hours  The patient is improving clinically and has a Tmax < 100.5  If you have questions about this conversion, please contact the Pharmacy Department   Delsa BernKelly m Fuhrmann, PharmD 11:16 AM 03/05/2016

## 2016-03-05 NOTE — Progress Notes (Addendum)
Inpatient Diabetes Program Recommendations  AACE/ADA: New Consensus Statement on Inpatient Glycemic Control (2015)  Target Ranges:  Prepandial:   less than 140 mg/dL      Peak postprandial:   less than 180 mg/dL (1-2 hours)      Critically ill patients:  140 - 180 mg/dL   Results for [name], [name] (MRN [id number]) as of 03/05/2016 09:19  Ref. Range 03/04/2016 05:33 03/04/2016 08:27 03/04/2016 11:35 03/04/2016 15:53 03/04/2016 21:20 03/04/2016 23:56 03/05/2016 07:14  Glucose-Capillary Latest Ref Range: 65 - 99 mg/dL 517347 (H) 616277 (H) 073265 (H) 458 (H) 307 (H) 238 (H) 258 (H)   Review of Glycemic Control   Current orders for Inpatient glycemic control: Lantus 20 units QHS, Novolog 0-15 units TID with meals, Novolog 0-5 units QHS, Novolog 3 units TID with meals for meal coverage  Inpatient Diabetes Program Recommendations:  Insulin - Basal: Please consider increasing Lantus to 25 units QHS. Insulin - Meal Coverage: Please consider increasing meal coverage Novolog to 6 units TID with meals.  Addendum 03/05/16@14 :45-Spoke with patient about diabetes and home regimen for diabetes control. Patient reports that she is followed by PCP for diabetes management and currently she takes Lantus 36 units BID and Novolog 21 units TID with big meals as an outpatient for diabetes control. Patient reports that she was finding that if she took Novolog 21 units with meals she was experiencing hypoglycemia so she decreases Novolog to 10 units if she is not eating a big meal. Patient admits that she had not taken any insulin in 2 days and she has not been following a Carb Modified diet. Patient states that "This time I got scared about my diabetes and I am going to get back on track and my daughter is going to help me."  Discussed glucose and A1C goals. Discussed importance of checking CBGs and maintaining good CBG control to prevent long-term and short-term complications. Explained how hyperglycemia leads to damage within blood  vessels which lead to the common complications seen with uncontrolled diabetes. Stressed to the patient the importance of improving glycemic control to prevent further complications from uncontrolled diabetes. Discussed impact of nutrition, exercise, stress, sickness, and medications on diabetes control. Patient states that she has already talked with RD today and she is going to make several changes with her diet.  Encouraged patient to check her glucose 4 times per day (before meals and at bedtime) and to keep a log book of glucose readings and insulin taken which she will need to take to doctor appointments. Explained how the doctor she follows up with can use the log book to continue to make insulin adjustments if needed. Patient verbalized understanding of information discussed and she states that she has no further questions at this time related to diabetes.  Thanks, Teresa PennerMarie Victorious Cosio, RN, MSN, CDE Diabetes Coordinator Inpatient Diabetes Program [phone] (Team Pager from 8am to 5pm)

## 2016-03-05 NOTE — Progress Notes (Signed)
Initial Nutrition Assessment  DOCUMENTATION CODES:   Not applicable  INTERVENTION:  -Send diabetic friendly snacks between meals -Cater to pt preferences  NUTRITION DIAGNOSIS:   Unintentional weight loss related to chronic illness as evidenced by percent weight loss, per patient/family report.  GOAL:   Patient will meet greater than or equal to 90% of their needs  MONITOR:   PO intake, Labs, Weight trends  REASON FOR ASSESSMENT:   Malnutrition Screening Tool    ASSESSMENT:   56 yo female admitted with acute on chronic respiratory failure due to COPD, suspect in part due to overuse of sedating meds. Pt also with poorly controlled DM  Pt with very good appetite, eating 100% of meals and reporting she is still hungry.  Pt reports some wt loss, 9 pounds in one week. Noted pt with 15 pound wt loss since June 2017.  Nutrition-Focused physical exam completed. Findings are WDL for fat depletion, muscle depletion, and edema.   Labs: FSBS 200s-400s Meds: lantus, ss novolog  Diet Order:  Diet heart healthy/carb modified Room service appropriate? Yes; Fluid consistency: Thin  Skin:  Reviewed, no issues  Last BM:  no documented BM  Height:   Ht Readings from Last 1 Encounters:  03/04/16 5\' 6"  (1.676 m)    Weight:   Wt Readings from Last 1 Encounters:  03/05/16 164 lb 7.4 oz (74.6 kg)    Filed Weights   03/04/16 0111 03/04/16 0800 03/05/16 0500  Weight: 163 lb (73.9 kg) 163 lb 5.8 oz (74.1 kg) 164 lb 7.4 oz (74.6 kg)    BMI:  Body mass index is 26.55 kg/m.  Estimated Nutritional Needs:   Kcal:  1800-2100 kcals  Protein:  90-105 g  Fluid:  >/= 1.8 L  EDUCATION NEEDS:   Education needs addressed  Romelle Starcherate Paiton Fosco MS, RD, LDN [phone] Pager  [phone] Weekend/On-Call Pager

## 2016-03-05 NOTE — Progress Notes (Signed)
03/05/16 0851  Charting Type  Charting Type Shift assessment  Orders Chart Check (once per shift) Completed  Neurological  Neuro (WDL) X  Level of Consciousness Responds to Voice  Orientation Level Oriented X4  Cognition Appropriate at baseline;Appropriate attention/concentration;Appropriate safety awareness;Appropriate judgement;Follows commands;Appropriate for developmental age  Speech Slurred/Dysarthria  Pupil Assessment  Yes  R Pupil Size (mm) 4  R Pupil Shape Round  R Pupil Reaction Brisk  L Pupil Size (mm) 4  L Pupil Shape Round  L Pupil Reaction Brisk  Additional Pupil Assessments No  Glasgow Coma Scale  Eye Opening 4  Best Verbal Response (NON-intubated) 5  Best Motor Response 6  Glasgow Coma Scale Score 15  Richmond Agitation Sedation Scale  Richmond Agitation Sedation Scale (RASS) 0  RASS Goal 0  CAM-ICU Feature 1:  Acute onset of fluctuating course  Is the patient different from his/her baseline mental status? Yes  Has the patient had any fluctuation in mental status in the past 24 hours? Yes  CAM-ICU Feature 2:  Inattention  Number of errors greater than 2? No  CAM-ICU Feature 4:  Disorganized thinking  CAM-ICU Negative? Yes  Delirium Prevention:  Universal Requirements (Complete for all ICU patients)  Universal Precautions Initiated *See Row Information* Yes  HEENT  HEENT (WDL) WDL  Vision Check No  Respiratory  Respiratory (WDL) X  Cough None  Bilateral Breath Sounds Diminished  Respiratory Pattern Regular;Unlabored  Chest Assessment Chest expansion symmetrical  Cardiac  Cardiac (WDL) WDL  Pulse Regular  Heart Sounds S1, S2;No adventitious heart sounds  Jugular Venous Distention (JVD) No  Cardiac Rhythm NSR  Vascular  Vascular (WDL) X  Cyanosis Nailbed (LEFT BIG TOE)  Capillary Refill Less than 3 seconds  Pulses R radial;L radial;L dorsalis pedis;R dorsalis pedis  RUE Neurovascular Assessment  R Radial Pulse +2  LUE Neurovascular Assessment  L  Radial Pulse +2  RLE Neurovascular Assessment  R Dorsalis Pedis Pulse +2  LLE Neurovascular Assessment  L Dorsalis Pedis Pulse +2  Integumentary  Integumentary (WDL) X  Skin Color Cyanosis (LEFT BIG TOE)  Cyanosis Generalized (LEFT BIG TOE)  Skin Condition Dry  Skin Integrity Intact  Skin Turgor Non-tenting  Sacral Foam Prophylactic Dressing Protocol  Dressing Interventions Dressing intact  Dressing Change Due 03/07/16  Braden Scale (Ages 8 and up)  Sensory Perceptions 4  Moisture 4  Activity 3  Mobility 4  Nutrition 4  Friction and Shear 3  Braden Scale Score 22  Braden Interventions  Braden Scale Interventions Heels  Musculoskeletal  Musculoskeletal (WDL) X  Generalized Weakness Yes  Weight Bearing Restrictions Yes  Gastrointestinal  Gastrointestinal (WDL) WDL  GU Assessment  Genitourinary (WDL) WDL  Psychosocial  Psychosocial (WDL) WDL

## 2016-03-06 LAB — HEMOGLOBIN A1C
Hgb A1c MFr Bld: 14.7 % — ABNORMAL HIGH (ref 4.8–5.6)
MEAN PLASMA GLUCOSE: 375 mg/dL

## 2016-03-06 LAB — GLUCOSE, CAPILLARY
GLUCOSE-CAPILLARY: 293 mg/dL — AB (ref 65–99)
GLUCOSE-CAPILLARY: 243 mg/dL — AB (ref 65–99)

## 2016-03-06 LAB — PROCALCITONIN: Procalcitonin: <0.1 ng/mL

## 2016-03-06 MED ORDER — INSULIN GLARGINE 100 UNITS/ML SOLOSTAR PEN: 25.0000 [IU] | PEN_INJECTOR | Freq: Every day | SUBCUTANEOUS | 0 refills | Status: AC

## 2016-03-06 MED ORDER — FLUCONAZOLE 100 MG PO TABS
150.0000 mg | ORAL_TABLET | Freq: Once | ORAL | Status: AC
  Administered 2016-03-06: 150 mg via ORAL
  Filled 2016-03-06: qty 1

## 2016-03-06 MED ORDER — ALPRAZOLAM 1 MG PO TABS: 0.5000 mg | ORAL_TABLET | Freq: Two times a day (BID) | ORAL | 0 refills | Status: AC | PRN

## 2016-03-06 MED ORDER — DOXYCYCLINE HYCLATE 100 MG PO TABS: 100.0000 mg | ORAL_TABLET | Freq: Two times a day (BID) | ORAL | 0 refills | Status: AC

## 2016-03-06 NOTE — Progress Notes (Signed)
Inpatient Diabetes Program Recommendations  AACE/ADA: New Consensus Statement on Inpatient Glycemic Control (2015)  Target Ranges:  Prepandial:   less than 140 mg/dL      Peak postprandial:   less than 180 mg/dL (1-2 hours)      Critically ill patients:  140 - 180 mg/dL   Lab Results  Component Value Date   GLUCAP 293 (H) 03/06/2016   HGBA1C 14.7 (H) 03/05/2016    Review of Glycemic Control  Current orders for Inpatient glycemic control: Lantus 25 units QHS, Novolog 0-15 units TID with meals, Novolog 0-5 units QHS, Novolog 6 units TID with meals for meal coverage  Inpatient Diabetes Program Recommendations:  Insulin - Basal: Please consider increasing Lantus to 30 units QHS. Insulin - Meal Coverage: Please consider increasing meal coverage Novolog to 10 units TID with meals.   Thanks, Orlando PennerMarie Ambry Dix, RN, MSN, CDE Diabetes Coordinator Inpatient Diabetes Program [phone] (Team Pager from 8am to 5pm)

## 2016-03-06 NOTE — Discharge Instructions (Addendum)
Resume diet and activity as before.  Diabetic diet  Take medications only as prescribed

## 2016-03-06 NOTE — Evaluation (Signed)
Physical Therapy Evaluation Patient Details Name: [name] MRN: [id number] DOB: March 31, 1960 Today's Date: 03/06/2016   History of Present Illness  Pt is a 56 y/o F who presented with flu like symptoms.  Pt was found to be hyperglycemic with acute encephalopathy and was placed on BiPAP.  Flu test was negative.  Pt's PMH includes COPD, tremors, CVA, back surgery, foot surgery, cervical spine surgery.    Clinical Impression  Pt admitted with above diagnosis. Pt currently with functional limitations due to the deficits listed below (see PT Problem List). Ms. Busser reports she has had 2-3 falls over the past 6 months but that she will have 24/7 supervision available from her daughter at d/c.  She scored 22/24 on the DGI indicating pt has a slightly increased risk of falling.  She requires supervision for ambulation and stair training.  Pt encouraged to use RW more consistently to decrease frequency of falls and pt verbalized understanding. Pt will benefit from skilled PT to increase their independence and safety with mobility to allow discharge to the venue listed below.      Follow Up Recommendations Outpatient PT (pt prefers OPPT over HHPT; requests [facility] in [place])    Equipment Recommendations  None recommended by PT    Recommendations for Other Services       Precautions / Restrictions Precautions Precautions: Fall Restrictions Weight Bearing Restrictions: No      Mobility  Bed Mobility               General bed mobility comments: Pt sitting EOB upon PT arrival  Transfers Overall transfer level: Modified independent Equipment used: None             General transfer comment: No instability noted.  Slightly guarded posture due to chronic low back pain.  Ambulation/Gait Ambulation/Gait assistance: Supervision Ambulation Distance (Feet): 250 Feet Assistive device: None Gait Pattern/deviations: Step-through pattern;Trunk flexed   Gait velocity interpretation:  at or above normal speed for age/gender General Gait Details: Flexed posture with dec Bil hip flexion.  No LOB or instability noted but supervision for safety.  Pt with h/o falls.  Stairs Stairs: Yes Stairs assistance: Supervision Stair Management: One rail Right;Alternating pattern;Forwards Number of Stairs: 3 General stair comments: Uses railing to steady and poor eccentric control during descent.  Supervision for safety.  Wheelchair Mobility    Modified Rankin (Stroke Patients Only)       Balance Overall balance assessment: Needs assistance;History of Falls Sitting-balance support: No upper extremity supported;Feet supported Sitting balance-Leahy Scale: Normal     Standing balance support: No upper extremity supported;During functional activity Standing balance-Leahy Scale: Good Standing balance comment: Steady with static standing.                   Standardized Balance Assessment Standardized Balance Assessment : Dynamic Gait Index   Dynamic Gait Index Level Surface: Normal Change in Gait Speed: Normal Gait with Horizontal Head Turns: Normal Gait with Vertical Head Turns: Normal Gait and Pivot Turn: Normal Step Over Obstacle: Mild Impairment Step Around Obstacles: Normal Steps: Mild Impairment Total Score: 22       Pertinent Vitals/Pain Pain Assessment: 0-10 Pain Score: 6  Pain Location: Chronic back and Bil shoulder pain. Pain Descriptors / Indicators: Aching;Constant Pain Intervention(s): Limited activity within patient's tolerance;Monitored during session    Home Living Family/patient expects to be discharged to:: Private residence Living Arrangements: Children Available Help at Discharge: Family;Available 24 hours/day Type of Home: Apartment Home Access: Level  entry     Home Layout: One level Home Equipment: Shower seat;Wheelchair - [employer]manual;Walker - 2 wheels;Bedside commode (Rollator with 3 wheels)      Prior Function Level of Independence:  Independent with assistive device(s)         Comments: If pt is feeling weak she will ambulate with RW but otherwise not using AD.  Pt reports 2-3 falls in the past 6 months.  Daughter does the driving.  Pt has some assist from daughter with cooking, cleaning.  Independent with bathing, dressing.     Hand Dominance   Dominant Hand: Right    Extremity/Trunk Assessment   Upper Extremity Assessment Upper Extremity Assessment: RUE deficits/detail;LUE deficits/detail RUE Deficits / Details: Strength grossly 4+/5 throughout; however, pt reports h/o RTC tear in Bil shoulders. LUE Deficits / Details: Strength grossly 4+/5 throughout; however, pt reports h/o RTC tear in Bil shoulders.    Lower Extremity Assessment Lower Extremity Assessment: Overall WFL for tasks assessed    Cervical / Trunk Assessment Cervical / Trunk Assessment: Kyphotic;Other exceptions Cervical / Trunk Exceptions: h/o low back pain, pt with flexed posture   Communication   Communication: No difficulties  Cognition Arousal/Alertness: Awake/alert Behavior During Therapy: WFL for tasks assessed/performed Overall Cognitive Status: Within Functional Limits for tasks assessed                      General Comments General comments (skin integrity, edema, etc.): Pt scored 22/24 on the DGI indicating pt has a slightly increased risk of falling.    Exercises     Assessment/Plan    PT Assessment Patient needs continued PT services  PT Problem List Decreased balance;Decreased strength;Decreased safety awareness          PT Treatment Interventions DME instruction;Gait training;Stair training;Therapeutic activities;Therapeutic exercise;Balance training;Neuromuscular re-education;Patient/family education    PT Goals (Current goals can be found in the Care Plan section)  Acute Rehab PT Goals Patient Stated Goal: to improve her balance PT Goal Formulation: With patient Time For Goal Achievement:  03/20/16 Potential to Achieve Goals: Good    Frequency Min 2X/week   Barriers to discharge        Co-evaluation               End of Session Equipment Utilized During Treatment: Gait belt Activity Tolerance: Patient tolerated treatment well Patient left: in bed;with call bell/phone within reach Nurse Communication: Mobility status         Time: [id number] PT Time Calculation (min) (ACUTE ONLY): 17 min   Charges:   PT Evaluation $PT Eval Low Complexity: 1 Procedure     PT G CodesEncarnacion Chu:        Nishaan Stanke PT, DPT 03/06/2016, 11:50 AM

## 2016-03-06 NOTE — Progress Notes (Signed)
Inpatient Diabetes Program Recommendations  AACE/ADA: New Consensus Statement on Inpatient Glycemic Control (2015)  Target Ranges:  Prepandial:   less than 140 mg/dL      Peak postprandial:   less than 180 mg/dL (1-2 hours)      Critically ill patients:  140 - 180 mg/dL   Lab Results  Component Value Date   GLUCAP 295 (H) 03/05/2016   HGBA1C 14.7 (H) 03/05/2016    Review of Glycemic Control  Results for [name], [name] (MRN [id number]) as of 03/06/2016 09:59  Ref. Range 03/05/2016 07:14 03/05/2016 11:16 03/05/2016 16:05 03/05/2016 20:51 03/06/2016 07:45  Glucose-Capillary Latest Ref Range: 65 - 99 mg/dL 657258 (H) 846282 (H) 962199 (H) 295 (H) 293 (H)    Home medications: Lantus 36 units bid, Novolog 10-21 units tid  Current orders for Inpatient glycemic control: Lantus 25 units QHS, Novolog 0-15 units TID with meals, Novolog 0-5 units QHS, Novolog 6 units TID with meals for meal coverage  Inpatient Diabetes Program Recommendations:  Please consider increasing Lantus to 30 units QHS. Please consider increasing meal coverage Novolog to 10 units TID with meals.  Susette RacerJulie Ema Hebner, RN, BA, MHA, CDE Diabetes Coordinator Inpatient Diabetes Program  [phone] (Team Pager) [phone] [facility] Office) 03/06/2016 9:59 AM

## 2016-03-09 LAB — CULTURE, BLOOD (ROUTINE X 2)
CULTURE: NO GROWTH
Culture: NO GROWTH

## 2016-03-13 NOTE — Discharge Summary (Signed)
[facility] - [place] [facility]   PATIENT NAME: [name]    MR#:  [id number]  DATE OF BIRTH:  1960-04-13  DATE OF ADMISSION:  03/04/2016 ADMITTING PHYSICIAN: Merwyn Katos, MD  DATE OF DISCHARGE: 03/06/2016  3:00 PM  PRIMARY CARE PHYSICIAN: Nancy Nordmann, MD   ADMISSION DIAGNOSIS:  Hypercarbia [R06.89] Encephalopathy [G93.40] Hyperglycemia [R73.9] Acute encephalopathy [G93.40]  DISCHARGE DIAGNOSIS:  Active Problems:   Acute encephalopathy   SECONDARY DIAGNOSIS:   Past Medical History:  Diagnosis Date  . Anxiety   . Asthma   . Coarse tremors   . COPD (chronic obstructive pulmonary disease) (HCC)    on 3L home o2  . CVA (cerebral vascular accident) (HCC)   . 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 Hyperlipidemia   . Hypertension   . Insomnia      ADMITTING HISTORY  CHIEF COMPLAINT:  Hyperglycemia  HISTORY OF PRESENT ILLNESS:   This is a 56 yo female with a PMH of Insomnia, HTN, Hyperlipidemia, Diabetes Mellitus, CVA, COPD, Home O2 3L, Current everyday smoker (0.5 PPD), Coarse Tremors, Asthma, and Anxiety.  She presented to [facility] ER 01/30 with hyperglycemia and lethargy.  Per ER notes EMS was notified when the pts daughter found her mother acting lethargic.  She stated her mother has been having flu like s/sx for the past 3-4 days and reports the pt has been vomiting, cough, chills, and generalized myalgias.  In the ER laboratory results revealed the pt was hyperglycemic with a serum glucose of 620, anion gap 7, and the pt was hypercapnic CO2 71.  Due to worsening acute encephalopathy she was placed on continuous Bipap.  Pt given 2L NS fluids bolus and 8 units of IV insulin with improvement of glucose 347.  PCCM contacted 01/30 to admit the pt to ICU due to acute on chronic hypercapnic respiratory failure secondary to AECOPD from ?Influenza vs. Upper respiratory Infection requiring continuous Bipap, Acute  encephalopathy, and Hyperglycemia.  PAST MEDICAL HISTORY :  She  has a past medical history of Anxiety; Asthma; Coarse tremors; COPD (chronic obstructive pulmonary disease) (HCC); CVA (cerebral vascular accident) (HCC); Diabetes mellitus without complication (HCC); Hyperlipidemia; Hypertension; and Insomnia.  PAST SURGICAL HISTORY: She  has a past surgical history that includes Back surgery; Foot surgery; Bronchoscopy; Cervical spine surgery; and Cholecystectomy  HOSPITAL COURSE:   1) Acute on chronic hypercarbic respiratory failure 2)COPD exacerbation 3) Benzodiazipine and opiate abuse 2) Poorly controlled DM2 with hyperglycemia 5) Hypertension 6) Anxiety 7) Acute encephalopathy   Patient admitted to ICU by PCCM duet to acute hypercarbic and hypoxic resp failure, with acute encephalopathy needing Bipap. This was due to COPD exacerbation and overuse of alprazolam and opiates. Patient off bipap and transferred to hospitalist service. Patient seen examined and results confirmed. Patient ready for discharge on room air. Prescriptions given for prednisone, doxycycline. Doses on alprazolam reduced. Counseled. Insulin dose increased due to uncontrolled DM with hyperglycemia. HbA1c 14.7  Stable for discharge home  F/u PCP and pulmonary 1-2 weeks  CONSULTS OBTAINED:    DRUG ALLERGIES:   Allergies  Allergen Reactions  . Lyrica [Pregabalin] Other (See Comments)    hallucination  . Penicillins   . Prozac [Fluoxetine Hcl] Other (See Comments)    hallucinations  . Vicodin [Hydrocodone-Acetaminophen] Rash    DISCHARGE MEDICATIONS:   Discharge Medication List as of 03/06/2016 11:26 AM    START taking these medications   Details  doxycycline (VIBRA-TABS)  100 MG tablet Take 1 tablet (100 mg total) by mouth 2 (two) times daily., Starting Thu 03/06/2016, Normal      CONTINUE these medications which have CHANGED   Details  ALPRAZolam (XANAX) 1 MG tablet Take 0.5 tablets (0.5 mg  total) by mouth 2 (two) times daily as needed for anxiety., Starting Thu 03/06/2016, No Print    insulin glargine (LANTUS) 100 unit/mL SOPN Inject 0.25 mLs (25 Units total) into the skin at bedtime., Starting Thu 03/06/2016, Print      CONTINUE these medications which have NOT CHANGED   Details  albuterol (PROVENTIL) (2.5 MG/3ML) 0.083% nebulizer solution Take 2.5 mg by nebulization every 6 (six) hours as needed for wheezing or shortness of breath., Historical Med    albuterol-ipratropium (COMBIVENT) 18-103 MCG/ACT inhaler Inhale 1 puff into the lungs every 6 (six) hours as needed for wheezing or shortness of breath., Historical Med    amitriptyline (ELAVIL) 25 MG tablet Take 25 mg by mouth 3 (three) times daily., Historical Med    amLODipine (NORVASC) 10 MG tablet Take 10 mg by mouth daily., Historical Med    !! aspirin EC 325 MG EC tablet Take 1 tablet (325 mg total) by mouth daily., Starting Sat 12/22/2015, Normal    !! aspirin EC 81 MG tablet Take by mouth daily., Historical Med    atorvastatin (LIPITOR) 80 MG tablet Take 40 mg by mouth daily. , Historical Med    chlorthalidone (HYGROTON) 25 MG tablet Take 12.5 mg by mouth every morning., Historical Med    fluticasone (FLONASE) 50 MCG/ACT nasal spray Place 2 sprays into both nostrils daily as needed for rhinitis., Historical Med    Fluticasone-Salmeterol (ADVAIR) 250-50 MCG/DOSE AEPB Inhale 1 puff into the lungs 2 (two) times daily., Historical Med    gabapentin (NEURONTIN) 300 MG capsule Take 2 capsules (600 mg total) by mouth 2 (two) times daily., Starting Fri 12/21/2015, Normal    Melatonin 3 MG TABS Take 1 tablet by mouth at bedtime., Historical Med    oxyCODONE-acetaminophen (PERCOCET) 7.5-325 MG tablet Take 1 tablet by mouth every 12 (twelve) hours as needed for severe pain. Every 4-6 hours, Starting Fri 01/11/2016, No Print    traZODone (DESYREL) 100 MG tablet Take 100 mg by mouth at bedtime as needed for sleep. , Historical  Med    vortioxetine HBr (TRINTELLIX) 10 MG TABS Take 1 tablet by mouth daily., Historical Med    zolpidem (AMBIEN) 10 MG tablet Take 10 mg by mouth at bedtime as needed for sleep., Historical Med    fenofibrate 160 MG tablet Take 1 tablet (160 mg total) by mouth daily., Starting Sat 01/12/2016, Normal    insulin aspart (NOVOLOG) 100 UNIT/ML injection Inject 0-5 Units into the skin at bedtime., Starting Fri 12/21/2015, Normal    lisinopril (PRINIVIL,ZESTRIL) 2.5 MG tablet Take 2.5 mg by mouth daily., Historical Med    magnesium oxide (MAG-OX) 400 (241.3 Mg) MG tablet Take 400 mg by mouth 2 (two) times daily. , Historical Med    metoprolol tartrate (LOPRESSOR) 25 MG tablet Take 25 mg by mouth 2 (two) times daily., Historical Med    tiotropium (SPIRIVA) 18 MCG inhalation capsule Place 18 mcg into inhaler and inhale daily., Historical Med     !! - Potential duplicate medications found. Please discuss with provider.      Today   VITAL SIGNS:  Blood pressure (!) 161/75, pulse 63, temperature 97.3 F (36.3 C), temperature source Oral, resp. rate 18, height 5\' 6"  (1.676  m), weight 74.6 kg (164 lb 7.4 oz), SpO2 97 %.  I/O:  No intake or output data in the 24 hours ending 03/13/16 1048  PHYSICAL EXAMINATION:  Physical Exam  GENERAL:  56 y.o.-year-old patient lying in the bed with no acute distress.  LUNGS: Normal breath sounds bilaterally, no wheezing, rales,rhonchi or crepitation. No use of accessory muscles of respiration.  CARDIOVASCULAR: S1, S2 normal. No murmurs, rubs, or gallops.  ABDOMEN: Soft, non-tender, non-distended. Bowel sounds present. No organomegaly or mass.  NEUROLOGIC: Moves all 4 extremities. PSYCHIATRIC: The patient is alert and oriented x 3.  SKIN: No obvious rash, lesion, or ulcer.   DATA REVIEW:   CBC No results for input(s): WBC, HGB, HCT, PLT in the last 168 hours.  Chemistries  No results for input(s): NA, K, CL, CO2, GLUCOSE, BUN, CREATININE, CALCIUM,  MG, AST, ALT, ALKPHOS, BILITOT in the last 168 hours.  Invalid input(s): GFRCGP  Cardiac Enzymes No results for input(s): TROPONINI in the last 168 hours.  Microbiology Results  Results for orders placed or performed during the hospital encounter of 03/04/16  Culture, blood (routine x 2)     Status: None   Collection Time: 03/04/16  6:23 AM  Result Value Ref Range Status   Specimen Description BLOOD LEFT HAND  Final   Special Requests   Final    BOTTLES DRAWN AEROBIC AND ANAEROBIC  AER 4CC ANA 2CC   Culture NO GROWTH 5 DAYS  Final   Report Status 03/09/2016 FINAL  Final  Culture, blood (routine x 2)     Status: None   Collection Time: 03/04/16  7:21 AM  Result Value Ref Range Status   Specimen Description BLOOD RIGHT ARM  Final   Special Requests   Final    BOTTLES DRAWN AEROBIC AND ANAEROBIC AER 11 ML ANA 12 ML   Culture NO GROWTH 5 DAYS  Final   Report Status 03/09/2016 FINAL  Final  MRSA PCR Screening     Status: None   Collection Time: 03/04/16  8:25 AM  Result Value Ref Range Status   MRSA by PCR NEGATIVE NEGATIVE Final    Comment:        The GeneXpert MRSA Assay (FDA approved for NASAL specimens only), is one component of a comprehensive MRSA colonization surveillance program. It is not intended to diagnose MRSA infection nor to guide or monitor treatment for MRSA infections.     RADIOLOGY:  No results found.  Follow up with PCP in 1 week.  Management plans discussed with the patient, family and they are in agreement.  CODE STATUS:  Code Status History    Date Active Date Inactive Code Status Order ID Comments User Context   03/04/2016  5:10 AM 03/06/2016  6:20 PM Full Code [id number]  Eugenie Norrie, NP ED   01/09/2016  5:08 PM 01/11/2016  5:57 PM Full Code [id number]  Delfino Lovett, MD Inpatient   12/19/2015 11:10 PM 12/21/2015  6:28 PM Full Code [id number]  Oralia Manis, MD Inpatient   07/17/2015  8:34 PM 07/23/2015  8:43 PM Full Code [id number]  Enid Baas,  MD Inpatient    Advance Directive Documentation   Flowsheet Row Most Recent Value  Type of Advance Directive  Healthcare Power of Attorney  Pre-existing out of facility DNR order (yellow form or pink MOST form)  No data  "MOST" Form in Place?  No data      TOTAL TIME TAKING CARE OF THIS PATIENT ON DAY OF DISCHARGE:  more than 30 minutes.   Milagros LollSudini, Carletha Dawn R M.D on 03/13/2016 at 10:48 AM  Between 7am to 6pm - Pager - [phone]  After 6pm go to [email or web address] - password [email or web address] [facility]  [facility] [place] [facility]  Office  [phone]  CC: Primary care physician; Nancy NordmannALVIN C POWELL JR, MD  Note: This dictation was prepared with Dragon dictation along with smaller phrase technology. Any transcriptional errors that result from this process are unintentional.

## 2016-03-25 ENCOUNTER — Emergency Department: Payer: Medicaid Other

## 2016-03-25 ENCOUNTER — Emergency Department
Admission: EM | Admit: 2016-03-25 | Discharge: 2016-03-25 | Disposition: A | Discharge status: Home / Self Care | Payer: Medicaid Other | Attending: Emergency Medicine | Admitting: Emergency Medicine

## 2016-03-25 ENCOUNTER — Encounter: Payer: Self-pay | Admitting: Emergency Medicine

## 2016-03-25 ENCOUNTER — Emergency Department
Admission: EM | Admit: 2016-03-25 | Discharge: 2016-03-26 | Disposition: A | Discharge status: Home / Self Care | Payer: Medicaid Other | Source: Home / Self Care | Attending: Emergency Medicine | Admitting: Emergency Medicine

## 2016-03-25 DIAGNOSIS — M25551 Pain in right hip: Secondary | ICD-10-CM

## 2016-03-25 DIAGNOSIS — F1910 Other psychoactive substance abuse, uncomplicated: Secondary | ICD-10-CM | POA: Diagnosis not present

## 2016-03-25 DIAGNOSIS — M545 Low back pain: Secondary | ICD-10-CM

## 2016-03-25 DIAGNOSIS — R4182 Altered mental status, unspecified: Secondary | ICD-10-CM | POA: Diagnosis not present

## 2016-03-25 DIAGNOSIS — J45909 Unspecified asthma, uncomplicated: Secondary | ICD-10-CM | POA: Insufficient documentation

## 2016-03-25 DIAGNOSIS — E1165 Type 2 diabetes mellitus with hyperglycemia: Secondary | ICD-10-CM

## 2016-03-25 DIAGNOSIS — Z79899 Other long term (current) drug therapy: Secondary | ICD-10-CM | POA: Diagnosis not present

## 2016-03-25 DIAGNOSIS — F17210 Nicotine dependence, cigarettes, uncomplicated: Secondary | ICD-10-CM | POA: Diagnosis not present

## 2016-03-25 DIAGNOSIS — W19XXXA Unspecified fall, initial encounter: Secondary | ICD-10-CM

## 2016-03-25 DIAGNOSIS — Z7982 Long term (current) use of aspirin: Secondary | ICD-10-CM | POA: Insufficient documentation

## 2016-03-25 DIAGNOSIS — J449 Chronic obstructive pulmonary disease, unspecified: Secondary | ICD-10-CM | POA: Diagnosis not present

## 2016-03-25 DIAGNOSIS — E119 Type 2 diabetes mellitus without complications: Secondary | ICD-10-CM | POA: Diagnosis not present

## 2016-03-25 DIAGNOSIS — Z794 Long term (current) use of insulin: Secondary | ICD-10-CM | POA: Insufficient documentation

## 2016-03-25 DIAGNOSIS — H05231 Hemorrhage of right orbit: Secondary | ICD-10-CM

## 2016-03-25 DIAGNOSIS — S80212A Abrasion, left knee, initial encounter: Secondary | ICD-10-CM

## 2016-03-25 LAB — CBC
HEMATOCRIT: 34.9 % — AB (ref 35.0–47.0)
Hemoglobin: 11.9 g/dL — ABNORMAL LOW (ref 12.0–16.0)
MCH: 30.3 pg (ref 26.0–34.0)
MCHC: 34.1 g/dL (ref 32.0–36.0)
MCV: 88.8 fL (ref 80.0–100.0)
PLATELETS: 263 10*3/uL (ref 150–440)
RBC: 3.94 MIL/uL (ref 3.80–5.20)
RDW: 15.1 % — ABNORMAL HIGH (ref 11.5–14.5)
WBC: 10.4 10*3/uL (ref 3.6–11.0)

## 2016-03-25 LAB — BASIC METABOLIC PANEL
ANION GAP: 7 (ref 5–15)
Anion gap: 4 — ABNORMAL LOW (ref 5–15)
BUN: 23 mg/dL — ABNORMAL HIGH (ref 6–20)
BUN: 23 mg/dL — ABNORMAL HIGH (ref 6–20)
CALCIUM: 8.8 mg/dL — AB (ref 8.9–10.3)
CO2: 29 mmol/L (ref 22–32)
CO2: 31 mmol/L (ref 22–32)
CREATININE: 0.96 mg/dL (ref 0.44–1.00)
Calcium: 9.2 mg/dL (ref 8.9–10.3)
Chloride: 101 mmol/L (ref 101–111)
Chloride: 105 mmol/L (ref 101–111)
Creatinine, Ser: 1.21 mg/dL — ABNORMAL HIGH (ref 0.44–1.00)
GFR calc Af Amer: 57 mL/min — ABNORMAL LOW (ref >60)
GFR, EST NON AFRICAN AMERICAN: 49 mL/min — AB (ref >60)
GLUCOSE: 437 mg/dL — AB (ref 65–99)
GLUCOSE: 198 mg/dL — AB (ref 65–99)
POTASSIUM: 5.2 mmol/L — AB (ref 3.5–5.1)
Potassium: 3.9 mmol/L (ref 3.5–5.1)
Sodium: 137 mmol/L (ref 135–145)
Sodium: 140 mmol/L (ref 135–145)

## 2016-03-25 LAB — GLUCOSE, CAPILLARY
GLUCOSE-CAPILLARY: 425 mg/dL — AB (ref 65–99)
GLUCOSE-CAPILLARY: 228 mg/dL — AB (ref 65–99)
Glucose-Capillary: 214 mg/dL — ABNORMAL HIGH (ref 65–99)

## 2016-03-25 LAB — TROPONIN I

## 2016-03-25 MED ORDER — SODIUM BICARBONATE 8.4 % IV SOLN
50.0000 meq | Freq: Once | INTRAVENOUS | Status: AC
  Administered 2016-03-25: 50 meq via INTRAVENOUS
  Filled 2016-03-25: qty 50

## 2016-03-25 MED ORDER — INSULIN ASPART 100 UNIT/ML ~~LOC~~ SOLN
10.0000 [IU] | Freq: Once | SUBCUTANEOUS | Status: AC
  Administered 2016-03-25: 10 [IU] via INTRAVENOUS
  Filled 2016-03-25: qty 10

## 2016-03-25 MED ORDER — ACETAMINOPHEN 500 MG PO TABS: 1000.0000 mg | ORAL_TABLET | Freq: Once | ORAL | Status: DC

## 2016-03-25 MED ORDER — OXYCODONE HCL 5 MG PO TABS
5.0000 mg | ORAL_TABLET | Freq: Once | ORAL | Status: AC
  Administered 2016-03-25: 5 mg via ORAL
  Filled 2016-03-25: qty 1

## 2016-03-25 MED ORDER — ACETAMINOPHEN 500 MG PO TABS
1000.0000 mg | ORAL_TABLET | Freq: Once | ORAL | Status: AC
  Administered 2016-03-25: 1000 mg via ORAL
  Filled 2016-03-25: qty 2

## 2016-03-25 MED ORDER — SODIUM CHLORIDE 0.9 % IV BOLUS (SEPSIS)
1000.0000 mL | Freq: Once | INTRAVENOUS | Status: AC
Start: 2016-03-25 — End: 2016-03-25
  Administered 2016-03-25: 1000 mL via INTRAVENOUS

## 2016-03-25 NOTE — ED Provider Notes (Signed)
[facility] Emergency Department Provider Note  ____________________________________________  Time seen: Approximately 10:15 AM  I have reviewed the triage vital signs and the nursing notes.   HISTORY  Chief Complaint Back Pain and Hyperglycemia   HPI [name] is a 56 y.o. female the history of chronic back and neck pains/p laminectomy, depression, DM, CVA with R sided weakness, recurrent falls, HTN, and HDL who presents status post mechanical fall. Patient reports that yesterday evening she was taking a shower when she slipped and fell in the bathroom. She fell onto her left knee. As she was trying to get up she slipped again and hit her face onto the top. She was able to get up, change and go to bed. This morning she was trying to get up from the bed when she slid off of it to the ground and her daughter was unable to lift her up so she called EMS. Patient is complaining of exacerbation of her chronic lumbar pain, right hip pain, and left shoulder pain. She also has a right periorbital hematoma but denies headache and neck pain. Patient denies saddle anesthesia, worsening weakness of her lower extremities that her baseline. She is incontinent of urine since her last stroke in October 2017. She denies bowel incontinence. Patient reports that she hasn't taken anything at home because she ran out of her Percocet's. She tells me that she takes 7.5 mg of Percocet but they usually don't help with her back pain. She has a walker at home. She lives with her daughter. She denies chest pain or shortness of breath, palpitations, abdominal pain, nausea, vomiting, diarrhea, dizziness, headache both preceding or after the fall. Patient is not on blood thinners.  Past Medical History:  Diagnosis Date  . Anxiety   . Asthma   . Coarse tremors   . COPD (chronic obstructive pulmonary disease) (HCC)    on 3L home o2  . CVA (cerebral vascular accident) (HCC)   . Diabetes  mellitus without complication (HCC)    insulin dependent  . Hyperlipidemia   . Hypertension   . Insomnia     Patient Active Problem List   Diagnosis Date Noted  . Acute encephalopathy 03/04/2016  . Stroke (HCC) 12/19/2015  . Diabetes (HCC) 12/19/2015  . Anxiety 08/31/2015  . Depression 08/31/2015  . Moderate recurrent major depression (HCC) 08/31/2015  . Grief at loss of child 07/23/2015  . Acute respiratory failure (HCC)   . COPD (chronic obstructive pulmonary disease) (HCC) 07/17/2015    Past Surgical History:  Procedure Laterality Date  . BACK SURGERY     x 2 lumbar spine  . BRONCHOSCOPY    . CERVICAL SPINE SURGERY    . CHOLECYSTECTOMY    . FOOT SURGERY      Prior to Admission medications   Medication Sig Start Date End Date Taking? Authorizing Provider  albuterol (PROVENTIL) (2.5 MG/3ML) 0.083% nebulizer solution Take 2.5 mg by nebulization every 6 (six) hours as needed for wheezing or shortness of breath.    Historical Provider, MD  albuterol-ipratropium (COMBIVENT) 18-103 MCG/ACT inhaler Inhale 1 puff into the lungs every 6 (six) hours as needed for wheezing or shortness of breath.    Historical Provider, MD  ALPRAZolam Prudy Feeler) 1 MG tablet Take 0.5 tablets (0.5 mg total) by mouth 2 (two) times daily as needed for anxiety. 03/06/16   Srikar Sudini, MD  amLODipine (NORVASC) 10 MG tablet Take 10 mg by mouth daily.    Historical Provider, MD  aspirin EC 325 MG EC tablet Take 1 tablet (325 mg total) by mouth daily. 12/22/15   Katha Hamming, MD  atorvastatin (LIPITOR) 40 MG tablet Take 40 mg by mouth daily.     Historical Provider, MD  chlorthalidone (HYGROTON) 25 MG tablet Take 12.5 mg by mouth every morning.    Historical Provider, MD  doxycycline (VIBRA-TABS) 100 MG tablet Take 1 tablet (100 mg total) by mouth 2 (two) times daily. 03/06/16   Milagros Loll, MD  fenofibrate 160 MG tablet Take 1 tablet (160 mg total) by mouth daily. 01/12/16   Auburn Bilberry, MD  fluticasone  (FLONASE) 50 MCG/ACT nasal spray Place 2 sprays into both nostrils daily as needed for rhinitis.    Historical Provider, MD  Fluticasone-Salmeterol (ADVAIR) 250-50 MCG/DOSE AEPB Inhale 1 puff into the lungs 2 (two) times daily.    Historical Provider, MD  gabapentin (NEURONTIN) 300 MG capsule Take 2 capsules (600 mg total) by mouth 2 (two) times daily. Patient taking differently: Take 600 mg by mouth 3 (three) times daily.  12/21/15   Katha Hamming, MD  insulin aspart (NOVOLOG) 100 UNIT/ML injection Inject 0-5 Units into the skin at bedtime. Patient taking differently: Inject 21 Units into the skin 3 (three) times daily with meals.  12/21/15   Katha Hamming, MD  insulin glargine (LANTUS) 100 unit/mL SOPN Inject 0.25 mLs (25 Units total) into the skin at bedtime. Patient taking differently: Inject 38 Units into the skin 2 (two) times daily.  03/06/16   Srikar Sudini, MD  lisinopril (PRINIVIL,ZESTRIL) 2.5 MG tablet Take 2.5 mg by mouth daily.    Historical Provider, MD  magnesium oxide (MAG-OX) 400 (241.3 Mg) MG tablet Take 400 mg by mouth 2 (two) times daily.     Historical Provider, MD  Melatonin 3 MG TABS Take 1 tablet by mouth at bedtime.    Historical Provider, MD  metoprolol tartrate (LOPRESSOR) 25 MG tablet Take 25 mg by mouth 2 (two) times daily.    Historical Provider, MD  oxyCODONE-acetaminophen (PERCOCET) 7.5-325 MG tablet Take 1 tablet by mouth every 12 (twelve) hours as needed for severe pain. Every 4-6 hours Patient taking differently: Take 1 tablet by mouth every 6 (six) hours as needed for severe pain. Every 4-6 hours 01/11/16   Auburn Bilberry, MD  tiotropium (SPIRIVA) 18 MCG inhalation capsule Place 18 mcg into inhaler and inhale daily.    Historical Provider, MD  traZODone (DESYREL) 100 MG tablet Take 100 mg by mouth at bedtime as needed for sleep.     Historical Provider, MD  vortioxetine HBr (TRINTELLIX) 10 MG TABS Take 1 tablet by mouth daily.    Historical Provider, MD    zolpidem (AMBIEN) 10 MG tablet Take 10 mg by mouth at bedtime as needed for sleep.    Historical Provider, MD    Allergies Lyrica [pregabalin]; Penicillins; Prozac [fluoxetine hcl]; and Vicodin [hydrocodone-acetaminophen]  Family History  Problem Relation Age of Onset  . CAD Mother   . Hypertension Mother   . Throat cancer Father     Social History Social History  Substance Use Topics  . Smoking status: Current Every Day Smoker    Packs/day: 0.50    Types: Cigarettes  . Smokeless tobacco: Never Used  . Alcohol use No    Review of Systems Constitutional: Negative for fever. Eyes: Negative for visual changes. ENT: Negative for facial injury or neck injury Cardiovascular: Negative for chest injury. Respiratory: Negative for shortness of breath. Negative for chest wall injury.  Gastrointestinal: Negative for abdominal pain or injury. Genitourinary: Negative for dysuria. Musculoskeletal: + back pain, R hip pain and L knee pain. Skin: Negative for laceration/abrasions. Neurological: + head injury.   ____________________________________________   PHYSICAL EXAM:  VITAL SIGNS: ED Triage Vitals  Enc Vitals Group     BP 03/25/16 1005 (!) 143/72     Pulse Rate 03/25/16 1005 (!) 58     Resp 03/25/16 1005 18     Temp 03/25/16 1005 98.5 F (36.9 C)     Temp Source 03/25/16 1005 Oral     SpO2 03/25/16 1005 94 %     Weight 03/25/16 1005 164 lb (74.4 kg)     Height 03/25/16 1005 5\' 6"  (1.676 m)     Head Circumference --      Peak Flow --      Pain Score 03/25/16 1006 8     Pain Loc --      Pain Edu? --      Excl. in GC? --    Full spinal precautions maintained throughout the trauma exam. Constitutional: Slurring, looks sleepy, no distress HEENT Head: Normocephalic and atraumatic. Face: No facial bony tenderness. Stable midface Ears: No hemotympanum bilaterally. No Battle sign Eyes: No eye injury. PERRL. No raccoon eyes. R periorbital hematoma Nose: Nontender. No  epistaxis. No rhinorrhea Mouth/Throat: Mucous membranes are moist. No oropharyngeal blood. No dental injury. Airway patent without stridor. Normal voice. Neck: no C-collar in place. No midline c-spine tenderness.  Cardiovascular: Normal rate, regular rhythm. Normal and symmetric distal pulses are present in all extremities. Pulmonary/Chest: Chest wall is stable and nontender to palpation/compression. Normal respiratory effort. Breath sounds are normal. No crepitus.  Abdominal: Soft, nontender, non distended. Musculoskeletal: Nontender with normal full range of motion in all extremities. No deformities. No thoracic or lumbar midline spinal tenderness. Pelvis is stable. Ttp over the lateral R proximal femur, full painless ROM of b/l hip. ttp over the left knee Skin: Skin is warm, dry and intact. Abrasion over the L knee. Psychiatric: Speech and behavior are appropriate. Neurological: Normal speech and language. Moves all extremities to command. No gross focal neurologic deficits are appreciated.  Glascow Coma Score: 4 - Opens eyes on own 6 - Follows simple motor commands 5 - Alert and oriented GCS: 14   ____________________________________________   LABS (all labs ordered are listed, but only abnormal results are displayed)  Labs Reviewed  CBC - Abnormal; Notable for the following:       Result Value   Hemoglobin 11.9 (*)    HCT 34.9 (*)    RDW 15.1 (*)    All other components within normal limits  BASIC METABOLIC PANEL - Abnormal; Notable for the following:    Potassium 5.2 (*)    Glucose, Bld 437 (*)    BUN 23 (*)    Creatinine, Ser 1.21 (*)    GFR calc non Af Amer 49 (*)    GFR calc Af Amer 57 (*)    All other components within normal limits  GLUCOSE, CAPILLARY - Abnormal; Notable for the following:    Glucose-Capillary 425 (*)    All other components within normal limits  GLUCOSE, CAPILLARY - Abnormal; Notable for the following:    Glucose-Capillary 228 (*)    All other  components within normal limits  BASIC METABOLIC PANEL - Abnormal; Notable for the following:    Glucose, Bld 198 (*)    BUN 23 (*)    Calcium 8.8 (*)  Anion gap 4 (*)    All other components within normal limits  TROPONIN I   ____________________________________________  EKG  ED ECG REPORT I, Nita Sicklearolina Ethin Drummond, the attending physician, personally viewed and interpreted this ECG.  Sinus bradycardia, rate of 55, right bundle branch block, normal PR and QTc intervals, right axis deviation, no ST elevations or depressions. Unchanged from prior.  ____________________________________________  RADIOLOGY  CT head/ cspine: negative for acute injury  CT lumbar spine: Negative for acute injury  Hip XR and knee XR: negative for acute injury  ____________________________________________   PROCEDURES  Procedure(s) performed: None Procedures Critical Care performed:  None ____________________________________________   INITIAL IMPRESSION / ASSESSMENT AND PLAN / ED COURSE  56 y.o. female the history of chronic back and neck pains/p laminectomy, depression, DM, CVA with R sided weakness, recurrent falls, HTN, and HDL who presents status post mechanical fall complaining of worsening lower back pain, Right hip pain and left knee pain. Patient has an abrasion over the left knee and has tenderness to palpation over the right proximal lateral femur with no obvious deformities. Patient has no tenderness to palpation over CT and L-spine midline however since she has had multiple surgeries and distracting injuries I will CT both her C-spine and L-spine. Also CT her head. We'll get x-ray of the left knee and right hip. Patient is hyperglycemic and has not taken her insulin this morning. We'll check basic blood work to rule out DKA. We'll give her fluids. Patient also looks very drowsy and somnolent therefore we'll hold off on opiates at this time. She is on a lot of different medications that can  cause drowsiness including Ambien, trazodone, Percocets, gabapentin and Xanax  Clinical Course as of Mar 26 2222  Tue Mar 25, 2016  1157 Imaging studies with no acute findings. Potassium is mildly elevated at 5.2, patient blood glucose is 437 with no evidence of DKA. We'll give IV fluids, IV insulin and bicarbonate for hyperkalemia. EKG with no hyperkalemic changes. Patient continues to look somnolent and drowsy and every time I walk in the room keeps asking for pain medication. I do not feel comfortable giving her narcotics based on her mental status at this time. Drug screen is pending.  [CV]  1441 Patient ambulated with a walker with no pain medicine. Blood glucose is now 228. Repeat BMP is pending. Plan to discharge home to follow-up with primary care doctor.  [CV]    Clinical Course User Index [CV] Nita Sicklearolina Bradin Mcadory, MD    Pertinent labs & imaging results that were available during my care of the patient were reviewed by me and considered in my medical decision making (see chart for details).    ____________________________________________   FINAL CLINICAL IMPRESSION(S) / ED DIAGNOSES  Final diagnoses:  Fall, initial encounter  Periorbital hematoma of right eye  Abrasion of left knee, initial encounter  Right hip pain  Low back pain, unspecified back pain laterality, unspecified chronicity, with sciatica presence unspecified  Type 2 diabetes mellitus with hyperglycemia, unspecified long term insulin use status (HCC)      NEW MEDICATIONS STARTED DURING THIS VISIT:  Discharge Medication List as of 03/25/2016  2:58 PM       Note:  This document was prepared using Dragon voice recognition software and may include unintentional dictation errors.    Nita Sicklearolina Akeya Ryther, MD 03/26/16 2224

## 2016-03-25 NOTE — Discharge Instructions (Signed)

## 2016-03-25 NOTE — ED Triage Notes (Signed)
Pt arrives from home via [facility] for altered mental status. PT was seen here earlier today for hyperglycemia and back pain. Pt was given oxycodone while in ED for pain. Pt states she took 1 BP pill yest and 1 today. BP at home palp was 72, EMS gave normal saline and palp BP was 94. Pt unsure of what room, doctor, or nurse she had earlier today. EMS states pt was gray upon arrival and slurred speech, lethargic, and altered. CBG 173. 98% RA. HR 50 with EMS. Pt arrives with IV L wrist. Pt answers questions appropriately, states she is having a panic attack but no symptoms of panic noted by this RN. Pt appears in no distress at this time. Color seems to have improved with fluids.

## 2016-03-25 NOTE — ED Triage Notes (Signed)
Pt arrived via EMS from home for reports of chronic back pain for which she is out of medication. Pt reports not taking Novolog this morning because she did not eat. EMS reports 117/77, 60HR, 95% RA, CBG 402.

## 2016-03-25 NOTE — Progress Notes (Signed)
Inpatient Diabetes Program Recommendations  AACE/ADA: New Consensus Statement on Inpatient Glycemic Control (2015)  Target Ranges:  Prepandial:   less than 140 mg/dL      Peak postprandial:   less than 180 mg/dL (1-2 hours)      Critically ill patients:  140 - 180 mg/dL   Lab Results  Component Value Date   GLUCAP 228 (H) 03/25/2016   HGBA1C 14.7 (H) 03/05/2016    Review of Glycemic Control;  From her last visit dated 03/06/16, patient was taking; Home medications: Lantus 36 units bid, Novolog 10-21 units tid:  @ the hospital 03/06/16 Lantus 25 units QHS, Novolog 0-15 units TID with meals, Novolog 0-5 units QHS, Novolog 6 units TID with meals for meal coverage   Medications this visit; Novolog 10 units given once.  If the patient is going to be admitted, consider Lantus 18 units qhs, Novolog 10 units tid with meals, Novolog 0-9 units tid, Novolog 0-5 units qhs.  Susette RacerJulie Juandedios Dudash, RN, BA, MHA, CDE Diabetes Coordinator Inpatient Diabetes Program  [phone] (Team Pager) [phone] [facility] Office) 03/25/2016 2:09 PM

## 2016-03-25 NOTE — ED Provider Notes (Signed)
[facility] Emergency Department Provider Note   First MD Initiated Contact with Patient 03/25/16 2304     (approximate)  I have reviewed the triage vital signs and the nursing notes.   HISTORY  Chief Complaint Altered Mental Status    HPI [name] is a 56 y.o. female with below of chronic medical conditions including CVA recent emergency department visit today for hyperglycemia and back pain returns to the emergency department via EMS with altered mental status. Per EMS on arrival patient was "gray with slurred speech and appeared to be lethargic initial glucose level was 173 oxygen saturation 90% on room air with a heart rate of 50 per EMS. Patient admits to having a fall 2 days ago resulting in right periorbital contusion with CT scan performed at 10:58AM this morning really no intracranial abnormality.   Past Medical History:  Diagnosis Date  . Anxiety   . Asthma   . Coarse tremors   . COPD (chronic obstructive pulmonary disease) (HCC)    on 3L home o2  . CVA (cerebral vascular accident) (HCC)   . 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 Hyperlipidemia   . Hypertension   . Insomnia     Patient Active Problem List   Diagnosis Date Noted  . Acute encephalopathy 03/04/2016  . Stroke (HCC) 12/19/2015  . Diabetes (HCC) 12/19/2015  . Anxiety 08/31/2015  . Depression 08/31/2015  . Moderate recurrent major depression (HCC) 08/31/2015  . Grief at loss of child 07/23/2015  . Acute respiratory failure (HCC)   . COPD (chronic obstructive pulmonary disease) (HCC) 07/17/2015    Past Surgical History:  Procedure Laterality Date  . BACK SURGERY     x 2 lumbar spine  . BRONCHOSCOPY    . CERVICAL SPINE SURGERY    . CHOLECYSTECTOMY    . FOOT SURGERY      Prior to Admission medications   Medication Sig Start Date End Date Taking? Authorizing Provider  albuterol (PROVENTIL) (2.5 MG/3ML) 0.083% nebulizer solution  Take 2.5 mg by nebulization every 6 (six) hours as needed for wheezing or shortness of breath.    Historical Provider, MD  albuterol-ipratropium (COMBIVENT) 18-103 MCG/ACT inhaler Inhale 1 puff into the lungs every 6 (six) hours as needed for wheezing or shortness of breath.    Historical Provider, MD  ALPRAZolam Prudy Feeler(XANAX) 1 MG tablet Take 0.5 tablets (0.5 mg total) by mouth 2 (two) times daily as needed for anxiety. 03/06/16   Milagros LollSrikar Sudini, MD  amitriptyline (ELAVIL) 25 MG tablet Take 25 mg by mouth 3 (three) times daily.    Historical Provider, MD  amLODipine (NORVASC) 10 MG tablet Take 10 mg by mouth daily.    Historical Provider, MD  aspirin EC 325 MG EC tablet Take 1 tablet (325 mg total) by mouth daily. 12/22/15   Katha HammingSnehalatha Konidena, MD  aspirin EC 81 MG tablet Take by mouth daily.    Historical Provider, MD  atorvastatin (LIPITOR) 80 MG tablet Take 40 mg by mouth daily.     Historical Provider, MD  chlorthalidone (HYGROTON) 25 MG tablet Take 12.5 mg by mouth every morning.    Historical Provider, MD  doxycycline (VIBRA-TABS) 100 MG tablet Take 1 tablet (100 mg total) by mouth 2 (two) times daily. 03/06/16   Milagros LollSrikar Sudini, MD  fenofibrate 160 MG tablet Take 1 tablet (160 mg total) by mouth daily. 01/12/16   Auburn BilberryShreyang Patel, MD  fluticasone (FLONASE) 50 MCG/ACT nasal spray  Place 2 sprays into both nostrils daily as needed for rhinitis.    Historical Provider, MD  Fluticasone-Salmeterol (ADVAIR) 250-50 MCG/DOSE AEPB Inhale 1 puff into the lungs 2 (two) times daily.    Historical Provider, MD  gabapentin (NEURONTIN) 300 MG capsule Take 2 capsules (600 mg total) by mouth 2 (two) times daily. Patient taking differently: Take 600 mg by mouth 3 (three) times daily.  12/21/15   Katha Hamming, MD  insulin aspart (NOVOLOG) 100 UNIT/ML injection Inject 0-5 Units into the skin at bedtime. Patient not taking: Reported on 03/04/2016 12/21/15   Katha Hamming, MD  insulin glargine (LANTUS) 100 unit/mL  SOPN Inject 0.25 mLs (25 Units total) into the skin at bedtime. 03/06/16   Srikar Sudini, MD  lisinopril (PRINIVIL,ZESTRIL) 2.5 MG tablet Take 2.5 mg by mouth daily.    Historical Provider, MD  magnesium oxide (MAG-OX) 400 (241.3 Mg) MG tablet Take 400 mg by mouth 2 (two) times daily.     Historical Provider, MD  Melatonin 3 MG TABS Take 1 tablet by mouth at bedtime.    Historical Provider, MD  metoprolol tartrate (LOPRESSOR) 25 MG tablet Take 25 mg by mouth 2 (two) times daily.    Historical Provider, MD  oxyCODONE-acetaminophen (PERCOCET) 7.5-325 MG tablet Take 1 tablet by mouth every 12 (twelve) hours as needed for severe pain. Every 4-6 hours Patient taking differently: Take 1 tablet by mouth every 6 (six) hours as needed for severe pain. Every 4-6 hours 01/11/16   Auburn Bilberry, MD  tiotropium (SPIRIVA) 18 MCG inhalation capsule Place 18 mcg into inhaler and inhale daily.    Historical Provider, MD  traZODone (DESYREL) 100 MG tablet Take 100 mg by mouth at bedtime as needed for sleep.     Historical Provider, MD  vortioxetine HBr (TRINTELLIX) 10 MG TABS Take 1 tablet by mouth daily.    Historical Provider, MD  zolpidem (AMBIEN) 10 MG tablet Take 10 mg by mouth at bedtime as needed for sleep.    Historical Provider, MD    Allergies Lyrica [pregabalin]; Penicillins; Prozac [fluoxetine hcl]; and Vicodin [hydrocodone-acetaminophen]  Family History  Problem Relation Age of Onset  . CAD Mother   . Hypertension Mother   . Throat cancer Father     Social History Social History  Substance Use Topics  . Smoking status: Current Every Day Smoker    Packs/day: 0.50    Types: Cigarettes  . Smokeless tobacco: Never Used  . Alcohol use No    Review of Systems Constitutional: No fever/chills Eyes: No visual changes. ENT: No sore throat. Cardiovascular: Denies chest pain. Respiratory: Denies shortness of breath. Gastrointestinal: No abdominal pain.  No nausea, no vomiting.  No diarrhea.  No  constipation. Genitourinary: Negative for dysuria. Musculoskeletal: Negative for back pain. Skin: Negative for rash. Neurological: Negative for headaches, focal weakness or numbness.  10-point ROS otherwise negative.  ____________________________________________   PHYSICAL EXAM:  VITAL SIGNS: ED Triage Vitals  Enc Vitals Group     BP 03/25/16 2253 (!) 112/53     Pulse Rate 03/25/16 2253 62     Resp 03/25/16 2253 18     Temp 03/25/16 2253 97.8 F (36.6 C)     Temp Source 03/25/16 2253 Oral     SpO2 03/25/16 2253 94 %     Weight 03/25/16 2253 164 lb (74.4 kg)     Height 03/25/16 2253 5\' 6"  (1.676 m)     Head Circumference --      Peak Flow --  Pain Score 03/25/16 2254 8     Pain Loc --      Pain Edu? --      Excl. in GC? --     Constitutional: Alert and oriented. Well appearing and in no acute distress. Eyes: Conjunctivae are normal. PERRL. EOMI. Head: Atraumatic. Ears:  Healthy appearing ear canals and TMs bilaterally Nose: No congestion/rhinnorhea. Mouth/Throat: Mucous membranes are moist. Oropharynx non-erythematous. Neck: No stridor.  No meningeal signs.  No cervical spine tenderness to palpation. Cardiovascular: Normal rate, regular rhythm. Good peripheral circulation. Grossly normal heart sounds. Respiratory: Normal respiratory effort.  No retractions. Lungs CTAB. Gastrointestinal: Soft and nontender. No distention.  Musculoskeletal: No lower extremity tenderness nor edema. No gross deformities of extremities. Neurologic:  Normal speech and language. No gross focal neurologic deficits are appreciated.  Skin:  Skin is warm, dry and intact. No rash noted. Psychiatric: Mood and affect are normal. Speech and behavior are normal.  ____________________________________________   LABS (all labs ordered are listed, but only abnormal results are displayed)  Labs Reviewed  GLUCOSE, CAPILLARY - Abnormal; Notable for the following:       Result Value    Glucose-Capillary 214 (*)    All other components within normal limits  COMPREHENSIVE METABOLIC PANEL  CBC  URINE DRUG SCREEN, QUALITATIVE ([facility] ONLY)  CBG MONITORING, ED   ____________________________________________  EKG  ED ECG REPORT I, [place] Makinzey Banes, the attending physician, personally viewed and interpreted this ECG.   Date: 03/25/2016  EKG Time: 10:49 PM  Rate: 61  Rhythm: Normal sinus rhythm  Axis: Normal  Intervals: Normal  ST&T Change: None  ____________________________________________  RADIOLOGY I, [place] N Dereck Agerton, personally viewed and evaluated these images (plain radiographs) as part of my medical decision making, as well as reviewing the written report by the radiologist.  Dg Knee 2 Views Left  Result Date: 03/25/2016 CLINICAL DATA:  Chronic pain. EXAM: LEFT KNEE - 1-2 VIEW COMPARISON:  No recent prior. FINDINGS: No acute bony or joint abnormality identified. No evidence of fracture or dislocation. Peripheral vascular calcification. IMPRESSION: 1.  No acute bony or joint abnormality identified. 2. Peripheral vascular disease. Electronically Signed   By: Maisie Fus  Register   On: 03/25/2016 11:28   Ct Head Wo Contrast  Result Date: 03/25/2016 CLINICAL DATA:  Status post fall with a blow to the head on a bathtub today. EXAM: CT HEAD WITHOUT CONTRAST CT CERVICAL SPINE WITHOUT CONTRAST TECHNIQUE: Multidetector CT imaging of the head and cervical spine was performed following the standard protocol without intravenous contrast. Multiplanar CT image reconstructions of the cervical spine were also generated. COMPARISON:  None. FINDINGS: CT HEAD FINDINGS Brain: Appears normal without hemorrhage, infarct, mass lesion, mass effect, midline shift or abnormal extra-axial fluid collection. No hydrocephalus or pneumocephalus. Vascular: Negative. Skull: Intact. Sinuses/Orbits: Negative. Other: None. CT CERVICAL SPINE FINDINGS Alignment: Maintained. Skull base and vertebrae: No  fracture or focal abnormality. Status post C4-7 ACDF. Hardware appears intact. Soft tissues and spinal canal: No prevertebral fluid or swelling. No visible canal hematoma. Disc levels: Mild loss of disc space height is seen at C3-4 and C7-T1. Upper chest: Lung apices are clear. Other: None. IMPRESSION: No acute abnormality head or cervical spine. Status post C4-7 ACDF. Electronically Signed   By: Drusilla Kanner M.D.   On: 03/25/2016 11:11   Ct Cervical Spine Wo Contrast  Result Date: 03/25/2016 CLINICAL DATA:  Status post fall with a blow to the head on a bathtub today. EXAM: CT HEAD WITHOUT CONTRAST  CT CERVICAL SPINE WITHOUT CONTRAST TECHNIQUE: Multidetector CT imaging of the head and cervical spine was performed following the standard protocol without intravenous contrast. Multiplanar CT image reconstructions of the cervical spine were also generated. COMPARISON:  None. FINDINGS: CT HEAD FINDINGS Brain: Appears normal without hemorrhage, infarct, mass lesion, mass effect, midline shift or abnormal extra-axial fluid collection. No hydrocephalus or pneumocephalus. Vascular: Negative. Skull: Intact. Sinuses/Orbits: Negative. Other: None. CT CERVICAL SPINE FINDINGS Alignment: Maintained. Skull base and vertebrae: No fracture or focal abnormality. Status post C4-7 ACDF. Hardware appears intact. Soft tissues and spinal canal: No prevertebral fluid or swelling. No visible canal hematoma. Disc levels: Mild loss of disc space height is seen at C3-4 and C7-T1. Upper chest: Lung apices are clear. Other: None. IMPRESSION: No acute abnormality head or cervical spine. Status post C4-7 ACDF. Electronically Signed   By: Drusilla Kanner M.D.   On: 03/25/2016 11:11   Ct Lumbar Spine Wo Contrast  Result Date: 03/25/2016 CLINICAL DATA:  56 year old female with chronic lumbar back pain. Prior surgery. Subsequent encounter. EXAM: CT LUMBAR SPINE WITHOUT CONTRAST TECHNIQUE: Multidetector CT imaging of the lumbar spine was  performed without intravenous contrast administration. Multiplanar CT image reconstructions were also generated. COMPARISON:  Lumbar MRI 01/10/2016. Lumbar radiographs 12/19/2015, and earlier. FINDINGS: Segmentation: Normal, which is the same numbering system used on the December 2017 MRI. Alignment: Stable. Grade 1 anterolisthesis of L3 on L4 measuring 4-5 mm. Vertebrae: No acute osseous abnormality identified. Visible sacrum and SI joints are intact. Postoperative details described below. Paraspinal and other soft tissues: Extensive Calcified aortic atherosclerosis. 2-3 mm right nephrolithiasis. Mild postoperative changes to the posterior paraspinal soft tissues at L4 and L5. Disc levels: T11-T12:  Mild facet hypertrophy. T12-L1:  Negative. L1-L2:  Stable mild facet hypertrophy. L2-L3: Mild to moderate facet and ligament flavum hypertrophy. Mild circumferential disc bulge. Stable borderline to mild spinal and left foraminal stenosis. L3-L4: Grade 1 anterolisthesis. Disc space loss. Severe bilateral facet hypertrophy. Ligament flavum hypertrophy. Circumferential disc bulge with broad-based posterior component. Bulky left far lateral component. Severe spinal stenosis, severe left L3 foraminal stenosis, and mild to moderate right foraminal stenosis are probably stable since December. See series 4, image 67. L4-L5: Previous right hemilaminectomy as well as transpedicular and interbody fusion hardware placement. There is some interbody ossification, but scant if any interbody arthrodesis (sagittal image 43. No posterior element arthrodesis. Severe residual facet hypertrophy. No evidence of hardware loosening. Residual moderate to severe right L4 foraminal stenosis appears stable (series 5, image 34). L5-S1: Severe facet hypertrophy greater on the right where there is vacuum facet. Mild disc bulge with broad-based posterior component. No significant spinal stenosis. Mild to moderate bilateral L5 foraminal stenosis  appears stable. IMPRESSION: 1.  No acute osseous abnormality identified. 2. Previous L4-L5 right laminectomy and fusion. Scant if any interbody arthrodesis. No evidence of hardware loosening. Stable residual right L4 foraminal stenosis. 3. Adjacent segment disease at L3-L4 with grade 1 spondylolisthesis and severe multifactorial spinal and left neural foraminal stenosis. No significant change since the 01/10/2016 Lumbar MRI. 4. Other lumbar levels also appear stable. Moderate L2-L3 and severe L5-S1 facet arthropathy. 5.  Calcified aortic atherosclerosis.  Right nephrolithiasis. Electronically Signed   By: Odessa Fleming M.D.   On: 03/25/2016 11:15   Dg Hip Unilat W Or Wo Pelvis 2-3 Views Right  Result Date: 03/25/2016 CLINICAL DATA:  Chronic pain. EXAM: DG HIP (WITH OR WITHOUT PELVIS) 2-3V RIGHT COMPARISON:  MRI 01/10/2016 . FINDINGS: Prior lower lumbar fusion. Diffuse  osteopenia degenerative change. No acute bony abnormality. Aortoiliac atherosclerotic vascular calcification. Femoral atherosclerotic vascular calcification. IMPRESSION: 1. Lower lumbar spine fusion. Hardware intact. Diffuse osteopenia degenerative change. No acute bony abnormality. 2. Aortoiliac and peripheral vascular disease . Electronically Signed   By: Maisie Fus  Register   On: 03/25/2016 11:27     Procedures     INITIAL IMPRESSION / ASSESSMENT AND PLAN / ED COURSE  Pertinent labs & imaging results that were available during my care of the patient were reviewed by me and considered in my medical decision making (see chart for details).  56 year old female presented to the emergency department with altered mental status. Patient requesting Xanax for anxiety on her arrival however patient very somnolent. Concern for possible polysubstance abuse as the etiology for patient's altered mental status. CT scan negative laboratory data likewise. After multiple hours of ED observation patient is now alert and oriented no longer somnolent. Patient  is requesting narcotic pain medications at this time as well as "something for anxiety. Patient's care transferred to Dr. Roxan Hockey      ____________________________________________  FINAL CLINICAL IMPRESSION(S) / ED DIAGNOSES  Final diagnoses:  Altered mental status, unspecified altered mental status type  Polysubstance abuse     MEDICATIONS GIVEN DURING THIS VISIT:  Medications - No data to display   NEW OUTPATIENT MEDICATIONS STARTED DURING THIS VISIT:  New Prescriptions   No medications on file    Modified Medications   No medications on file    Discontinued Medications   No medications on file     Note:  This document was prepared using Dragon voice recognition software and may include unintentional dictation errors.    Darci Current, MD 03/27/16 [phone]

## 2016-03-25 NOTE — ED Notes (Signed)
Pt ambulated in room with walker. Gait slow but steady. Pt denied needing to urinate at this time.

## 2016-03-25 NOTE — ED Notes (Signed)
Pt talked to her daughter on the phone.

## 2016-03-26 LAB — GLUCOSE, CAPILLARY: GLUCOSE-CAPILLARY: 228 mg/dL — AB (ref 65–99)

## 2016-03-26 LAB — CBC
HEMATOCRIT: 32 % — AB (ref 35.0–47.0)
HEMOGLOBIN: 11.1 g/dL — AB (ref 12.0–16.0)
MCH: 30.6 pg (ref 26.0–34.0)
MCHC: 34.6 g/dL (ref 32.0–36.0)
MCV: 88.5 fL (ref 80.0–100.0)
Platelets: 260 10*3/uL (ref 150–440)
RBC: 3.61 MIL/uL — ABNORMAL LOW (ref 3.80–5.20)
RDW: 14.9 % — ABNORMAL HIGH (ref 11.5–14.5)
WBC: 11 10*3/uL (ref 3.6–11.0)

## 2016-03-26 LAB — BASIC METABOLIC PANEL
ANION GAP: 6 (ref 5–15)
BUN: 23 mg/dL — ABNORMAL HIGH (ref 6–20)
CALCIUM: 8.4 mg/dL — AB (ref 8.9–10.3)
CO2: 28 mmol/L (ref 22–32)
Chloride: 105 mmol/L (ref 101–111)
Creatinine, Ser: 1.17 mg/dL — ABNORMAL HIGH (ref 0.44–1.00)
GFR calc non Af Amer: 51 mL/min — ABNORMAL LOW (ref >60)
GFR, EST AFRICAN AMERICAN: 59 mL/min — AB (ref >60)
Glucose, Bld: 212 mg/dL — ABNORMAL HIGH (ref 65–99)
POTASSIUM: 4.1 mmol/L (ref 3.5–5.1)
Sodium: 139 mmol/L (ref 135–145)

## 2016-03-26 LAB — HEPATIC FUNCTION PANEL
ALBUMIN: 3 g/dL — AB (ref 3.5–5.0)
ALK PHOS: 96 U/L (ref 38–126)
ALT: 10 U/L — AB (ref 14–54)
AST: 16 U/L (ref 15–41)
BILIRUBIN TOTAL: 0.3 mg/dL (ref 0.3–1.2)
Bilirubin, Direct: <0.1 mg/dL — ABNORMAL LOW (ref 0.1–0.5)
TOTAL PROTEIN: 6.6 g/dL (ref 6.5–8.1)

## 2016-03-26 MED ORDER — HYDROMORPHONE HCL 1 MG/ML IJ SOLN: 1.0000 mg | Freq: Once | INTRAMUSCULAR | Status: DC

## 2016-03-26 MED ORDER — KETOROLAC TROMETHAMINE 30 MG/ML IJ SOLN
30.0000 mg | Freq: Once | INTRAMUSCULAR | Status: DC
Start: 2016-03-26 — End: 2016-03-26

## 2016-03-26 MED ORDER — KETOROLAC TROMETHAMINE 30 MG/ML IJ SOLN
30.0000 mg | Freq: Once | INTRAMUSCULAR | Status: AC
  Administered 2016-03-26: 30 mg via INTRAVENOUS
  Filled 2016-03-26: qty 1

## 2016-03-26 NOTE — ED Provider Notes (Signed)
Patient received in sign-out from Dr. Manson PasseyBrown.  Workup and evaluation pending further observation until clinically sober. Patient hemodynamically stable. Able to ambulate about room. Encephalopathy has cleared and after discussing with the daughter does appear secondary to polysubstance abuse. Patient has requested multiple times for additional pain medications. She is provided Toradol but is requesting narcotic medication. Explained to her that we would not be prescribing any additional narcotics here in the ER as this is the cause of her presenting symptoms.  I spoke with the patient's daughter Chrystal who is coming to pick the patient up.  Have discussed with the patient and available family all diagnostics and treatments performed thus far and all questions were answered to the best of my ability. The patient demonstrates understanding and agreement with plan. Willy Eddy.      Maddyn Lieurance, MD 03/26/16 [phone]

## 2016-03-26 NOTE — ED Notes (Signed)
Pt ambulated with assistance with nad noted

## 2016-03-26 NOTE — ED Notes (Signed)
Blood samples were drawn and sent to the lab as requested by the lab.

## 2016-03-29 ENCOUNTER — Inpatient Hospital Stay
Admission: EM | Admit: 2016-03-29 | Discharge: 2016-03-29 | DRG: 270 | Disposition: A | Discharge status: Other Acute Inpatient Hospital | Payer: Medicaid Other | Attending: Internal Medicine | Admitting: Internal Medicine

## 2016-03-29 ENCOUNTER — Encounter
Admission: EM | Disposition: A | Discharge status: Other Acute Inpatient Hospital | Payer: Self-pay | Source: Home / Self Care | Attending: Internal Medicine

## 2016-03-29 ENCOUNTER — Inpatient Hospital Stay (HOSPITAL_COMMUNITY): Payer: Medicaid Other

## 2016-03-29 ENCOUNTER — Emergency Department: Payer: Medicaid Other

## 2016-03-29 ENCOUNTER — Inpatient Hospital Stay (HOSPITAL_COMMUNITY)
Admission: EM | Admit: 2016-03-29 | Discharge: 2016-04-03 | DRG: 270 | Disposition: E | Payer: Medicaid Other | Source: Other Acute Inpatient Hospital | Attending: Pulmonary Disease | Admitting: Pulmonary Disease

## 2016-03-29 ENCOUNTER — Inpatient Hospital Stay
Admission: EM | Admit: 2016-03-29 | Payer: Self-pay | Source: Other Acute Inpatient Hospital | Admitting: Pulmonary Disease

## 2016-03-29 DIAGNOSIS — F17210 Nicotine dependence, cigarettes, uncomplicated: Secondary | ICD-10-CM | POA: Diagnosis present

## 2016-03-29 DIAGNOSIS — E872 Acidosis: Secondary | ICD-10-CM | POA: Diagnosis present

## 2016-03-29 DIAGNOSIS — R402312 Coma scale, best motor response, none, at arrival to emergency department: Secondary | ICD-10-CM | POA: Diagnosis present

## 2016-03-29 DIAGNOSIS — I739 Peripheral vascular disease, unspecified: Secondary | ICD-10-CM

## 2016-03-29 DIAGNOSIS — Z9981 Dependence on supplemental oxygen: Secondary | ICD-10-CM

## 2016-03-29 DIAGNOSIS — E785 Hyperlipidemia, unspecified: Secondary | ICD-10-CM | POA: Diagnosis present

## 2016-03-29 DIAGNOSIS — Z7982 Long term (current) use of aspirin: Secondary | ICD-10-CM

## 2016-03-29 DIAGNOSIS — D649 Anemia, unspecified: Secondary | ICD-10-CM | POA: Diagnosis present

## 2016-03-29 DIAGNOSIS — E1165 Type 2 diabetes mellitus with hyperglycemia: Secondary | ICD-10-CM | POA: Diagnosis present

## 2016-03-29 DIAGNOSIS — Z880 Allergy status to penicillin: Secondary | ICD-10-CM | POA: Diagnosis not present

## 2016-03-29 DIAGNOSIS — G4700 Insomnia, unspecified: Secondary | ICD-10-CM | POA: Diagnosis present

## 2016-03-29 DIAGNOSIS — E1151 Type 2 diabetes mellitus with diabetic peripheral angiopathy without gangrene: Secondary | ICD-10-CM | POA: Diagnosis present

## 2016-03-29 DIAGNOSIS — J449 Chronic obstructive pulmonary disease, unspecified: Secondary | ICD-10-CM | POA: Diagnosis not present

## 2016-03-29 DIAGNOSIS — Z66 Do not resuscitate: Secondary | ICD-10-CM | POA: Diagnosis not present

## 2016-03-29 DIAGNOSIS — Z794 Long term (current) use of insulin: Secondary | ICD-10-CM

## 2016-03-29 DIAGNOSIS — I25110 Atherosclerotic heart disease of native coronary artery with unstable angina pectoris: Secondary | ICD-10-CM | POA: Diagnosis not present

## 2016-03-29 DIAGNOSIS — Z6829 Body mass index (BMI) 29.0-29.9, adult: Secondary | ICD-10-CM

## 2016-03-29 DIAGNOSIS — R570 Cardiogenic shock: Secondary | ICD-10-CM | POA: Insufficient documentation

## 2016-03-29 DIAGNOSIS — Z8249 Family history of ischemic heart disease and other diseases of the circulatory system: Secondary | ICD-10-CM

## 2016-03-29 DIAGNOSIS — Z888 Allergy status to other drugs, medicaments and biological substances status: Secondary | ICD-10-CM | POA: Diagnosis not present

## 2016-03-29 DIAGNOSIS — I2510 Atherosclerotic heart disease of native coronary artery without angina pectoris: Secondary | ICD-10-CM | POA: Diagnosis not present

## 2016-03-29 DIAGNOSIS — E119 Type 2 diabetes mellitus without complications: Secondary | ICD-10-CM | POA: Diagnosis present

## 2016-03-29 DIAGNOSIS — Z885 Allergy status to narcotic agent status: Secondary | ICD-10-CM

## 2016-03-29 DIAGNOSIS — R0902 Hypoxemia: Secondary | ICD-10-CM | POA: Diagnosis present

## 2016-03-29 DIAGNOSIS — Z79899 Other long term (current) drug therapy: Secondary | ICD-10-CM

## 2016-03-29 DIAGNOSIS — I2584 Coronary atherosclerosis due to calcified coronary lesion: Secondary | ICD-10-CM | POA: Diagnosis present

## 2016-03-29 DIAGNOSIS — I2119 ST elevation (STEMI) myocardial infarction involving other coronary artery of inferior wall: Secondary | ICD-10-CM | POA: Diagnosis not present

## 2016-03-29 DIAGNOSIS — R402212 Coma scale, best verbal response, none, at arrival to emergency department: Secondary | ICD-10-CM | POA: Diagnosis not present

## 2016-03-29 DIAGNOSIS — F419 Anxiety disorder, unspecified: Secondary | ICD-10-CM | POA: Diagnosis present

## 2016-03-29 DIAGNOSIS — E669 Obesity, unspecified: Secondary | ICD-10-CM | POA: Diagnosis present

## 2016-03-29 DIAGNOSIS — Z515 Encounter for palliative care: Secondary | ICD-10-CM | POA: Diagnosis not present

## 2016-03-29 DIAGNOSIS — Z7951 Long term (current) use of inhaled steroids: Secondary | ICD-10-CM | POA: Diagnosis not present

## 2016-03-29 DIAGNOSIS — Z79891 Long term (current) use of opiate analgesic: Secondary | ICD-10-CM

## 2016-03-29 DIAGNOSIS — R402112 Coma scale, eyes open, never, at arrival to emergency department: Secondary | ICD-10-CM | POA: Diagnosis present

## 2016-03-29 DIAGNOSIS — I455 Other specified heart block: Secondary | ICD-10-CM | POA: Diagnosis not present

## 2016-03-29 DIAGNOSIS — I70203 Unspecified atherosclerosis of native arteries of extremities, bilateral legs: Secondary | ICD-10-CM | POA: Diagnosis present

## 2016-03-29 DIAGNOSIS — I442 Atrioventricular block, complete: Secondary | ICD-10-CM | POA: Diagnosis not present

## 2016-03-29 DIAGNOSIS — Z8673 Personal history of transient ischemic attack (TIA), and cerebral infarction without residual deficits: Secondary | ICD-10-CM | POA: Diagnosis not present

## 2016-03-29 DIAGNOSIS — R251 Tremor, unspecified: Secondary | ICD-10-CM | POA: Diagnosis present

## 2016-03-29 DIAGNOSIS — I10 Essential (primary) hypertension: Secondary | ICD-10-CM | POA: Diagnosis present

## 2016-03-29 DIAGNOSIS — I469 Cardiac arrest, cause unspecified: Secondary | ICD-10-CM | POA: Diagnosis present

## 2016-03-29 DIAGNOSIS — G931 Anoxic brain damage, not elsewhere classified: Secondary | ICD-10-CM | POA: Diagnosis present

## 2016-03-29 DIAGNOSIS — I2111 ST elevation (STEMI) myocardial infarction involving right coronary artery: Principal | ICD-10-CM | POA: Diagnosis present

## 2016-03-29 DIAGNOSIS — I4901 Ventricular fibrillation: Secondary | ICD-10-CM | POA: Diagnosis present

## 2016-03-29 DIAGNOSIS — N179 Acute kidney failure, unspecified: Secondary | ICD-10-CM | POA: Diagnosis present

## 2016-03-29 HISTORY — PX: CORONARY STENT INTERVENTION: CATH118234

## 2016-03-29 HISTORY — PX: IABP INSERTION: CATH118242

## 2016-03-29 HISTORY — PX: TEMPORARY PACEMAKER: CATH118268

## 2016-03-29 HISTORY — PX: LEFT HEART CATH AND CORONARY ANGIOGRAPHY: CATH118249

## 2016-03-29 LAB — CBC
HEMATOCRIT: 32.7 % — AB (ref 35.0–47.0)
HEMOGLOBIN: 10.3 g/dL — AB (ref 12.0–16.0)
MCH: 29.8 pg (ref 26.0–34.0)
MCHC: 31.5 g/dL — ABNORMAL LOW (ref 32.0–36.0)
MCV: 94.5 fL (ref 80.0–100.0)
Platelets: 244 10*3/uL (ref 150–440)
RBC: 3.46 MIL/uL — AB (ref 3.80–5.20)
RDW: 15.3 % — ABNORMAL HIGH (ref 11.5–14.5)
WBC: 21.8 10*3/uL — AB (ref 3.6–11.0)

## 2016-03-29 LAB — BLOOD GAS, ARTERIAL
Acid-base deficit: 12.3 mmol/L — ABNORMAL HIGH (ref 0.0–2.0)
Acid-base deficit: 13.4 mmol/L — ABNORMAL HIGH (ref 0.0–2.0)
Acid-base deficit: 11.1 mmol/L — ABNORMAL HIGH (ref 0.0–2.0)
BICARBONATE: 15.8 mmol/L — AB (ref 20.0–28.0)
BICARBONATE: 18.1 mmol/L — AB (ref 20.0–28.0)
Bicarbonate: 17.1 mmol/L — ABNORMAL LOW (ref 20.0–28.0)
FIO2: 0.5
FIO2: 1
FIO2: 0.7
LHR: 22 {breaths}/min
MECHVT: 450 mL
O2 SAT: 99.4 %
O2 Saturation: 68.6 %
O2 Saturation: 90.7 %
PATIENT TEMPERATURE: 37
PCO2 ART: 48 mmHg (ref 32.0–48.0)
PEEP/CPAP: 5 cmH2O
PEEP/CPAP: 5 cmH2O
PH ART: 7.06 — AB (ref 7.350–7.450)
PH ART: 7.1 — AB (ref 7.350–7.450)
PH ART: 7.16 — AB (ref 7.350–7.450)
PO2 ART: 204 mmHg — AB (ref 83.0–108.0)
Patient temperature: 37
Patient temperature: 37
RATE: 16 resp/min
VT: 500 mL
pCO2 arterial: 51 mmHg — ABNORMAL HIGH (ref 32.0–48.0)
pCO2 arterial: 64 mmHg — ABNORMAL HIGH (ref 32.0–48.0)
pO2, Arterial: 50 mmHg — ABNORMAL LOW (ref 83.0–108.0)
pO2, Arterial: 77 mmHg — ABNORMAL LOW (ref 83.0–108.0)

## 2016-03-29 LAB — DIFFERENTIAL
Basophils Absolute: 0.2 10*3/uL — ABNORMAL HIGH (ref 0–0.1)
Basophils Relative: 1 %
Eosinophils Absolute: 0.2 10*3/uL (ref 0–0.7)
Eosinophils Relative: 1 %
LYMPHS PCT: 42 %
Lymphs Abs: 9.1 10*3/uL — ABNORMAL HIGH (ref 1.0–3.6)
MONO ABS: 0.8 10*3/uL (ref 0.2–0.9)
MONOS PCT: 4 %
Neutro Abs: 11.5 10*3/uL — ABNORMAL HIGH (ref 1.4–6.5)
Neutrophils Relative %: 52 %

## 2016-03-29 LAB — LIPID PANEL
CHOL/HDL RATIO: 4.9 ratio
Cholesterol: 136 mg/dL (ref 0–200)
HDL: 28 mg/dL — ABNORMAL LOW (ref >40)
LDL CALC: 61 mg/dL (ref 0–99)
TRIGLYCERIDES: 234 mg/dL — AB (ref <150)
VLDL: 47 mg/dL — AB (ref 0–40)

## 2016-03-29 LAB — BLOOD GAS, VENOUS
Acid-Base Excess: 2 mmol/L (ref 0.0–2.0)
Bicarbonate: 30.1 mmol/L — ABNORMAL HIGH (ref 20.0–28.0)
O2 SAT: 76.6 %
PCO2 VEN: 64 mmHg — AB (ref 44.0–60.0)
PH VEN: 7.28 (ref 7.250–7.430)
PO2 VEN: 47 mmHg — AB (ref 32.0–45.0)
Patient temperature: 37

## 2016-03-29 LAB — URINALYSIS, COMPLETE (UACMP) WITH MICROSCOPIC
Bilirubin Urine: NEGATIVE
Glucose, UA: 150 mg/dL — AB
Ketones, ur: NEGATIVE mg/dL
Leukocytes, UA: NEGATIVE
NITRITE: NEGATIVE
PH: 5 (ref 5.0–8.0)
Protein, ur: >=300 mg/dL — AB
SPECIFIC GRAVITY, URINE: 1.013 (ref 1.005–1.030)
Squamous Epithelial / LPF: NONE SEEN
WBC, UA: NONE SEEN WBC/hpf (ref 0–5)

## 2016-03-29 LAB — COMPREHENSIVE METABOLIC PANEL
ALBUMIN: 2.4 g/dL — AB (ref 3.5–5.0)
ALK PHOS: 127 U/L — AB (ref 38–126)
ALT: 42 U/L (ref 14–54)
ANION GAP: 12 (ref 5–15)
AST: 76 U/L — ABNORMAL HIGH (ref 15–41)
BUN: 16 mg/dL (ref 6–20)
CALCIUM: 8.1 mg/dL — AB (ref 8.9–10.3)
CO2: 21 mmol/L — AB (ref 22–32)
Chloride: 102 mmol/L (ref 101–111)
Creatinine, Ser: 1.51 mg/dL — ABNORMAL HIGH (ref 0.44–1.00)
GFR calc Af Amer: 44 mL/min — ABNORMAL LOW (ref >60)
GFR calc non Af Amer: 38 mL/min — ABNORMAL LOW (ref >60)
GLUCOSE: 365 mg/dL — AB (ref 65–99)
Potassium: 4.4 mmol/L (ref 3.5–5.1)
SODIUM: 135 mmol/L (ref 135–145)
Total Bilirubin: 0.5 mg/dL (ref 0.3–1.2)
Total Protein: 5.5 g/dL — ABNORMAL LOW (ref 6.5–8.1)

## 2016-03-29 LAB — GLUCOSE, CAPILLARY
GLUCOSE-CAPILLARY: 519 mg/dL — AB (ref 65–99)
Glucose-Capillary: 354 mg/dL — ABNORMAL HIGH (ref 65–99)

## 2016-03-29 LAB — APTT: aPTT: 38 seconds — ABNORMAL HIGH (ref 24–36)

## 2016-03-29 LAB — PROTIME-INR
INR: 1.13
Prothrombin Time: 14.6 seconds (ref 11.4–15.2)

## 2016-03-29 LAB — LACTIC ACID, PLASMA: Lactic Acid, Venous: 7.1 mmol/L (ref 0.5–1.9)

## 2016-03-29 LAB — TROPONIN I: Troponin I: <0.03 ng/mL (ref <0.03)

## 2016-03-29 SURGERY — CORONARY STENT INTERVENTION
Anesthesia: Moderate Sedation

## 2016-03-29 MED ORDER — HEPARIN SODIUM (PORCINE) 1000 UNIT/ML IJ SOLN
INTRAMUSCULAR | Status: AC
  Filled 2016-03-29: qty 1

## 2016-03-29 MED ORDER — DEXTROSE 5 % IV SOLN
0.0000 ug/min | INTRAVENOUS | Status: DC
  Administered 2016-03-29: 10 ug/min via INTRAVENOUS
  Filled 2016-03-29: qty 4

## 2016-03-29 MED ORDER — SODIUM BICARBONATE 8.4 % IV SOLN
INTRAVENOUS | Status: DC
  Administered 2016-03-29: 23:00:00 via INTRAVENOUS
  Filled 2016-03-29 (×2): qty 150

## 2016-03-29 MED ORDER — SODIUM CHLORIDE 0.9 % IV SOLN
INTRAVENOUS | Status: DC | PRN
Start: 2016-03-29 — End: 2016-03-30

## 2016-03-29 MED ORDER — EPINEPHRINE PF 1 MG/10ML IJ SOSY
PREFILLED_SYRINGE | INTRAMUSCULAR | Status: DC | PRN
Start: 2016-03-29 — End: 2016-03-29
  Administered 2016-03-29: 9 ug via INTRAVENOUS
  Administered 2016-03-29: 5 ug via INTRAVENOUS

## 2016-03-29 MED ORDER — EPINEPHRINE PF 1 MG/10ML IJ SOSY
PREFILLED_SYRINGE | INTRAMUSCULAR | Status: AC
  Filled 2016-03-29: qty 10

## 2016-03-29 MED ORDER — EPINEPHRINE PF 1 MG/ML IJ SOLN
0.5000 ug/min | INTRAVENOUS | Status: DC
  Administered 2016-03-29 – 2016-03-30 (×2): 9 ug/min via INTRAVENOUS
  Filled 2016-03-29 (×3): qty 4

## 2016-03-29 MED ORDER — MIDAZOLAM HCL 2 MG/2ML IJ SOLN: 1.0000 mg | INTRAMUSCULAR | Status: DC | PRN

## 2016-03-29 MED ORDER — SODIUM BICARBONATE 8.4 % IV SOLN: 50.0000 meq | Freq: Once | INTRAVENOUS | Status: DC

## 2016-03-29 MED ORDER — SODIUM CHLORIDE 0.9 % IV SOLN
INTRAVENOUS | Status: DC | PRN
  Administered 2016-03-29: 500 mL via INTRAVENOUS

## 2016-03-29 MED ORDER — FENTANYL BOLUS VIA INFUSION
25.0000 ug | INTRAVENOUS | Status: DC | PRN
  Filled 2016-03-29: qty 25

## 2016-03-29 MED ORDER — HEPARIN (PORCINE) IN NACL 100-0.45 UNIT/ML-% IJ SOLN
700.0000 [IU]/h | INTRAMUSCULAR | Status: DC
  Administered 2016-03-30: 900 [IU]/h via INTRAVENOUS

## 2016-03-29 MED ORDER — SODIUM CHLORIDE 0.9 % IV SOLN: 25.0000 ug/h | INTRAVENOUS | Status: DC

## 2016-03-29 MED ORDER — HEPARIN SODIUM (PORCINE) 1000 UNIT/ML IJ SOLN
INTRAMUSCULAR | Status: DC | PRN
  Administered 2016-03-29: 6000 [IU] via INTRAVENOUS
  Administered 2016-03-29: 3000 [IU] via INTRAVENOUS
  Administered 2016-03-29 (×2): 2000 [IU] via INTRAVENOUS

## 2016-03-29 MED ORDER — ORAL CARE MOUTH RINSE
15.0000 mL | OROMUCOSAL | Status: DC
  Administered 2016-03-30 (×6): 15 mL via OROMUCOSAL

## 2016-03-29 MED ORDER — DEXTROSE 5 % IV SOLN
0.0000 ug/min | Freq: Once | INTRAVENOUS | Status: DC
  Filled 2016-03-29: qty 4

## 2016-03-29 MED ORDER — FENTANYL BOLUS VIA INFUSION
50.0000 ug | INTRAVENOUS | Status: DC | PRN
  Filled 2016-03-29: qty 50

## 2016-03-29 MED ORDER — SODIUM CHLORIDE 0.9 % IV SOLN: 250.0000 mL | INTRAVENOUS | Status: DC | PRN

## 2016-03-29 MED ORDER — TIROFIBAN HCL IN NACL 5-0.9 MG/100ML-% IV SOLN
INTRAVENOUS | Status: AC
  Filled 2016-03-29: qty 100

## 2016-03-29 MED ORDER — SODIUM CHLORIDE 0.9 % IV SOLN: INTRAVENOUS | Status: DC

## 2016-03-29 MED ORDER — TIROFIBAN HCL IN NACL 5-0.9 MG/100ML-% IV SOLN
0.1500 ug/kg/min | INTRAVENOUS | Status: DC
  Administered 2016-03-29: 0.15 ug/kg/min via INTRAVENOUS
  Filled 2016-03-29: qty 100

## 2016-03-29 MED ORDER — EPINEPHRINE PF 1 MG/ML IJ SOLN
0.5000 ug/min | INTRAVENOUS | Status: DC
  Filled 2016-03-29: qty 4

## 2016-03-29 MED ORDER — ASPIRIN 300 MG RE SUPP: 300.0000 mg | RECTAL | Status: DC

## 2016-03-29 MED ORDER — PANTOPRAZOLE SODIUM 40 MG IV SOLR
40.0000 mg | INTRAVENOUS | Status: DC
  Administered 2016-03-30: 40 mg via INTRAVENOUS
  Filled 2016-03-29: qty 40

## 2016-03-29 MED ORDER — ENOXAPARIN SODIUM 40 MG/0.4ML ~~LOC~~ SOLN: 40.0000 mg | SUBCUTANEOUS | Status: DC

## 2016-03-29 MED ORDER — SODIUM BICARBONATE 8.4 % IV SOLN
INTRAVENOUS | Status: DC | PRN
  Administered 2016-03-29: 50 meq via INTRAVENOUS

## 2016-03-29 MED ORDER — DEXTROSE 5 % IV SOLN
0.0000 ug/min | INTRAVENOUS | Status: DC
  Administered 2016-03-29 – 2016-03-30 (×3): 45 ug/min via INTRAVENOUS
  Filled 2016-03-29 (×3): qty 16

## 2016-03-29 MED ORDER — NALOXONE HCL 2 MG/2ML IJ SOSY
PREFILLED_SYRINGE | INTRAMUSCULAR | Status: AC
  Administered 2016-03-29: 2 mg
  Filled 2016-03-29: qty 2

## 2016-03-29 MED ORDER — FENTANYL CITRATE (PF) 100 MCG/2ML IJ SOLN: 50.0000 ug | Freq: Once | INTRAMUSCULAR | Status: DC

## 2016-03-29 MED ORDER — DEXTROSE 5 % IV SOLN
0.0000 ug/min | INTRAVENOUS | Status: DC
  Administered 2016-03-29: 35 ug/min via INTRAVENOUS
  Filled 2016-03-29: qty 4

## 2016-03-29 MED ORDER — DOPAMINE-DEXTROSE 3.2-5 MG/ML-% IV SOLN
0.0000 ug/kg/min | INTRAVENOUS | Status: DC
  Administered 2016-03-29 – 2016-03-30 (×4): 30 ug/kg/min via INTRAVENOUS
  Filled 2016-03-29 (×3): qty 250

## 2016-03-29 MED ORDER — SODIUM BICARBONATE 8.4 % IV SOLN
INTRAVENOUS | Status: AC
  Filled 2016-03-29: qty 50

## 2016-03-29 MED ORDER — HEPARIN BOLUS VIA INFUSION
4000.0000 [IU] | Freq: Once | INTRAVENOUS | Status: DC
  Filled 2016-03-29: qty 4000

## 2016-03-29 MED ORDER — CHLORHEXIDINE GLUCONATE 0.12% ORAL RINSE (MEDLINE KIT)
15.0000 mL | Freq: Two times a day (BID) | OROMUCOSAL | Status: DC
  Administered 2016-03-29 – 2016-03-30 (×2): 15 mL via OROMUCOSAL

## 2016-03-29 MED ORDER — HEPARIN SODIUM (PORCINE) 5000 UNIT/ML IJ SOLN: 5000.0000 [IU] | Freq: Three times a day (TID) | INTRAMUSCULAR | Status: DC

## 2016-03-29 MED ORDER — FAMOTIDINE IN NACL 20-0.9 MG/50ML-% IV SOLN: 20.0000 mg | Freq: Two times a day (BID) | INTRAVENOUS | Status: DC

## 2016-03-29 MED ORDER — TIROFIBAN (AGGRASTAT) BOLUS VIA INFUSION
INTRAVENOUS | Status: DC | PRN
  Administered 2016-03-29: 1860 ug via INTRAVENOUS

## 2016-03-29 MED ORDER — DOPAMINE-DEXTROSE 3.2-5 MG/ML-% IV SOLN
INTRAVENOUS | Status: AC
  Administered 2016-03-29: 10 ug/kg/min
  Filled 2016-03-29: qty 250

## 2016-03-29 MED ORDER — STERILE WATER FOR INJECTION IV SOLN
INTRAVENOUS | Status: DC
  Filled 2016-03-29 (×3): qty 850

## 2016-03-29 MED ORDER — HEPARIN (PORCINE) IN NACL 100-0.45 UNIT/ML-% IJ SOLN
850.0000 [IU]/h | INTRAMUSCULAR | Status: DC
  Filled 2016-03-29: qty 250

## 2016-03-29 MED ORDER — ONDANSETRON HCL 4 MG/2ML IJ SOLN: 4.0000 mg | Freq: Four times a day (QID) | INTRAMUSCULAR | Status: DC | PRN

## 2016-03-29 MED ORDER — SODIUM BICARBONATE 8.4 % IV SOLN
INTRAVENOUS | Status: AC
  Filled 2016-03-29: qty 350

## 2016-03-29 MED ORDER — EPINEPHRINE PF 1 MG/10ML IJ SOSY
PREFILLED_SYRINGE | INTRAMUSCULAR | Status: DC | PRN
  Administered 2016-03-29: .5 mL via INTRAVENOUS
  Administered 2016-03-29: 5 ug via INTRAVENOUS
  Administered 2016-03-29: .5 via INTRAVENOUS

## 2016-03-29 MED ORDER — SODIUM CHLORIDE 0.9% FLUSH: 3.0000 mL | INTRAVENOUS | Status: DC | PRN

## 2016-03-29 MED ORDER — TIROFIBAN HCL IN NACL 5-0.9 MG/100ML-% IV SOLN
INTRAVENOUS | Status: DC | PRN
  Administered 2016-03-29: 0.075 ug/kg/min via INTRAVENOUS

## 2016-03-29 MED ORDER — IOHEXOL 350 MG/ML SOLN
INTRAVENOUS | Status: DC | PRN
  Administered 2016-03-29: 170 mL via INTRAVENOUS

## 2016-03-29 MED ORDER — SODIUM CHLORIDE 0.9 % IV SOLN
INTRAVENOUS | Status: DC
  Administered 2016-03-29: 4.8 [IU]/h via INTRAVENOUS
  Administered 2016-03-30: 21.6 [IU]/h via INTRAVENOUS
  Filled 2016-03-29 (×2): qty 2.5

## 2016-03-29 MED ORDER — SODIUM CHLORIDE 0.9% FLUSH: 3.0000 mL | Freq: Two times a day (BID) | INTRAVENOUS | Status: DC

## 2016-03-29 MED ORDER — HEPARIN (PORCINE) IN NACL 2-0.9 UNIT/ML-% IJ SOLN
INTRAMUSCULAR | Status: AC
  Filled 2016-03-29: qty 500

## 2016-03-29 MED ORDER — STERILE WATER FOR INJECTION IV SOLN
INTRAVENOUS | Status: DC
  Administered 2016-03-30 (×2): via INTRAVENOUS
  Filled 2016-03-29 (×5): qty 850

## 2016-03-29 SURGICAL SUPPLY — 32 items
BALLN TREK OTW 2X8 (BALLOONS) ×3
BALLN TREK RX 2.25X12 (BALLOONS) ×3
BALLN TREK RX 2.5X15 (BALLOONS) ×3
BALLOON TREK OTW 2X8 (BALLOONS) ×1 IMPLANT
BALLOON TREK RX 2.25X12 (BALLOONS) ×1 IMPLANT
BALLOON TREK RX 2.5X15 (BALLOONS) ×1 IMPLANT
CABLE ADAPT CONN TEMP 6FT (ADAPTER) ×3 IMPLANT
CATH 5FR JL4 DIAGNOSTIC (CATHETERS) ×2 IMPLANT
CATH 5FR JR4 DIAGNOSTIC (CATHETERS) ×3 IMPLANT
CATH IAB 7FR 40ML (CATHETERS) ×3 IMPLANT
CATH VISTA GUIDE 6FR JR4 (CATHETERS) ×3 IMPLANT
DEVICE INFLAT 30 PLUS (MISCELLANEOUS) ×3 IMPLANT
DEVICE SECURE STATLOCK IABP (MISCELLANEOUS) ×6 IMPLANT
GLIDESHEATH SLEND SS 6F .021 (SHEATH) ×3 IMPLANT
KIT MANI 3VAL PERCEP (MISCELLANEOUS) ×3 IMPLANT
KIT TRANSPAC II SGL 4260605 (MISCELLANEOUS) ×3 IMPLANT
NEEDLE PERC 18GX7CM (NEEDLE) ×3 IMPLANT
PACK CARDIAC CATH (CUSTOM PROCEDURE TRAY) ×3 IMPLANT
SET INTRO CAPELLA COAXIAL (SET/KITS/TRAYS/PACK) ×3 IMPLANT
SHEATH AVANTI 6FR X 11CM (SHEATH) ×3 IMPLANT
SLEEVE REPOSITIONING LENGTH 30 (MISCELLANEOUS) ×3 IMPLANT
STENT RESOLUTE INTEG 2.75X22 (Permanent Stent) ×3 IMPLANT
STENT RESOLUTE INTEG 3.0X12 (Permanent Stent) ×3 IMPLANT
STENT RESOLUTE INTEG 3.0X30 (Permanent Stent) ×3 IMPLANT
WIRE ASAHI FIELDER XT 300CM (WIRE) ×3 IMPLANT
WIRE EMERALD 3MM-J .035X150CM (WIRE) ×3 IMPLANT
WIRE HI TORQ WHISPER MS 190CM (WIRE) ×3 IMPLANT
WIRE HITORQ VERSACORE ST 145CM (WIRE) ×3 IMPLANT
WIRE PACING TEMP ST TIP 5 (CATHETERS) ×3 IMPLANT
WIRE ROSEN-J .035X260CM (WIRE) ×3 IMPLANT
WIRE RUNTHROUGH .014X180CM (WIRE) ×3 IMPLANT
WIRE RUNTHROUGH .014X300CM (WIRE) ×6 IMPLANT

## 2016-03-29 NOTE — H&P (Addendum)
PULMONARY / CRITICAL CARE MEDICINE   Name: GISSELA BLOCH MRN: [id number] DOB: 28-Apr-1960    ADMISSION DATE:  04-01-2016 CONSULTATION DATE:    REFERRING MD:  [facility]  CHIEF COMPLAINT:  Cardiac arrest  HISTORY OF PRESENT ILLNESS:   Ms. Fryberger is a 62F with PMH significant for COPD, prior stroke, DMII, dyslipidemia and hypertension who was found down at home by her daughter. It is unknown how long she was down. EMS was called and found patient in asystole. ACLS initiated and ROSC after ~74m. En route to ED reportedly coded again, with ROSC after brief period of ACLS. On arrival to ED, ECG showed acute ST elevations in 2, 3 and AVF with bundle branch block. She was intubated. She was in shock and required multiple pressors. She was unresponsive and CT head was suggestive of early anoxic injury. Nevertheless, she was taken to the cath lab and 3 overlapping drug-eluting stents were placed in the ostial RCA. IABP and transvenous pacer were also inserted. She continued to require pressor support with dopamine, norepi, and epinephrine and was also initiated on a bicarb gtt. She remained unresponsive despite not receiving any sedation. Post IABP placement, pedal pulses were absent.   On arrival to [facility], IABP is 1:1, pacer is AV DDD at 80 with 5mA output. She remains on dopamine, norepi, epi, bicarb, and aggrastat. She is not receiving sedation and remains unresponsive. Pupils are 7mm and non-reactive bilaterally. Her feet (R > L) are becoming mottled and are cold without palpable pulses.   I called her daughter, Cherlynn Kaiser, her primary contact, and went over Ms. Kundinger's current condition. I encouraged her to speak with other family members and to discuss goals of care given the patient's extremely poor prognosis. We discussed code status, but she wishes to speak with other family members before making a decision. She will try to come to the hospital tonight and have a decision at that time.   PAST MEDICAL HISTORY :   She  has a past medical history of Anxiety; Asthma; Coarse tremors; COPD (chronic obstructive pulmonary disease) (HCC); CVA (cerebral vascular accident) (HCC); Diabetes mellitus without complication (HCC); Hyperlipidemia; Hypertension; and Insomnia.  PAST SURGICAL HISTORY: She  has a past surgical history that includes Back surgery; Foot surgery; Bronchoscopy; Cervical spine surgery; and Cholecystectomy.  Allergies  Allergen Reactions  . Lyrica [Pregabalin] Other (See Comments)    hallucination  . Penicillins Hives    Has patient had a PCN reaction causing immediate rash, facial/tongue/throat swelling, SOB or lightheadedness with hypotension: No Has patient had a PCN reaction causing severe rash involving mucus membranes or skin necrosis: No Has patient had a PCN reaction that required hospitalization No Has patient had a PCN reaction occurring within the last 10 years: Yes If all of the above answers are "NO", then may proceed with Cephalosporin use.   . Prozac [Fluoxetine Hcl] Other (See Comments)    hallucinations  . Vicodin [Hydrocodone-Acetaminophen] Rash    No current facility-administered medications on file prior to encounter.    Current Outpatient Prescriptions on File Prior to Encounter  Medication Sig  . albuterol (PROVENTIL) (2.5 MG/3ML) 0.083% nebulizer solution Take 2.5 mg by nebulization every 6 (six) hours as needed for wheezing or shortness of breath.  Marland Kitchen albuterol-ipratropium (COMBIVENT) 18-103 MCG/ACT inhaler Inhale 1 puff into the lungs every 6 (six) hours as needed for wheezing or shortness of breath.  . ALPRAZolam (XANAX) 1 MG tablet Take 0.5 tablets (0.5 mg total) by mouth 2 (two)  times daily as needed for anxiety.  Marland Kitchen. amLODipine (NORVASC) 10 MG tablet Take 10 mg by mouth daily.  Marland Kitchen. aspirin EC 325 MG EC tablet Take 1 tablet (325 mg total) by mouth daily.  Marland Kitchen. atorvastatin (LIPITOR) 40 MG tablet Take 40 mg by mouth daily.   . chlorthalidone (HYGROTON) 25 MG tablet  Take 12.5 mg by mouth every morning.  Marland Kitchen. doxycycline (VIBRA-TABS) 100 MG tablet Take 1 tablet (100 mg total) by mouth 2 (two) times daily.  . fenofibrate 160 MG tablet Take 1 tablet (160 mg total) by mouth daily.  . fluticasone (FLONASE) 50 MCG/ACT nasal spray Place 2 sprays into both nostrils daily as needed for rhinitis.  . Fluticasone-Salmeterol (ADVAIR) 250-50 MCG/DOSE AEPB Inhale 1 puff into the lungs 2 (two) times daily.  Marland Kitchen. gabapentin (NEURONTIN) 300 MG capsule Take 2 capsules (600 mg total) by mouth 2 (two) times daily. (Patient taking differently: Take 600 mg by mouth 3 (three) times daily. )  . insulin aspart (NOVOLOG) 100 UNIT/ML injection Inject 0-5 Units into the skin at bedtime. (Patient taking differently: Inject 21 Units into the skin 3 (three) times daily with meals. )  . insulin glargine (LANTUS) 100 unit/mL SOPN Inject 0.25 mLs (25 Units total) into the skin at bedtime. (Patient taking differently: Inject 38 Units into the skin 2 (two) times daily. )  . lisinopril (PRINIVIL,ZESTRIL) 2.5 MG tablet Take 2.5 mg by mouth daily.  . magnesium oxide (MAG-OX) 400 (241.3 Mg) MG tablet Take 400 mg by mouth 2 (two) times daily.   . Melatonin 3 MG TABS Take 1 tablet by mouth at bedtime.  . metoprolol tartrate (LOPRESSOR) 25 MG tablet Take 25 mg by mouth 2 (two) times daily.  Marland Kitchen. oxyCODONE-acetaminophen (PERCOCET) 7.5-325 MG tablet Take 1 tablet by mouth every 12 (twelve) hours as needed for severe pain. Every 4-6 hours (Patient taking differently: Take 1 tablet by mouth every 6 (six) hours as needed for severe pain. Every 4-6 hours)  . tiotropium (SPIRIVA) 18 MCG inhalation capsule Place 18 mcg into inhaler and inhale daily.  . traZODone (DESYREL) 100 MG tablet Take 100 mg by mouth at bedtime as needed for sleep.   Marland Kitchen. vortioxetine HBr (TRINTELLIX) 10 MG TABS Take 1 tablet by mouth daily.  Marland Kitchen. zolpidem (AMBIEN) 10 MG tablet Take 10 mg by mouth at bedtime as needed for sleep.    FAMILY HISTORY:   Her indicated that her mother is deceased. She indicated that her father is deceased.    SOCIAL HISTORY: She  reports that she has been smoking Cigarettes.  She has been smoking about 0.50 packs per day. She has never used smokeless tobacco. She reports that she does not drink alcohol or use drugs.  REVIEW OF SYSTEMS:   Unable to obtain secondary to patient's mental status, intubated state  SUBJECTIVE:    VITAL SIGNS: BP (!) 74/48   Temp (!) 96.3 F (35.7 C) (Core (Comment)) Comment (Src): temp foley  Resp (!) 28   Ht 5\' 6"  (1.676 m)   Wt 82.3 kg (181 lb 7 oz)   SpO2 100% Comment: Sp02 off ABG results  BMI 29.28 kg/m   HEMODYNAMICS:    VENTILATOR SETTINGS: Vent Mode: PRVC FiO2 (%):  [60 %-100 %] 60 % Set Rate:  [16 bmp-24 bmp] 24 bmp Vt Set:  [450 mL] 450 mL PEEP:  [5 cmH20] 5 cmH20 Plateau Pressure:  [17 cmH20] 17 cmH20  INTAKE / OUTPUT: No intake/output data recorded.  PHYSICAL EXAMINATION:  General Well nourished, well developed, obese, unresponsive,   HEENT No gross abnormalities. OETT, OGT in place.   Pulmonary Clear to auscultation bilaterally with no wheezes, rales or ronchi. Good effort, symmetrical expansion.   Cardiovascular Paced @ 80bpm. S1, s2 largely obscured by 1:1 IABP. Radial pulses faintly palpable. Pedal pulses unable to be palpated or dopplered. Sheath in place R groin.   Abdomen Soft, non-tender, non-distended, positive bowel sounds, no palpable organomegaly or masses. Normoresonant to percussion.  Musculoskeletal Grossly normal.   Lymphatics No cervical, supraclavicular or axillary adenopathy.   Neurologic Pupils fixed and dilated. No cough. No gag. No spontaneous movement. No response to noxious stimuli.    Skin/Integuement No rash, no clubbing. Early mottling of bilateral lower extremities, R > L.     LABS:  BMET  Recent Labs Lab 03/25/16 1401 03/26/16 0021 03/18/2016 1605  NA 140 139 135  K 3.9 4.1 4.4  CL 105 105 102  CO2 31 28  21*  BUN 23* 23* 16  CREATININE 0.96 1.17* 1.51*  GLUCOSE 198* 212* 365*    Electrolytes  Recent Labs Lab 03/25/16 1401 03/26/16 0021 03/16/2016 1605  CALCIUM 8.8* 8.4* 8.1*    CBC  Recent Labs Lab 03/25/16 1029 03/26/16 0021 03/19/2016 1605  WBC 10.4 11.0 21.8*  HGB 11.9* 11.1* 10.3*  HCT 34.9* 32.0* 32.7*  PLT 263 260 244    Coag's  Recent Labs Lab 03/08/2016 1605  APTT 38*  INR 1.13    Sepsis Markers  Recent Labs Lab 03/15/2016 1611  LATICACIDVEN 7.1*    ABG  Recent Labs Lab 03/12/2016 1631 03/21/2016 1636 03/24/2016 1900  PHART 7.06* 7.10* 7.16*  PCO2ART 64* 51* 48  PO2ART 204* 50* 77*    Liver Enzymes  Recent Labs Lab 03/26/16 0021 03/25/2016 1605  AST 16 76*  ALT 10* 42  ALKPHOS 96 127*  BILITOT 0.3 0.5  ALBUMIN 3.0* 2.4*    Cardiac Enzymes  Recent Labs Lab 03/25/16 1029 03/13/2016 1605  TROPONINI <0.03 <0.03    Glucose  Recent Labs Lab 03/25/16 1013 03/25/16 1358 03/25/16 2329 03/26/16 0716 03/11/2016 1605 03/23/2016 2233  GLUCAP 425* 228* 214* 228* 354* 519*    Imaging Dg Chest 1 View  Result Date: 03/09/2016 CLINICAL DATA:  Cardiac arrest. Intubation. Myocardial infarct. COPD. EXAM: CHEST 1 VIEW COMPARISON:  03/05/2016 FINDINGS: Endotracheal tube is seen in appropriate position. Nasogastric tube is seen entering the stomach. Heart size is within normal limits. Mild asymmetric opacity is seen in the left upper lobe, suspicious for pneumonia or aspiration. No evidence of pneumothorax or pleural effusion. Cervical spine fusion hardware again noted. IMPRESSION: Endotracheal tube and nasogastric tube in appropriate position. Mild asymmetric left upper lobe opacity, suspicious for pneumonia or aspiration. Electronically Signed   By: Myles Rosenthal M.D.   On: 03/07/2016 16:49   Ct Head Wo Contrast  Result Date: 03/31/2016 CLINICAL DATA:  Patient found down.  Code STEMI EXAM: CT HEAD WITHOUT CONTRAST TECHNIQUE: Contiguous axial images were  obtained from the base of the skull through the vertex without intravenous contrast. COMPARISON:  CT head 03/25/2016 FINDINGS: Brain: Ventricle size normal. Negative for hemorrhage. Negative for mass lesion Poor differentiation of gray and white matter suggesting anoxic injury to the brain. This is an interval change since the prior study. Close followup is warranted. Vascular: No hyperdense vessel or unexpected calcification. Skull: Negative Sinuses/Orbits: Negative Other: None IMPRESSION: Poor differentiation of gray matter and white matter suggestive of anoxic injury to the brain. Negative  for hemorrhage. Clinical correlation and follow-up CT recommended to confirm. These results were called by telephone at the time of interpretation on 04-06-16 at 4:49 pm to Dr. Jene Every , who verbally acknowledged these results. Electronically Signed   By: Marlan Palau M.D.   On: 04-06-16 16:50   Dg Abd Portable 1 View  Result Date: 04/06/16 CLINICAL DATA:  Gastric tube placement EXAM: PORTABLE ABDOMEN - 1 VIEW COMPARISON:  None. FINDINGS: NG tube in the stomach with the tip near the antrum of the stomach. Normal bowel gas pattern. Lumbar fusion with hardware at L4-5 IMPRESSION: NG tube in the gastric antrum. Electronically Signed   By: Marlan Palau M.D.   On: April 06, 2016 16:46   STUDIES:  As above  CULTURES: None  ANTIBIOTICS: None  SIGNIFICANT EVENTS: Cath lab - 3 stents to RCA IABP Transvenous pacer  LINES/TUBES: OETT OGT IABP R tibial IO Arterial line  DISCUSSION: Ms. Patteson is a 2F with cardiac arrest secondary to acute STEMI, s/p attempt at revascularization with 3 DES to RCA and salvage measures with IABP and pacer for complete heart block. She is in profound cardiogenic shock requiring multiple pressors. Unknown downtime with initial rhythm asystole. Her CT is suggestive of anoxic injury and she clinically appears to be neurologically devastated. Discussions regarding goals of  care ongoing with family.   ASSESSMENT / PLAN:  PULMONARY A: Need for intubation 2/2 cardiac arrest and inability to protect airway P:   Continue ventilatory support Wean as tolerated  CARDIOVASCULAR A:  Cardiogenic shock; IABP in place 1:1 STEMI s/p DES x 3 to RCA Cardiac arrest - initial rhythm asystole Complete heart block - AV paced, DDD, 5 mA @ 80bpm P:  Continue supportive measures with IABP, pressors, pacer Continue aggrastat until 2am, then transition to heparin gtt per cardiology Cardiology to follow 36 degree protocol given circumstances as outlined above; therapeutic hypothermia is not appropriate in this setting.  RENAL A:   Acute kidney injury Severe metabolic (lactic) acidosis P:   Trend Cr Continue bicarb gtt Pressors / cardiac support to achieve adequate perfusion Avoid nephrotoxins  GASTROINTESTINAL A:   No acute issues P:   NPO  HEMATOLOGIC A:   No acute issues Chronic anemia Leukocytosis - reactive P:  Trend CBC Heparin gtt per cardiology to start around 2am  INFECTIOUS A:   No acute issues P:    ENDOCRINE A:   DMII   P:   CBGs/insulin as needed  NEUROLOGIC A:   Concern for global anoxic injury  P:   RASS goal: 0 Monitor neuro exam Involve neurology if myoclonus evolves   FAMILY  - Updates: Daughter, Leanne Chang, by phone. She wants to talk to other family members before rendering a decision regarding code status. Patient to remain FULL CODE pending further discussion.   - Inter-disciplinary family meet or Palliative Care meeting due by:  day 7  The patient is critically ill with multiple organ system failure and requires high complexity decision making for assessment and support, frequent evaluation and titration of therapies, advanced monitoring, review of radiographic studies and interpretation of complex data.   Critical Care Time devoted to patient care services, exclusive of separately billable procedures, described in  this note is 68 minutes.   Nita Sickle, MD Pulmonary and Critical Care Medicine [facility] Pager: [phone]  April 06, 2016, 11:11 PM  ADDENDUM: Discussion with family - daughters Crystal and Leanne Chang, with multiple other family members present. We discussed her prognosis, current need  for multiple forms of life support, declining trajectory since arriving at [facility] and options for continuing care. They clearly stated that Ms. Ryback would never want to be sustained with machines and that she valued quality of life over quantity. They agree that DNR status is appropriate at this point. They have some additional family members who would like to come to the bedside, and are not quite ready to transition to comfort care at this point, but will likely make that transition in the next day or two. Chaplain offered, but they declined. All questions answered.   Nita Sickle, MD Pulmonary and Critical Care 03/22/2016 12:33 AM

## 2016-03-29 NOTE — ED Notes (Signed)
18FR OG inserted by Paulino RilyAshley RN. Reddish-brown fluid noted.

## 2016-03-29 NOTE — ED Notes (Signed)
Stemi noted on monitor.

## 2016-03-29 NOTE — Plan of Care (Signed)
Patient arrived safely from [place] but remains on robust vasopressor support with norepinephrine gtt, epinephrine gtt, and dopamine gtt along with IABP at 1:1.  Pupils are fixed and dilated and patient has not demonstrated movement, bilateral LE are cold to the touch with feet demonstrating slight mottling.  Discussed case with Dr. Okey DupreEnd after arrival, and plan to continue aggrastat gtt until 2am and then transition to heparin gtt.  Also ordered CXR to confirm IABP positioning.   Azalee CoursePaul S. Massie Cogliano MD

## 2016-03-29 NOTE — ED Notes (Signed)
Per EMS report, on scene blood sugar was 261.

## 2016-03-29 NOTE — H&P (Signed)
Name: [name] MRN: [id number] DOB: Feb 19, 1960    ADMISSION DATE:  04/02/2016 CHIEF COMPLAINT:  Acute cardiac arrest  BRIEF PATIENT DESCRIPTION:  56 yo white female seen in ER s/p cardiac arrest h/o drug abuse  SIGNIFICANT EVENTS  2/24 cardiac arrest 2/24 intubated 2/24 CT head pending, admit to ICU for medical management  STUDIES:  EKG STEMI inferior leads   HISTORY OF PRESENT ILLNESS:  56 yo white female found unresponsive by daughter, unknown downtime EMS arrived patient in PEA cardiac arrest. CPR started-lasted approx 30 mins Patient intubated 7.5 ETT, patient in severe SHOCK-placed on multiple vasopressors EKG shows STEMI ST elevations in inferior leads-cardiology called for emergent cath  CT head pending  PAST MEDICAL HISTORY :   has a past medical history of Anxiety; Asthma; Coarse tremors; COPD (chronic obstructive pulmonary disease) ([place]); CVA (cerebral vascular accident) ([place]); Diabetes mellitus without complication ([place]); Hyperlipidemia; Hypertension; and Insomnia.  has a past surgical history that includes Back surgery; Foot surgery; Bronchoscopy; Cervical spine surgery; and Cholecystectomy. Prior to Admission medications   Medication Sig Start Date End Date Taking? Authorizing Provider  albuterol (PROVENTIL) (2.5 MG/3ML) 0.083% nebulizer solution Take 2.5 mg by nebulization every 6 (six) hours as needed for wheezing or shortness of breath.   Yes Historical Provider, MD  albuterol-ipratropium (COMBIVENT) 18-103 MCG/ACT inhaler Inhale 1 puff into the lungs every 6 (six) hours as needed for wheezing or shortness of breath.   Yes Historical Provider, MD  ALPRAZolam Duanne Moron) 1 MG tablet Take 0.5 tablets (0.5 mg total) by mouth 2 (two) times daily as needed for anxiety. 03/06/16  Yes Srikar Sudini, MD  amLODipine (NORVASC) 10 MG tablet Take 10 mg by mouth daily.   Yes Historical Provider, MD  aspirin EC 325 MG EC tablet Take 1 tablet (325 mg total) by mouth daily.  12/22/15  Yes Epifanio Lesches, MD  atorvastatin (LIPITOR) 40 MG tablet Take 40 mg by mouth daily.    Yes Historical Provider, MD  chlorthalidone (HYGROTON) 25 MG tablet Take 12.5 mg by mouth every morning.   Yes Historical Provider, MD  doxycycline (VIBRA-TABS) 100 MG tablet Take 1 tablet (100 mg total) by mouth 2 (two) times daily. 03/06/16  Yes Srikar Sudini, MD  fenofibrate 160 MG tablet Take 1 tablet (160 mg total) by mouth daily. 01/12/16  Yes Dustin Flock, MD  fluticasone (FLONASE) 50 MCG/ACT nasal spray Place 2 sprays into both nostrils daily as needed for rhinitis.   Yes Historical Provider, MD  Fluticasone-Salmeterol (ADVAIR) 250-50 MCG/DOSE AEPB Inhale 1 puff into the lungs 2 (two) times daily.   Yes Historical Provider, MD  gabapentin (NEURONTIN) 300 MG capsule Take 2 capsules (600 mg total) by mouth 2 (two) times daily. Patient taking differently: Take 600 mg by mouth 3 (three) times daily.  12/21/15  Yes Epifanio Lesches, MD  insulin aspart (NOVOLOG) 100 UNIT/ML injection Inject 0-5 Units into the skin at bedtime. Patient taking differently: Inject 21 Units into the skin 3 (three) times daily with meals.  12/21/15  Yes Epifanio Lesches, MD  insulin glargine (LANTUS) 100 unit/mL SOPN Inject 0.25 mLs (25 Units total) into the skin at bedtime. Patient taking differently: Inject 38 Units into the skin 2 (two) times daily.  03/06/16  Yes Srikar Sudini, MD  lisinopril (PRINIVIL,ZESTRIL) 2.5 MG tablet Take 2.5 mg by mouth daily.   Yes Historical Provider, MD  magnesium oxide (MAG-OX) 400 (241.3 Mg) MG tablet Take 400 mg by mouth 2 (two) times daily.  Yes Historical Provider, MD  Melatonin 3 MG TABS Take 1 tablet by mouth at bedtime.   Yes Historical Provider, MD  metoprolol tartrate (LOPRESSOR) 25 MG tablet Take 25 mg by mouth 2 (two) times daily.   Yes Historical Provider, MD  oxyCODONE-acetaminophen (PERCOCET) 7.5-325 MG tablet Take 1 tablet by mouth every 12 (twelve) hours as  needed for severe pain. Every 4-6 hours Patient taking differently: Take 1 tablet by mouth every 6 (six) hours as needed for severe pain. Every 4-6 hours 01/11/16  Yes Dustin Flock, MD  tiotropium (SPIRIVA) 18 MCG inhalation capsule Place 18 mcg into inhaler and inhale daily.   Yes Historical Provider, MD  traZODone (DESYREL) 100 MG tablet Take 100 mg by mouth at bedtime as needed for sleep.    Yes Historical Provider, MD  vortioxetine HBr (TRINTELLIX) 10 MG TABS Take 1 tablet by mouth daily.   Yes Historical Provider, MD  zolpidem (AMBIEN) 10 MG tablet Take 10 mg by mouth at bedtime as needed for sleep.   Yes Historical Provider, MD   Allergies  Allergen Reactions  . Lyrica [Pregabalin] Other (See Comments)    hallucination  . Penicillins Hives    Has patient had a PCN reaction causing immediate rash, facial/tongue/throat swelling, SOB or lightheadedness with hypotension: No Has patient had a PCN reaction causing severe rash involving mucus membranes or skin necrosis: No Has patient had a PCN reaction that required hospitalization No Has patient had a PCN reaction occurring within the last 10 years: Yes If all of the above answers are "NO", then may proceed with Cephalosporin use.   . Prozac [Fluoxetine Hcl] Other (See Comments)    hallucinations  . Vicodin [Hydrocodone-Acetaminophen] Rash    FAMILY HISTORY:  family history includes CAD in her mother; Hypertension in her mother; Throat cancer in her father. SOCIAL HISTORY:  reports that she has been smoking Cigarettes.  She has been smoking about 0.50 packs per day. She has never used smokeless tobacco. She reports that she does not drink alcohol or use drugs.  REVIEW OF SYSTEMS:   Unobtainable due to critical illness   VITAL SIGNS: Pulse Rate:  [48] 48 (02/24 1600) Resp:  [22] 22 (02/24 1600) BP: (63)/(51) 63/51 (02/24 1600) SpO2:  [100 %] 100 % (02/24 1605) FiO2 (%):  [100 %] 100 % (02/24 1605)   PHYSICAL  EXAMINATION:  GENERAL:critically ill appearing, +resp distress HEAD: Normocephalic, atraumatic.  EYES: Pupils equal, round, reactive to light.  No scleral icterus.  MOUTH: Moist mucosal membrane. NECK: Supple. No thyromegaly. No nodules. No JVD.  PULMONARY: +rhonchi, +wheezing CARDIOVASCULAR: S1 and S2. Regular rate and rhythm. No murmurs, rubs, or gallops.  GASTROINTESTINAL: Soft, nontender, -distended. No masses. Positive bowel sounds. No hepatosplenomegaly.  MUSCULOSKELETAL: No swelling, clubbing, or edema.  NEUROLOGIC:gcs<8T SKIN:intact,warm,dry     Recent Labs Lab 03/25/16 1029 03/25/16 1401 03/26/16 0021  NA 137 140 139  K 5.2* 3.9 4.1  CL 101 105 105  CO2 29 31 28   BUN 23* 23* 23*  CREATININE 1.21* 0.96 1.17*  GLUCOSE 437* 198* 212*    Recent Labs Lab 03/25/16 1029 03/26/16 0021 04/02/2016 1605  HGB 11.9* 11.1* 10.3*  HCT 34.9* 32.0* 32.7*  WBC 10.4 11.0 21.8*  PLT 263 260 244    ASSESSMENT / PLAN: 56 yo white female with acute cardiac arrest with severe hypoxia findings to suggest inferior MI with severe cardiogenic shock with multiorgan failure with severe met acidosis  1.obtain CT head to assess for  CVA/bleed -this will assist with management with anticoagulation and starting hypothermia protocol 2.emergent cardiac cath-cardiology on their way 3.will need obtain CVL 4.ICU admission versus transfer to [facility] for balloon pump management 5.vasopressors to keep MAP>65 6.start Bicarb Infusion  Family unavailable at this time Critical Care Time devoted to patient care services described in this note is 60 minutes.   Overall, patient is critically ill, prognosis is guarded.  Patient with Multiorgan failure and at high risk for cardiac arrest and death.    Corrin Parker, M.D.  [facility] Pulmonary & Critical Care Medicine  Medical Director [place] Director [facility] Cardio-Pulmonary Department

## 2016-03-29 NOTE — Progress Notes (Signed)
Increased fio2 to 70% post repeat abg, per Dr. Belia HemanKasa.  Will continue to monitor.

## 2016-03-29 NOTE — Progress Notes (Signed)
ANTICOAGULATION CONSULT NOTE - Initial Consult  Pharmacy Consult for heparin Drip  Indication: chest pain/ACS  Allergies  Allergen Reactions  . Lyrica [Pregabalin] Other (See Comments)    hallucination  . Penicillins Hives    Has patient had a PCN reaction causing immediate rash, facial/tongue/throat swelling, SOB or lightheadedness with hypotension: No Has patient had a PCN reaction causing severe rash involving mucus membranes or skin necrosis: No Has patient had a PCN reaction that required hospitalization No Has patient had a PCN reaction occurring within the last 10 years: Yes If all of the above answers are "NO", then may proceed with Cephalosporin use.   . Prozac [Fluoxetine Hcl] Other (See Comments)    hallucinations  . Vicodin [Hydrocodone-Acetaminophen] Rash    Patient Measurements:     Vital Signs: Temp: 99.2 F (37.3 C) (02/24 1637) Temp Source: Rectal (02/24 1637) BP: 66/47 (02/24 1637) Pulse Rate: 48 (02/24 1637)  Labs:  Recent Labs  03/10/2016 1605  HGB 10.3*  HCT 32.7*  PLT 244  APTT 38*  LABPROT 14.6  INR 1.13  CREATININE 1.51*  TROPONINI <0.03    Estimated Creatinine Clearance: 42.9 mL/min (by C-G formula based on SCr of 1.51 mg/dL (H)).   Medical History: Past Medical History:  Diagnosis Date  . Anxiety   . Asthma   . Coarse tremors   . COPD (chronic obstructive pulmonary disease) (HCC)    on 3L home o2  . CVA (cerebral vascular accident) (HCC)   . 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 Hyperlipidemia   . Hypertension   . Insomnia    Assessment: Pharmacy consulted to dose heparin in 56 yo with ACS.  Goal of Therapy:  Heparin level 0.3-0.7 units/ml Monitor platelets by anticoagulation protocol: Yes   Plan: Baseline labs ordered  Give 4000 units bolus x 1 Start heparin infusion at 850 units/hr Check anti-Xa level in 6 hours and daily while on heparin Continue to monitor H&H and  platelets  Gardner CandleSheema M Keturah Yerby, PharmD, BCPS Clinical Pharmacist 03/22/2016 8:50 PM

## 2016-03-29 NOTE — Progress Notes (Signed)
Patient intubated by Dr.Kinner with a #7.5 oral et tube with the glidescope on the first attempt. Placement confirmed by bilateral breath sounds, color change on the Co2 detector, and CXR. Tube was secured at 21cm at the uuper lip.

## 2016-03-29 NOTE — ED Notes (Signed)
Patient returned from CT

## 2016-03-29 NOTE — ED Notes (Signed)
Patient taken to CT scan.

## 2016-03-29 NOTE — Consult Note (Signed)
Cardiology Consultation Note    Patient ID: [name], MRN: [id number], DOB/AGE: Aug 14, 1960 56 y.o. Admit date: 03/15/2016   Date of Consult: 03/12/2016 Primary Physician: Nancy Nordmann, MD Primary Cardiologist: [facility] - [facility]  Chief Complaint: Cardiac arrest Reason for Consultation: STEMI and cardiogenic shock Requesting MD: Jene Every, MD  HPI: [name] is a 56 y.o. female with history of stroke, diabetes, hypertension, hyperlipidemia, COPD, and anxiety, who presented to the emergency department after being found down by her daughter. Time down is unknown. EMS began CPR and achieved ROSC after approximately 30 minutes. She lost her pulse again in transport to [facility] ED but had a perfusing rhythm upon arrival. She was markedly hypotensive on dopamine and norepinephrine. EKG demonstrates junctional rhythm with inferior ST elevation and posterior changes. She was unresponsive on the ventilator without having received sedation. Patient was brought to the cath lab after discussing her condition with her daughter. She was found to have occluded ostial RCA that was successfully revascularized with 3 overlapping drug-eluting stents. Intra-aortic balloon pump and temporary transvenous pacemaker were also placed. Of note, bilateral peripheral vascular disease was noted with at least 50% of the right common femoral artery (site of balloon pump placement). Post catheterization, the patient remains on norepinephrine, dopamine, and epinephrine with marginal blood pressures. She is also intubated and unresponsive despite not receiving sedation.  Past Medical History:  Diagnosis Date  . Anxiety   . Asthma   . Coarse tremors   . COPD (chronic obstructive pulmonary disease) (HCC)    on 3L home o2  . CVA (cerebral vascular accident) (HCC)   . 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 Hyperlipidemia   . Hypertension   . Insomnia       Surgical History:  Past  Surgical History:  Procedure Laterality Date  . BACK SURGERY     x 2 lumbar spine  . BRONCHOSCOPY    . CERVICAL SPINE SURGERY    . CHOLECYSTECTOMY    . FOOT SURGERY       Home Meds: Prior to Admission medications   Medication Sig Start Date Holley [place] Date Taking? Authorizing Provider  albuterol (PROVENTIL) (2.5 MG/3ML) 0.083% nebulizer solution Take 2.5 mg by nebulization every 6 (six) hours as needed for wheezing or shortness of breath.   Yes Historical Provider, MD  albuterol-ipratropium (COMBIVENT) 18-103 MCG/ACT inhaler Inhale 1 puff into the lungs every 6 (six) hours as needed for wheezing or shortness of breath.   Yes Historical Provider, MD  ALPRAZolam Prudy Feeler) 1 MG tablet Take 0.5 tablets (0.5 mg total) by mouth 2 (two) times daily as needed for anxiety. 03/06/16  Yes Srikar Sudini, MD  amLODipine (NORVASC) 10 MG tablet Take 10 mg by mouth daily.   Yes Historical Provider, MD  aspirin EC 325 MG EC tablet Take 1 tablet (325 mg total) by mouth daily. 12/22/15  Yes Katha Hamming, MD  atorvastatin (LIPITOR) 40 MG tablet Take 40 mg by mouth daily.    Yes Historical Provider, MD  chlorthalidone (HYGROTON) 25 MG tablet Take 12.5 mg by mouth every morning.   Yes Historical Provider, MD  doxycycline (VIBRA-TABS) 100 MG tablet Take 1 tablet (100 mg total) by mouth 2 (two) times daily. 03/06/16  Yes Srikar Sudini, MD  fenofibrate 160 MG tablet Take 1 tablet (160 mg total) by mouth daily. 01/12/16  Yes Auburn Bilberry, MD  fluticasone (FLONASE) 50 MCG/ACT nasal spray Place 2 sprays into both  nostrils daily as needed for rhinitis.   Yes Historical Provider, MD  Fluticasone-Salmeterol (ADVAIR) 250-50 MCG/DOSE AEPB Inhale 1 puff into the lungs 2 (two) times daily.   Yes Historical Provider, MD  gabapentin (NEURONTIN) 300 MG capsule Take 2 capsules (600 mg total) by mouth 2 (two) times daily. Patient taking differently: Take 600 mg by mouth 3 (three) times daily.  12/21/15  Yes Katha Hamming, MD    insulin aspart (NOVOLOG) 100 UNIT/ML injection Inject 0-5 Units into the skin at bedtime. Patient taking differently: Inject 21 Units into the skin 3 (three) times daily with meals.  12/21/15  Yes Katha Hamming, MD  insulin glargine (LANTUS) 100 unit/mL SOPN Inject 0.25 mLs (25 Units total) into the skin at bedtime. Patient taking differently: Inject 38 Units into the skin 2 (two) times daily.  03/06/16  Yes Srikar Sudini, MD  lisinopril (PRINIVIL,ZESTRIL) 2.5 MG tablet Take 2.5 mg by mouth daily.   Yes Historical Provider, MD  magnesium oxide (MAG-OX) 400 (241.3 Mg) MG tablet Take 400 mg by mouth 2 (two) times daily.    Yes Historical Provider, MD  Melatonin 3 MG TABS Take 1 tablet by mouth at bedtime.   Yes Historical Provider, MD  metoprolol tartrate (LOPRESSOR) 25 MG tablet Take 25 mg by mouth 2 (two) times daily.   Yes Historical Provider, MD  oxyCODONE-acetaminophen (PERCOCET) 7.5-325 MG tablet Take 1 tablet by mouth every 12 (twelve) hours as needed for severe pain. Every 4-6 hours Patient taking differently: Take 1 tablet by mouth every 6 (six) hours as needed for severe pain. Every 4-6 hours 01/11/16  Yes Auburn Bilberry, MD  tiotropium (SPIRIVA) 18 MCG inhalation capsule Place 18 mcg into inhaler and inhale daily.   Yes Historical Provider, MD  traZODone (DESYREL) 100 MG tablet Take 100 mg by mouth at bedtime as needed for sleep.    Yes Historical Provider, MD  vortioxetine HBr (TRINTELLIX) 10 MG TABS Take 1 tablet by mouth daily.   Yes Historical Provider, MD  zolpidem (AMBIEN) 10 MG tablet Take 10 mg by mouth at bedtime as needed for sleep.   Yes Historical Provider, MD    Inpatient Medications:  . aspirin  300 mg Rectal NOW  . fentaNYL (SUBLIMAZE) injection  50 mcg Intravenous Once  . fentaNYL (SUBLIMAZE) injection  50 mcg Intravenous Once  . heparin  5,000 Units Subcutaneous Q8H  . norepinephrine (LEVOPHED) Adult infusion  0-40 mcg/min Intravenous Once  . sodium chloride flush   3 mL Intravenous Q12H   . sodium chloride    . sodium chloride    . sodium chloride    . sodium chloride 250 mL (04-11-2016 1814)  . epinephrine    . famotidine (PEPCID) IV    . fentaNYL infusion INTRAVENOUS    . norepinephrine (LEVOPHED) Adult infusion 10 mcg/min (04/11/2016 1620)  .  sodium bicarbonate (isotonic) infusion in sterile water    . tirofiban 0.075 mcg/kg/min (04/11/2016 1823)    Allergies:  Allergies  Allergen Reactions  . Lyrica [Pregabalin] Other (See Comments)    hallucination  . Penicillins Hives    Has patient had a PCN reaction causing immediate rash, facial/tongue/throat swelling, SOB or lightheadedness with hypotension: No Has patient had a PCN reaction causing severe rash involving mucus membranes or skin necrosis: No Has patient had a PCN reaction that required hospitalization No Has patient had a PCN reaction occurring within the last 10 years: Yes If all of the above answers are "NO", then may proceed with  Cephalosporin use.   . Prozac [Fluoxetine Hcl] Other (See Comments)    hallucinations  . Vicodin [Hydrocodone-Acetaminophen] Rash    Social History   Social History  . Marital status: Single    Spouse name: N/A  . Number of children: N/A  . Years of education: N/A   Occupational History  . Not on file.   Social History Main Topics  . Smoking status: Current Every Day Smoker    Packs/day: 0.50    Types: Cigarettes  . Smokeless tobacco: Never Used  . Alcohol use No  . Drug use: No  . Sexual activity: Not on file   Other Topics Concern  . Not on file   Social History Narrative   Lives with daughter, has a walker.     Family History  Problem Relation Age of Onset  . CAD Mother   . Hypertension Mother   . Throat cancer Father      Review of Systems: Unable to perform due to patient's critical illness and unresponsiveness.  Labs:  Recent Labs  04/09/2016 1605  TROPONINI <0.03   Lab Results  Component Value Date   WBC 21.8 (H)  04-09-2016   HGB 10.3 (L) 2016-04-09   HCT 32.7 (L) 04-09-2016   MCV 94.5 04/09/16   PLT 244 04/09/16    Recent Labs Lab April 09, 2016 1605  NA 135  K 4.4  CL 102  CO2 21*  BUN 16  CREATININE 1.51*  CALCIUM 8.1*  PROT 5.5*  BILITOT 0.5  ALKPHOS 127*  ALT 42  AST 76*  GLUCOSE 365*   Lab Results  Component Value Date   CHOL 136 04-09-16   HDL 28 (L) 2016-04-09   LDLCALC 61 04-09-2016   TRIG 234 (H) 04/09/16   No results found for: DDIMER  Radiology/Studies:  Dg Chest 1 View  Result Date: 2016/04/09 CLINICAL DATA:  Cardiac arrest. Intubation. Myocardial infarct. COPD. EXAM: CHEST 1 VIEW COMPARISON:  03/05/2016 FINDINGS: Endotracheal tube is seen in appropriate position. Nasogastric tube is seen entering the stomach. Heart size is within normal limits. Mild asymmetric opacity is seen in the left upper lobe, suspicious for pneumonia or aspiration. No evidence of pneumothorax or pleural effusion. Cervical spine fusion hardware again noted. IMPRESSION: Endotracheal tube and nasogastric tube in appropriate position. Mild asymmetric left upper lobe opacity, suspicious for pneumonia or aspiration. Electronically Signed   By: Myles Rosenthal M.D.   On: 04/09/2016 16:49   Dg Knee 2 Views Left  Result Date: 03/25/2016 CLINICAL DATA:  Chronic pain. EXAM: LEFT KNEE - 1-2 VIEW COMPARISON:  No recent prior. FINDINGS: No acute bony or joint abnormality identified. No evidence of fracture or dislocation. Peripheral vascular calcification. IMPRESSION: 1.  No acute bony or joint abnormality identified. 2. Peripheral vascular disease. Electronically Signed   By: Maisie Fus  Register   On: 03/25/2016 11:28   Ct Head Wo Contrast  Result Date: 04/09/2016 CLINICAL DATA:  Patient found down.  Code STEMI EXAM: CT HEAD WITHOUT CONTRAST TECHNIQUE: Contiguous axial images were obtained from the base of the skull through the vertex without intravenous contrast. COMPARISON:  CT head 03/25/2016 FINDINGS: Brain:  Ventricle size normal. Negative for hemorrhage. Negative for mass lesion Poor differentiation of gray and white matter suggesting anoxic injury to the brain. This is an interval change since the prior study. Close followup is warranted. Vascular: No hyperdense vessel or unexpected calcification. Skull: Negative Sinuses/Orbits: Negative Other: None IMPRESSION: Poor differentiation of gray matter and white matter suggestive of anoxic  injury to the brain. Negative for hemorrhage. Clinical correlation and follow-up CT recommended to confirm. These results were called by telephone at the time of interpretation on April 18, 2016 at 4:49 pm to Dr. Jene Every , who verbally acknowledged these results. Electronically Signed   By: Marlan Palau M.D.   On: Apr 18, 2016 16:50   Ct Head Wo Contrast  Result Date: 03/25/2016 CLINICAL DATA:  Status post fall with a blow to the head on a bathtub today. EXAM: CT HEAD WITHOUT CONTRAST CT CERVICAL SPINE WITHOUT CONTRAST TECHNIQUE: Multidetector CT imaging of the head and cervical spine was performed following the standard protocol without intravenous contrast. Multiplanar CT image reconstructions of the cervical spine were also generated. COMPARISON:  None. FINDINGS: CT HEAD FINDINGS Brain: Appears normal without hemorrhage, infarct, mass lesion, mass effect, midline shift or abnormal extra-axial fluid collection. No hydrocephalus or pneumocephalus. Vascular: Negative. Skull: Intact. Sinuses/Orbits: Negative. Other: None. CT CERVICAL SPINE FINDINGS Alignment: Maintained. Skull base and vertebrae: No fracture or focal abnormality. Status post C4-7 ACDF. Hardware appears intact. Soft tissues and spinal canal: No prevertebral fluid or swelling. No visible canal hematoma. Disc levels: Mild loss of disc space height is seen at C3-4 and C7-T1. Upper chest: Lung apices are clear. Other: None. IMPRESSION: No acute abnormality head or cervical spine. Status post C4-7 ACDF. Electronically  Signed   By: Drusilla Kanner M.D.   On: 03/25/2016 11:11   Ct Head Wo Contrast  Result Date: 03/04/2016 CLINICAL DATA:  56 year old diabetic hypertensive female with hyperglycemia, acute encephalopathy, lethargy and flu-like symptoms over the past 4 days. Initial encounter. EXAM: CT HEAD WITHOUT CONTRAST TECHNIQUE: Contiguous axial images were obtained from the base of the skull through the vertex without intravenous contrast. COMPARISON:  01/10/2016 brain MR.  12/19/2015 head CT. FINDINGS: Brain: No intracranial hemorrhage or CT evidence of large acute infarct. Remote tiny infarct posterior limb left internal capsule. No hydrocephalus. Expanded partially empty sella unchanged. No intracranial mass lesion noted on this unenhanced exam. Vascular: Vascular calcifications. Skull: No acute abnormality. Sinuses/Orbits: Mild exophthalmos.  No acute orbital abnormality. Visualized sinuses, mastoid air cells and middle ear cavities are clear. Other: Mild transverse ligament hypertrophy. IMPRESSION: No intracranial hemorrhage or CT evidence of large acute infarct. Remote tiny infarct posterior limb left internal capsule. Electronically Signed   By: Lacy Duverney M.D.   On: 03/04/2016 07:32   Ct Cervical Spine Wo Contrast  Result Date: 03/25/2016 CLINICAL DATA:  Status post fall with a blow to the head on a bathtub today. EXAM: CT HEAD WITHOUT CONTRAST CT CERVICAL SPINE WITHOUT CONTRAST TECHNIQUE: Multidetector CT imaging of the head and cervical spine was performed following the standard protocol without intravenous contrast. Multiplanar CT image reconstructions of the cervical spine were also generated. COMPARISON:  None. FINDINGS: CT HEAD FINDINGS Brain: Appears normal without hemorrhage, infarct, mass lesion, mass effect, midline shift or abnormal extra-axial fluid collection. No hydrocephalus or pneumocephalus. Vascular: Negative. Skull: Intact. Sinuses/Orbits: Negative. Other: None. CT CERVICAL SPINE FINDINGS  Alignment: Maintained. Skull base and vertebrae: No fracture or focal abnormality. Status post C4-7 ACDF. Hardware appears intact. Soft tissues and spinal canal: No prevertebral fluid or swelling. No visible canal hematoma. Disc levels: Mild loss of disc space height is seen at C3-4 and C7-T1. Upper chest: Lung apices are clear. Other: None. IMPRESSION: No acute abnormality head or cervical spine. Status post C4-7 ACDF. Electronically Signed   By: Drusilla Kanner M.D.   On: 03/25/2016 11:11   Ct Lumbar Spine Wo  Contrast  Result Date: 03/25/2016 CLINICAL DATA:  56 year old female with chronic lumbar back pain. Prior surgery. Subsequent encounter. EXAM: CT LUMBAR SPINE WITHOUT CONTRAST TECHNIQUE: Multidetector CT imaging of the lumbar spine was performed without intravenous contrast administration. Multiplanar CT image reconstructions were also generated. COMPARISON:  Lumbar MRI 01/10/2016. Lumbar radiographs 12/19/2015, and earlier. FINDINGS: Segmentation: Normal, which is the same numbering system used on the December 2017 MRI. Alignment: Stable. Grade 1 anterolisthesis of L3 on L4 measuring 4-5 mm. Vertebrae: No acute osseous abnormality identified. Visible sacrum and SI joints are intact. Postoperative details described below. Paraspinal and other soft tissues: Extensive Calcified aortic atherosclerosis. 2-3 mm right nephrolithiasis. Mild postoperative changes to the posterior paraspinal soft tissues at L4 and L5. Disc levels: T11-T12:  Mild facet hypertrophy. T12-L1:  Negative. L1-L2:  Stable mild facet hypertrophy. L2-L3: Mild to moderate facet and ligament flavum hypertrophy. Mild circumferential disc bulge. Stable borderline to mild spinal and left foraminal stenosis. L3-L4: Grade 1 anterolisthesis. Disc space loss. Severe bilateral facet hypertrophy. Ligament flavum hypertrophy. Circumferential disc bulge with broad-based posterior component. Bulky left far lateral component. Severe spinal stenosis,  severe left L3 foraminal stenosis, and mild to moderate right foraminal stenosis are probably stable since December. See series 4, image 67. L4-L5: Previous right hemilaminectomy as well as transpedicular and interbody fusion hardware placement. There is some interbody ossification, but scant if any interbody arthrodesis (sagittal image 43. No posterior element arthrodesis. Severe residual facet hypertrophy. No evidence of hardware loosening. Residual moderate to severe right L4 foraminal stenosis appears stable (series 5, image 34). L5-S1: Severe facet hypertrophy greater on the right where there is vacuum facet. Mild disc bulge with broad-based posterior component. No significant spinal stenosis. Mild to moderate bilateral L5 foraminal stenosis appears stable. IMPRESSION: 1.  No acute osseous abnormality identified. 2. Previous L4-L5 right laminectomy and fusion. Scant if any interbody arthrodesis. No evidence of hardware loosening. Stable residual right L4 foraminal stenosis. 3. Adjacent segment disease at L3-L4 with grade 1 spondylolisthesis and severe multifactorial spinal and left neural foraminal stenosis. No significant change since the 01/10/2016 Lumbar MRI. 4. Other lumbar levels also appear stable. Moderate L2-L3 and severe L5-S1 facet arthropathy. 5.  Calcified aortic atherosclerosis.  Right nephrolithiasis. Electronically Signed   By: Odessa FlemingH  Hall M.D.   On: 03/25/2016 11:15   Dg Chest Port 1 View  Result Date: 03/05/2016 CLINICAL DATA:  Respiratory failure. EXAM: PORTABLE CHEST 1 VIEW COMPARISON:  03/04/2016 . FINDINGS: Mediastinum and hilar structures normal. Heart size normal. Left lower lobe infiltrate cannot be completely excluded. Mild right base subsegmental atelectasis. No pneumothorax. Prior cervical spine fusion. IMPRESSION: 1.  Mild left lower lobe infiltrate cannot be completely excluded. 2. Mild right base subsegmental atelectasis . Electronically Signed   By: Maisie Fushomas  Register   On:  03/05/2016 07:20   Dg Chest Portable 1 View  Result Date: 03/04/2016 CLINICAL DATA:  Cough and fever tonight. EXAM: PORTABLE CHEST 1 VIEW COMPARISON:  01/10/2016 FINDINGS: A single AP portable view of the chest demonstrates no focal airspace consolidation or alveolar edema. The lungs are grossly clear. There is no large effusion or pneumothorax. Cardiac and mediastinal contours appear unremarkable. IMPRESSION: No active disease. Electronically Signed   By: Ellery Plunkaniel R Mitchell M.D.   On: 03/04/2016 02:14   Dg Abd Portable 1 View  Result Date: 03/14/2016 CLINICAL DATA:  Gastric tube placement EXAM: PORTABLE ABDOMEN - 1 VIEW COMPARISON:  None. FINDINGS: NG tube in the stomach with the tip near the antrum  of the stomach. Normal bowel gas pattern. Lumbar fusion with hardware at L4-5 IMPRESSION: NG tube in the gastric antrum. Electronically Signed   By: Marlan Palau M.D.   On: 04-02-16 16:46   Dg Hip Unilat W Or Wo Pelvis 2-3 Views Right  Result Date: 03/25/2016 CLINICAL DATA:  Chronic pain. EXAM: DG HIP (WITH OR WITHOUT PELVIS) 2-3V RIGHT COMPARISON:  MRI 01/10/2016 . FINDINGS: Prior lower lumbar fusion. Diffuse osteopenia degenerative change. No acute bony abnormality. Aortoiliac atherosclerotic vascular calcification. Femoral atherosclerotic vascular calcification. IMPRESSION: 1. Lower lumbar spine fusion. Hardware intact. Diffuse osteopenia degenerative change. No acute bony abnormality. 2. Aortoiliac and peripheral vascular disease . Electronically Signed   By: Maisie Fus  Register   On: 03/25/2016 11:27    Wt Readings from Last 3 Encounters:  03/25/16 164 lb (74.4 kg)  03/25/16 164 lb (74.4 kg)  03/05/16 164 lb 7.4 oz (74.6 kg)    EKG: Junctional rhythm with inferior ST elevation and ST depressions in V1 and V2 consistent with posterior/inferior infarct.  Physical Exam: Blood pressure (!) 66/47, pulse (!) 48, temperature 99.2 F (37.3 C), temperature source Rectal, resp. rate 19, SpO2 97 %.  There is no height or weight on file to calculate BMI. General: Obese woman, unresponsive. Head: Ecchymosis about right eye.  Neck: Unable to assess JVP, as patient is supine. Faint carotid pulse noted without bruit. Lungs: Coarse breath sounds anteriorly. Heart: Regular rate and rhythm without murmurs. Abdomen: Soft, non-tender, non-distended with normoactive bowel sounds. No hepatomegaly. Msk:  Unable to assess as patient is unresponsive. Extremities: Right groin intra-aortic balloon pump and temporary pacing wire in place. Left groin arterial sheath in place. Distal extremities are cool without palpable pulses. Neuro: Unresponsive without sedation (GCS 3T). Psych:  Unable to assess as patient is unresponsive    Assessment and Plan  56 year old woman status post cardiac arrest with uncertain duration before CPR started followed by length the resuscitation found to have inferior STEMI and cardiogenic shock.  Inferior STEMI Patient is status post PCI to RCA with 3 overlapping drug-eluting stents. She received heparin and tirofiban during the procedure.  Establish enteric access when possible to administer aspirin 81 mg daily and ticagrelor 180 mg x 1 followed by 90 mg twice a day.  Continue tirofiban infusion until 2 hours after ticagrelor has been administered.  Consider statin therapy once enteric access has been established.  Trend troponins until they have peaked, then stop.  Cardiogenic shock Patient still hypotensive despite multiple vasopressors (norepinephrine, epinephrine, and dopamine) and intra-aortic balloon pump support.  Continue vasopressor support and IABP.  Recommend placement of central venous catheter to track central venous pressure versus insertion of leave and Swan-Ganz catheter to help titrate medications and fluid.  Appreciate CCM assistance with management of vasopressors and acid/base status  Obtain transthoracic echocardiogram  Complete heart  block Sinus arrest at time of presentation. Patient remains pacemaker dependent.  Continue pacing. Pacemaker currently at 80 bpm with 5 mA output.  Chest x-ray upon arrival to confirm pacemaker placement and IABP placement.  Peripheral vascular disease Right groin sheath angiogram demonstrates at least 50% stenosis of the common femoral artery. Due to refractory shock, intra-aortic balloon pump was placed as a salvage measure. Pedal pulses are not palpable on exam at this time likely due to a combination of her PVD, hypertension, and multiple vasopressors. Intra-aortic balloon pump will need to be removed as soon as feasible with close monitoring of her extremities to evaluate for developing ischemia.  Disposition  Patient is critically ill. Given her cardiogenic shock and multiple support devices, she will need to be transferred to a higher level of care. Dr. Eldridge Dace has accepted her to [facility] cardiac ICU. I have spoken with both of her daughters and impressed upon her the seriousness of her mother's critical illness including the high likelihood of anoxic brain injury and poor odds for survival.  Daughters' contact info: Harlin Heys (primary contact): [phone] Velda Shell (second contact): [phone]  SignedCristal Deer Arleigh Odowd MD 2016/04/02, 7:25 PM Pager: [phone]

## 2016-03-29 NOTE — ED Notes (Signed)
Family at bedside. 

## 2016-03-29 NOTE — Progress Notes (Addendum)
eLink Physician-Brief Progress Note Patient Name: [name] DOB: 1960/07/19 MRN: [id number]   Date of Service  03/14/2016  HPI/Events of Note  56 yo female. STEMI - s/p cardiac arrest. [facility]. Intubated and ventilated. Cardiogenic shock. PCCM asked to admit. Blood glucose = 519.  eICU Interventions  Will order: 1. Mechanical Ventilation: 60%/PRVC 24/TV 450 /P 5. 2. ABG now. 3. NaHCO3 IV infusion at 125 mL/hour. 4. Norepinephrine IV infusion. 5. Dopamine IV infusion. 6. Epinephrine IV infusion.  7. Insulin IV infusion.  8. Protonix IV infusion.  9. SCD's     Intervention Category Evaluation Type: New Patient Evaluation  [name] 03/15/2016, 11:00 PM

## 2016-03-29 NOTE — Progress Notes (Signed)
ANTICOAGULATION CONSULT NOTE - Initial Consult  Pharmacy Consult for Heparin Indication: STEMI - now with IABP  Allergies  Allergen Reactions  . Lyrica [Pregabalin] Other (See Comments)    hallucination  . Penicillins Hives    Has patient had a PCN reaction causing immediate rash, facial/tongue/throat swelling, SOB or lightheadedness with hypotension: No Has patient had a PCN reaction causing severe rash involving mucus membranes or skin necrosis: No Has patient had a PCN reaction that required hospitalization No Has patient had a PCN reaction occurring within the last 10 years: Yes If all of the above answers are "NO", then may proceed with Cephalosporin use.   . Prozac [Fluoxetine Hcl] Other (See Comments)    hallucinations  . Vicodin [Hydrocodone-Acetaminophen] Rash    Patient Measurements: Height: 5\' 6"  (167.6 cm) Weight: 181 lb 7 oz (82.3 kg) IBW/kg (Calculated) : 59.3  Heparin dosing wt : 79 kg   Vital Signs: Temp: 96.3 F (35.7 C) (02/24 2230) Temp Source: Core (Comment) (02/24 2230) BP: 74/48 (02/24 2230) Pulse Rate: 48 (02/24 1637)  Labs:  Recent Labs  12-15-2016 1605  HGB 10.3*  HCT 32.7*  PLT 244  APTT 38*  LABPROT 14.6  INR 1.13  CREATININE 1.51*  TROPONINI <0.03    Estimated Creatinine Clearance: 45 mL/min (by C-G formula based on SCr of 1.51 mg/dL (H)).   Medical History: Past Medical History:  Diagnosis Date  . Anxiety   . Asthma   . Coarse tremors   . COPD (chronic obstructive pulmonary disease) (HCC)    on 3L home o2  . CVA (cerebral vascular accident) (HCC)   . 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 Hyperlipidemia   . Hypertension   . Insomnia    Assessment: 56 y.o. F presented to [place] as Code STEMI (found down by daughter - EMS began CPR with ROSC ~30 min) -[facility] lab. S/p PCI to RCA. Pt received heparin (total of [id number] units) and tirofiban in cath lab and received  IABP for cardiogenic shock. To start heparin at 0200 when tirofiban ends (per Dr. Okey DupreEnd). Noted that staged PCI to LAD may need to be considered if meaningful neurologic recovery.  Goal of Therapy:  Heparin level 0.2-0.5 units/ml Monitor platelets by anticoagulation protocol: Yes   Plan: At 0200, will start heparin at 900 units/hr. No bolus. Will f/u 6 hr heparin level Daily heparin level and CBC  Christoper Fabianaron Deondra Labrador, PharmD, BCPS Clinical pharmacist, pager [phone] 05/27/16 11:10 PM

## 2016-03-29 NOTE — ED Notes (Signed)
King removed by Dr. Cyril LoosenKinner. Samuel BoucheLucas removed and pulse check performed.

## 2016-03-29 NOTE — ED Notes (Addendum)
Patient left for cath lab. Patient remains unresponsive and on the vent.

## 2016-03-29 NOTE — ED Notes (Signed)
Cardiology at bedside.

## 2016-03-29 NOTE — Progress Notes (Signed)
eLink Physician-Brief Progress Note Patient Name: Marlene BastDeborah W Dike DOB: 1960-10-03 MRN: [id number]   Date of Service  02-Mar-2016  HPI/Events of Note  Hyperglycemia with blood sugars greater than 500 on Bicarb gtt in D5W  eICU Interventions  Changed to bicarb in saline at 125 cc/hr Continue to monitor blood sugar     Intervention Category Major Interventions: Acid-Base disturbance - evaluation and management Intermediate Interventions: Hyperglycemia - evaluation and treatment  Kamani Magnussen 02-Mar-2016, 11:39 PM

## 2016-03-29 NOTE — Progress Notes (Signed)
Increased tidal volume to 500 and increased RR to 22. Decreased fio2 to 50%.

## 2016-03-29 NOTE — ED Provider Notes (Signed)
[facility] Emergency Department Provider Note   ____________________________________________    I have reviewed the triage vital signs and the nursing notes.   HISTORY  Chief Complaint Cardiac arrest  History limited by patient distress  HPI [name] is a 56 y.o. female who presents in cardiac arrest. Per EMS patient found down by daughter, found to not be breathing. Upon EMS arrival patient in asystole, ACLS started with rapid ROSC. Lost pulses again in route, upon arrival to the emergency department again with ROSC   Past Medical History:  Diagnosis Date  . Anxiety   . Asthma   . Coarse tremors   . COPD (chronic obstructive pulmonary disease) (HCC)    on 3L home o2  . CVA (cerebral vascular accident) (HCC)   . 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 Hyperlipidemia   . Hypertension   . Insomnia     Patient Active Problem List   Diagnosis Date Noted  . Cardiac arrest (HCC) 04-19-16  . Acute encephalopathy 03/04/2016  . Stroke (HCC) 12/19/2015  . Diabetes (HCC) 12/19/2015  . Anxiety 08/31/2015  . Depression 08/31/2015  . Moderate recurrent major depression (HCC) 08/31/2015  . Grief at loss of child 07/23/2015  . Acute respiratory failure (HCC)   . COPD (chronic obstructive pulmonary disease) (HCC) 07/17/2015    Past Surgical History:  Procedure Laterality Date  . BACK SURGERY     x 2 lumbar spine  . BRONCHOSCOPY    . CERVICAL SPINE SURGERY    . CHOLECYSTECTOMY    . FOOT SURGERY      Prior to Admission medications   Medication Sig Start Date End Date Taking? Authorizing Provider  albuterol (PROVENTIL) (2.5 MG/3ML) 0.083% nebulizer solution Take 2.5 mg by nebulization every 6 (six) hours as needed for wheezing or shortness of breath.   Yes Historical Provider, MD  albuterol-ipratropium (COMBIVENT) 18-103 MCG/ACT inhaler Inhale 1 puff into the lungs every 6 (six) hours as needed for wheezing  or shortness of breath.   Yes Historical Provider, MD  ALPRAZolam Prudy Feeler) 1 MG tablet Take 0.5 tablets (0.5 mg total) by mouth 2 (two) times daily as needed for anxiety. 03/06/16  Yes Srikar Sudini, MD  amLODipine (NORVASC) 10 MG tablet Take 10 mg by mouth daily.   Yes Historical Provider, MD  aspirin EC 325 MG EC tablet Take 1 tablet (325 mg total) by mouth daily. 12/22/15  Yes Katha Hamming, MD  atorvastatin (LIPITOR) 40 MG tablet Take 40 mg by mouth daily.    Yes Historical Provider, MD  chlorthalidone (HYGROTON) 25 MG tablet Take 12.5 mg by mouth every morning.   Yes Historical Provider, MD  doxycycline (VIBRA-TABS) 100 MG tablet Take 1 tablet (100 mg total) by mouth 2 (two) times daily. 03/06/16  Yes Srikar Sudini, MD  fenofibrate 160 MG tablet Take 1 tablet (160 mg total) by mouth daily. 01/12/16  Yes Auburn Bilberry, MD  fluticasone (FLONASE) 50 MCG/ACT nasal spray Place 2 sprays into both nostrils daily as needed for rhinitis.   Yes Historical Provider, MD  Fluticasone-Salmeterol (ADVAIR) 250-50 MCG/DOSE AEPB Inhale 1 puff into the lungs 2 (two) times daily.   Yes Historical Provider, MD  gabapentin (NEURONTIN) 300 MG capsule Take 2 capsules (600 mg total) by mouth 2 (two) times daily. Patient taking differently: Take 600 mg by mouth 3 (three) times daily.  12/21/15  Yes Katha Hamming, MD  insulin aspart (NOVOLOG) 100 UNIT/ML injection Inject  0-5 Units into the skin at bedtime. Patient taking differently: Inject 21 Units into the skin 3 (three) times daily with meals.  12/21/15  Yes Katha HammingSnehalatha Konidena, MD  insulin glargine (LANTUS) 100 unit/mL SOPN Inject 0.25 mLs (25 Units total) into the skin at bedtime. Patient taking differently: Inject 38 Units into the skin 2 (two) times daily.  03/06/16  Yes Srikar Sudini, MD  lisinopril (PRINIVIL,ZESTRIL) 2.5 MG tablet Take 2.5 mg by mouth daily.   Yes Historical Provider, MD  magnesium oxide (MAG-OX) 400 (241.3 Mg) MG tablet Take 400 mg by  mouth 2 (two) times daily.    Yes Historical Provider, MD  Melatonin 3 MG TABS Take 1 tablet by mouth at bedtime.   Yes Historical Provider, MD  metoprolol tartrate (LOPRESSOR) 25 MG tablet Take 25 mg by mouth 2 (two) times daily.   Yes Historical Provider, MD  oxyCODONE-acetaminophen (PERCOCET) 7.5-325 MG tablet Take 1 tablet by mouth every 12 (twelve) hours as needed for severe pain. Every 4-6 hours Patient taking differently: Take 1 tablet by mouth every 6 (six) hours as needed for severe pain. Every 4-6 hours 01/11/16  Yes Auburn BilberryShreyang Patel, MD  tiotropium (SPIRIVA) 18 MCG inhalation capsule Place 18 mcg into inhaler and inhale daily.   Yes Historical Provider, MD  traZODone (DESYREL) 100 MG tablet Take 100 mg by mouth at bedtime as needed for sleep.    Yes Historical Provider, MD  vortioxetine HBr (TRINTELLIX) 10 MG TABS Take 1 tablet by mouth daily.   Yes Historical Provider, MD  zolpidem (AMBIEN) 10 MG tablet Take 10 mg by mouth at bedtime as needed for sleep.   Yes Historical Provider, MD     Allergies Lyrica [pregabalin]; Penicillins; Prozac [fluoxetine hcl]; and Vicodin [hydrocodone-acetaminophen]  Family History  Problem Relation Age of Onset  . CAD Mother   . Hypertension Mother   . Throat cancer Father     Social History Social History  Substance Use Topics  . Smoking status: Current Every Day Smoker    Packs/day: 0.50    Types: Cigarettes  . Smokeless tobacco: Never Used  . Alcohol use No    Level V caveat: Unable to obtain Review of Systems due to patient distress    ____________________________________________   PHYSICAL EXAM:  VITAL SIGNS: ED Triage Vitals  Enc Vitals Group     BP 03/24/2016 1600 (!) 65/40     Pulse Rate 03/08/2016 1600 (!) 48     Resp 03/23/2016 1600 15     Temp 03/26/2016 1615 (!) 95.8 F (35.4 C)     Temp Source 03/08/2016 1637 Rectal     SpO2 03/07/2016 1600 100 %     Weight --      Height --      Head Circumference --      Peak Flow --       Pain Score --      Pain Loc --      Pain Edu? --      Excl. in GC? --     Constitutional: Unresponsive, critically ill  Head: Atraumatic. Nose: No rhinorrhea Mouth/Throat: Food particles noted  Cardiovascular: Positive pulse, concerning ST elevation on monitor Respiratory: Agonal breathing Gastrointestinal: Soft with distention Genitourinary: No bleeding noted Musculoskeletal: No lower extremity edema Neurologic:  Unable to examine at this time Skin:  Skin is intact    ____________________________________________   LABS (all labs ordered are listed, but only abnormal results are displayed)  Labs Reviewed  CBC -  Abnormal; Notable for the following:       Result Value   WBC 21.8 (*)    RBC 3.46 (*)    Hemoglobin 10.3 (*)    HCT 32.7 (*)    MCHC 31.5 (*)    RDW 15.3 (*)    All other components within normal limits  DIFFERENTIAL - Abnormal; Notable for the following:    Neutro Abs 11.5 (*)    Lymphs Abs 9.1 (*)    Basophils Absolute 0.2 (*)    All other components within normal limits  APTT - Abnormal; Notable for the following:    aPTT 38 (*)    All other components within normal limits  COMPREHENSIVE METABOLIC PANEL - Abnormal; Notable for the following:    CO2 21 (*)    Glucose, Bld 365 (*)    Creatinine, Ser 1.51 (*)    Calcium 8.1 (*)    Total Protein 5.5 (*)    Albumin 2.4 (*)    AST 76 (*)    Alkaline Phosphatase 127 (*)    GFR calc non Af Amer 38 (*)    GFR calc Af Amer 44 (*)    All other components within normal limits  LIPID PANEL - Abnormal; Notable for the following:    Triglycerides 234 (*)    HDL 28 (*)    VLDL 47 (*)    All other components within normal limits  URINALYSIS, COMPLETE (UACMP) WITH MICROSCOPIC - Abnormal; Notable for the following:    Color, Urine YELLOW (*)    APPearance CLOUDY (*)    Glucose, UA 150 (*)    Hgb urine dipstick SMALL (*)    Protein, ur >=300 (*)    Bacteria, UA MANY (*)    All other components within normal  limits  GLUCOSE, CAPILLARY - Abnormal; Notable for the following:    Glucose-Capillary 354 (*)    All other components within normal limits  CULTURE, BLOOD (ROUTINE X 2)  CULTURE, BLOOD (ROUTINE X 2)  PROTIME-INR  TROPONIN I  LACTIC ACID, PLASMA  LACTIC ACID, PLASMA  HIV ANTIBODY (ROUTINE TESTING)  TROPONIN I  TROPONIN I  TROPONIN I  BASIC METABOLIC PANEL  BASIC METABOLIC PANEL  PROTIME-INR  APTT  BLOOD GAS, ARTERIAL  BLOOD GAS, ARTERIAL   ____________________________________________  EKG  ED ECG REPORT I, Jene Every, the attending physician, personally viewed and interpreted this ECG.  Date: Mar 31, 2016  Rate: 77 Rhythm: Junctional rhythm QRS Axis: normal Intervals: normal ST/T Wave abnormalities: Elevations in 2,3 aVF Conduction Disturbances: Bundle branch block Narrative Interpretation: Acute STEMI Code STEMI paged ____________________________________________  RADIOLOGY  ET tube in appropriate position ____________________________________________   PROCEDURES  Procedure(s) performed: INTUBATION Performed by: Jene Every  Required items: required blood products, implants, devices, and special equipment available Patient identity confirmed: provided demographic data and hospital-assigned identification number Time out: Immediately prior to procedure a "time out" was called to verify the correct patient, procedure, equipment, support staff and site/side marked as required.  Indications: AMS, cardiac arrest  Intubation method: Glidescope Laryngoscopy   Preoxygenation: BVM  Tube Size: 7.5 cuffed  Post-procedure assessment: chest rise and ETCO2 monitor Breath sounds: equal and absent over the epigastrium Tube secured with: ETT holder Chest x-ray interpreted by radiologist and me.  Chest x-ray findings: endotracheal tube in appropriate position    Critical Care performed: yes  CRITICAL CARE Performed by: Jene Every   Total critical  care time: 50 minutes  Critical care time was exclusive of separately  billable procedures and treating other patients.  Critical care was necessary to treat or prevent imminent or life-threatening deterioration.  Critical care was time spent personally by me on the following activities: development of treatment plan with patient and/or surrogate as well as nursing, discussions with consultants, evaluation of patient's response to treatment, examination of patient, obtaining history from patient or surrogate, ordering and performing treatments and interventions, ordering and review of laboratory studies, ordering and review of radiographic studies, pulse oximetry and re-evaluation of patient's condition. ____________________________________________   INITIAL IMPRESSION / ASSESSMENT AND PLAN / ED COURSE  Pertinent labs & imaging results that were available during my care of the patient were reviewed by me and considered in my medical decision making (see chart for details).  Patient presented with ACLS in progress. On first pulse check in ED, found to have ROSC. Concerning ST elevation on monitor, stat EKG demonstrated inferior ST elevations. Code STEMI called immediately. Also paged intensivist to discuss cooling the patient. Both Dr. Okey Dupre and Dr. Belia Heman requested CT head prior to Cath lab and/or cooling  Dopamine started for hypotension. Prior to CT added levophed as well. No heparin given as possibility of head bleed.   CT head concerning for anoxic brain injury but no evidence of bleed.  Patient successfully to cath lab. Family aware of poor prognosis    ____________________________________________   FINAL CLINICAL IMPRESSION(S) / ED DIAGNOSES  Final diagnoses:  Cardiac arrest (HCC)      NEW MEDICATIONS STARTED DURING THIS VISIT:  New Prescriptions   No medications on file     Note:  This document was prepared using Dragon voice recognition software and may include  unintentional dictation errors.    Jene Every, MD 03/15/2016 [phone]

## 2016-03-30 ENCOUNTER — Other Ambulatory Visit (HOSPITAL_COMMUNITY): Payer: Medicaid Other

## 2016-03-30 ENCOUNTER — Encounter (HOSPITAL_COMMUNITY): Payer: Self-pay

## 2016-03-30 DIAGNOSIS — R570 Cardiogenic shock: Secondary | ICD-10-CM

## 2016-03-30 DIAGNOSIS — I469 Cardiac arrest, cause unspecified: Secondary | ICD-10-CM

## 2016-03-30 LAB — CBC
HCT: 32.3 % — ABNORMAL LOW (ref 36.0–46.0)
HEMOGLOBIN: 9.8 g/dL — AB (ref 12.0–15.0)
MCH: 29.3 pg (ref 26.0–34.0)
MCHC: 30.3 g/dL (ref 30.0–36.0)
MCV: 96.7 fL (ref 78.0–100.0)
PLATELETS: 157 10*3/uL (ref 150–400)
RBC: 3.34 MIL/uL — AB (ref 3.87–5.11)
RDW: 14.3 % (ref 11.5–15.5)
WBC: 13.6 10*3/uL — AB (ref 4.0–10.5)

## 2016-03-30 LAB — BLOOD GAS, ARTERIAL
ACID-BASE DEFICIT: 16.6 mmol/L — AB (ref 0.0–2.0)
Bicarbonate: 11.5 mmol/L — ABNORMAL LOW (ref 20.0–28.0)
DRAWN BY: 252031
FIO2: 60
LHR: 24 {breaths}/min
O2 SAT: 97.6 %
PCO2 ART: 38.5 mmHg (ref 32.0–48.0)
PEEP: 5 cmH2O
PH ART: 7.093 — AB (ref 7.350–7.450)
Patient temperature: 96.1
VT: 450 mL
pO2, Arterial: 139 mmHg — ABNORMAL HIGH (ref 83.0–108.0)

## 2016-03-30 LAB — COMPREHENSIVE METABOLIC PANEL
ALBUMIN: 2 g/dL — AB (ref 3.5–5.0)
ALT: 218 U/L — ABNORMAL HIGH (ref 14–54)
AST: 647 U/L — AB (ref 15–41)
Alkaline Phosphatase: 186 U/L — ABNORMAL HIGH (ref 38–126)
Anion gap: 27 — ABNORMAL HIGH (ref 5–15)
BUN: 19 mg/dL (ref 6–20)
CHLORIDE: 96 mmol/L — AB (ref 101–111)
CO2: 20 mmol/L — ABNORMAL LOW (ref 22–32)
Calcium: 6.7 mg/dL — ABNORMAL LOW (ref 8.9–10.3)
Creatinine, Ser: 2.09 mg/dL — ABNORMAL HIGH (ref 0.44–1.00)
GFR calc Af Amer: 29 mL/min — ABNORMAL LOW (ref >60)
GFR calc non Af Amer: 25 mL/min — ABNORMAL LOW (ref >60)
GLUCOSE: 577 mg/dL — AB (ref 65–99)
Potassium: 3.8 mmol/L (ref 3.5–5.1)
Sodium: 143 mmol/L (ref 135–145)
Total Bilirubin: 1.3 mg/dL — ABNORMAL HIGH (ref 0.3–1.2)
Total Protein: 4.8 g/dL — ABNORMAL LOW (ref 6.5–8.1)

## 2016-03-30 LAB — CBC WITH DIFFERENTIAL/PLATELET
BASOS ABS: 0 10*3/uL (ref 0.0–0.1)
Basophils Relative: 0 %
EOS ABS: 0 10*3/uL (ref 0.0–0.7)
Eosinophils Relative: 0 %
HCT: 34.4 % — ABNORMAL LOW (ref 36.0–46.0)
HEMOGLOBIN: 10.4 g/dL — AB (ref 12.0–15.0)
LYMPHS ABS: 2.7 10*3/uL (ref 0.7–4.0)
Lymphocytes Relative: 19 %
MCH: 28.8 pg (ref 26.0–34.0)
MCHC: 30.2 g/dL (ref 30.0–36.0)
MCV: 95.3 fL (ref 78.0–100.0)
Monocytes Absolute: 0.7 10*3/uL (ref 0.1–1.0)
Monocytes Relative: 5 %
Neutro Abs: 11 10*3/uL — ABNORMAL HIGH (ref 1.7–7.7)
Neutrophils Relative %: 76 %
PLATELETS: 172 10*3/uL (ref 150–400)
RBC: 3.61 MIL/uL — ABNORMAL LOW (ref 3.87–5.11)
RDW: 14.2 % (ref 11.5–15.5)
WBC: 14.4 10*3/uL — ABNORMAL HIGH (ref 4.0–10.5)

## 2016-03-30 LAB — GLUCOSE, CAPILLARY
GLUCOSE-CAPILLARY: 300 mg/dL — AB (ref 65–99)
GLUCOSE-CAPILLARY: 303 mg/dL — AB (ref 65–99)
GLUCOSE-CAPILLARY: 306 mg/dL — AB (ref 65–99)
GLUCOSE-CAPILLARY: 321 mg/dL — AB (ref 65–99)
GLUCOSE-CAPILLARY: 353 mg/dL — AB (ref 65–99)
GLUCOSE-CAPILLARY: 360 mg/dL — AB (ref 65–99)
GLUCOSE-CAPILLARY: 399 mg/dL — AB (ref 65–99)
GLUCOSE-CAPILLARY: 471 mg/dL — AB (ref 65–99)
GLUCOSE-CAPILLARY: 536 mg/dL — AB (ref 65–99)
GLUCOSE-CAPILLARY: 540 mg/dL — AB (ref 65–99)
Glucose-Capillary: 319 mg/dL — ABNORMAL HIGH (ref 65–99)
Glucose-Capillary: 398 mg/dL — ABNORMAL HIGH (ref 65–99)
Glucose-Capillary: 418 mg/dL — ABNORMAL HIGH (ref 65–99)
Glucose-Capillary: 426 mg/dL — ABNORMAL HIGH (ref 65–99)
Glucose-Capillary: 449 mg/dL — ABNORMAL HIGH (ref 65–99)
Glucose-Capillary: 486 mg/dL — ABNORMAL HIGH (ref 65–99)
Glucose-Capillary: 537 mg/dL (ref 65–99)

## 2016-03-30 LAB — POCT I-STAT 3, ART BLOOD GAS (G3+)
ACID-BASE DEFICIT: 18 mmol/L — AB (ref 0.0–2.0)
Acid-base deficit: 8 mmol/L — ABNORMAL HIGH (ref 0.0–2.0)
BICARBONATE: 11 mmol/L — AB (ref 20.0–28.0)
Bicarbonate: 19 mmol/L — ABNORMAL LOW (ref 20.0–28.0)
O2 Saturation: 98 %
O2 Saturation: 98 %
PCO2 ART: 39.7 mmHg (ref 32.0–48.0)
PCO2 ART: 45.6 mmHg (ref 32.0–48.0)
PH ART: 7.05 — AB (ref 7.350–7.450)
PH ART: 7.227 — AB (ref 7.350–7.450)
PO2 ART: 117 mmHg — AB (ref 83.0–108.0)
PO2 ART: 145 mmHg — AB (ref 83.0–108.0)
Patient temperature: 98.6
TCO2: 12 mmol/L (ref 0–100)
TCO2: 20 mmol/L (ref 0–100)

## 2016-03-30 LAB — PROTIME-INR
INR: 1.38
PROTHROMBIN TIME: 17 s — AB (ref 11.4–15.2)

## 2016-03-30 LAB — HEPARIN LEVEL (UNFRACTIONATED)
Heparin Unfractionated: 0.71 IU/mL — ABNORMAL HIGH (ref 0.30–0.70)
Heparin Unfractionated: 0.57 IU/mL (ref 0.30–0.70)

## 2016-03-30 LAB — MRSA PCR SCREENING: MRSA by PCR: NEGATIVE

## 2016-03-30 LAB — APTT: aPTT: 188 seconds (ref 24–36)

## 2016-03-30 LAB — LACTIC ACID, PLASMA
Lactic Acid, Venous: 14.4 mmol/L (ref 0.5–1.9)
Lactic Acid, Venous: 18.6 mmol/L (ref 0.5–1.9)

## 2016-03-30 LAB — MAGNESIUM: Magnesium: 2.5 mg/dL — ABNORMAL HIGH (ref 1.7–2.4)

## 2016-03-30 MED ORDER — LORAZEPAM 2 MG/ML IJ SOLN
0.5000 mg | INTRAMUSCULAR | Status: DC | PRN
  Administered 2016-03-30: 1 mg via INTRAVENOUS
  Filled 2016-03-30: qty 1

## 2016-03-30 MED ORDER — LORAZEPAM 2 MG/ML IJ SOLN: 1.0000 mg | Freq: Once | INTRAMUSCULAR | Status: AC

## 2016-03-30 MED ORDER — FENTANYL CITRATE (PF) 100 MCG/2ML IJ SOLN
25.0000 ug | INTRAMUSCULAR | Status: DC | PRN
  Administered 2016-03-30: 100 ug via INTRAVENOUS
  Filled 2016-03-30: qty 2

## 2016-03-30 MED ORDER — SODIUM CHLORIDE 0.9 % IV SOLN: 250.0000 mL | INTRAVENOUS | Status: DC | PRN

## 2016-03-30 MED ORDER — SODIUM BICARBONATE 8.4 % IV SOLN
100.0000 meq | Freq: Once | INTRAVENOUS | Status: AC
  Administered 2016-03-30: 100 meq via INTRAVENOUS

## 2016-03-30 MED ORDER — FENTANYL CITRATE (PF) 100 MCG/2ML IJ SOLN: 100.0000 ug | Freq: Once | INTRAMUSCULAR | Status: AC

## 2016-03-30 MED FILL — Medication: Qty: 1 | Status: AC

## 2016-03-31 ENCOUNTER — Encounter: Payer: Self-pay | Admitting: Internal Medicine

## 2016-03-31 LAB — POCT ACTIVATED CLOTTING TIME
ACTIVATED CLOTTING TIME: 263 s
Activated Clotting Time: 219 seconds
Activated Clotting Time: 230 seconds
Activated Clotting Time: 246 seconds

## 2016-03-31 LAB — POCT I-STAT 3, ART BLOOD GAS (G3+)
ACID-BASE DEFICIT: 1 mmol/L (ref 0.0–2.0)
BICARBONATE: 26.6 mmol/L (ref 20.0–28.0)
O2 SAT: 100 %
TCO2: 28 mmol/L (ref 0–100)
pCO2 arterial: 56.5 mmHg — ABNORMAL HIGH (ref 32.0–48.0)
pH, Arterial: 7.28 — ABNORMAL LOW (ref 7.350–7.450)
pO2, Arterial: 282 mmHg — ABNORMAL HIGH (ref 83.0–108.0)

## 2016-04-03 LAB — CULTURE, BLOOD (ROUTINE X 2): CULTURE: NO GROWTH

## 2016-04-03 NOTE — Progress Notes (Signed)
Patient expired at 16:04, no breath sounds or heart tones auscultated by Prince RomeJessica Manessa Buley, RN & Smith RobertErin Moore, RN. MD notified.  Emotional support offered to family.

## 2016-04-03 NOTE — Progress Notes (Signed)
Daughters updated on patient's condition, as per night RN, and emotional support and Chaplain offered.  Daughters declined chaplain support. Will continue to monitor and offer support. [place], Mitzi HansenJessica Marie

## 2016-04-03 NOTE — Procedures (Signed)
Extubation Procedure Note  Patient Details:   Name: [name] DOB: Dec 13, 1960 MRN: [id number]   Pt extubated per MD order.  Family at bedside.     [name], Regene Mccarthy 10-09-2016, 3:42 PM

## 2016-04-03 NOTE — Progress Notes (Signed)
   Patient with severe anoxic brain injury after VF arrest with prolonged down time.   Now comfort care per CCM.    Aissa Lisowski,MD 2:03 PM

## 2016-04-03 NOTE — Progress Notes (Signed)
Patient's daughter told RN she is ready to withdraw support. Canary BrimBrandi Ollis, NP notified. [place], Mitzi HansenJessica Fields

## 2016-04-03 NOTE — Progress Notes (Signed)
PULMONARY / CRITICAL CARE MEDICINE   Name: CHRISSIE DACQUISTO MRN: [id number] DOB: 11-Jan-1961    ADMISSION DATE:  23-Apr-2016 CONSULTATION DATE:    REFERRING MD:  [facility]  CHIEF COMPLAINT:  Cardiac arrest  BRIEF SUMMARY:  56 y/o F admitted after cardiac arrest > initial rhythm asystole, ROSC after ~ 30 minutes.  EKG with ST elevations, hypotensive on vasopressors.  Cath > RCA occlusion with 3 overlapping drug-eluting stents placed.  Returned on IABP with transvenous pacer.  Tx to [facility] for further care.  CT of the head with early edema consistent with cerebral edema/ anoxic injury.   PMH significant for COPD, prior stroke, DMII, dyslipidemia and hypertension  SUBJECTIVE:   RN reports increasing vasopressor support, worsening acidosis.     VITAL SIGNS: BP (!) 71/19   Pulse 80   Temp (!) 95.9 F (35.5 C)   Resp (!) 35   Ht 5\' 6"  (1.676 m)   Wt 182 lb 15.7 oz (83 kg)   SpO2 100%   BMI 29.53 kg/m   HEMODYNAMICS:    VENTILATOR SETTINGS: Vent Mode: PRVC FiO2 (%):  [60 %-100 %] 60 % Set Rate:  [16 bmp-35 bmp] 35 bmp Vt Set:  [450 mL] 450 mL PEEP:  [5 cmH20] 5 cmH20 Plateau Pressure:  [17 cmH20-22 cmH20] 20 cmH20  INTAKE / OUTPUT: I/O last 3 completed shifts: In: 2223.5 [I.V.:2223.5] Out: 95 [Urine:95]  PHYSICAL EXAMINATION: General:  Critically ill appearing female on vent  HEENT: MM pink/moist, ETT  Neuro:  No response to verbal / painful stimuli, not overbreathing vent  CV: s1s2 rrr, no m/r/g PULM: even/non-labored, lungs bilaterally clear anterior WU:JWJX, non-tender, bsx4 active  Extremities: cool/dry, trace edema, LE's mottled Skin: no rashes or lesions   LABS:  BMET  Recent Labs Lab 03/26/16 0021 Apr 23, 2016 1605 04-23-2016 2310  NA 139 135 143  K 4.1 4.4 3.8  CL 105 102 96*  CO2 28 21* 20*  BUN 23* 16 19  CREATININE 1.17* 1.51* 2.09*  GLUCOSE 212* 365* 577*    Electrolytes  Recent Labs Lab 03/26/16 0021 04-23-16 1605 04/23/16 2310  CALCIUM 8.4*  8.1* 6.7*  MG  --   --  2.5*    CBC  Recent Labs Lab 23-Apr-2016 1605 Apr 23, 2016 2310 03/29/2016 0608  WBC 21.8* 14.4* 13.6*  HGB 10.3* 10.4* 9.8*  HCT 32.7* 34.4* 32.3*  PLT 244 172 157    Coag's  Recent Labs Lab 04-23-16 1605 04-23-2016 2310  APTT 38* 188*  INR 1.13 1.38    Sepsis Markers  Recent Labs Lab 04-23-16 1611 04/23/2016 2256 03/11/2016 0156  LATICACIDVEN 7.1* 14.4* 18.6*    ABG  Recent Labs Lab 03/26/2016 0000 03/29/2016 0340 03/18/2016 0840  PHART 7.227* 7.093* 7.050*  PCO2ART 45.6 38.5 39.7  PO2ART 117.0* 139* 145.0*    Liver Enzymes  Recent Labs Lab 03/26/16 0021 2016-04-23 1605 Apr 23, 2016 2310  AST 16 76* 647*  ALT 10* 42 218*  ALKPHOS 96 127* 186*  BILITOT 0.3 0.5 1.3*  ALBUMIN 3.0* 2.4* 2.0*    Cardiac Enzymes  Recent Labs Lab 03/25/16 1029 2016/04/23 1605  TROPONINI <0.03 <0.03    Glucose  Recent Labs Lab 03/20/2016 0557 03/26/2016 0650 03/10/2016 0754 03/31/2016 0825 03/15/2016 0916 03/25/2016 1018  GLUCAP 426* 418* 398* 399* 353* 360*    Imaging Dg Chest 1 View  Result Date: 23-Apr-2016 CLINICAL DATA:  Cardiac arrest. Intubation. Myocardial infarct. COPD. EXAM: CHEST 1 VIEW COMPARISON:  03/05/2016 FINDINGS: Endotracheal tube is seen in appropriate  position. Nasogastric tube is seen entering the stomach. Heart size is within normal limits. Mild asymmetric opacity is seen in the left upper lobe, suspicious for pneumonia or aspiration. No evidence of pneumothorax or pleural effusion. Cervical spine fusion hardware again noted. IMPRESSION: Endotracheal tube and nasogastric tube in appropriate position. Mild asymmetric left upper lobe opacity, suspicious for pneumonia or aspiration. Electronically Signed   By: Myles Rosenthal M.D.   On: 03/25/2016 16:49   Ct Head Wo Contrast  Result Date: 03/31/2016 CLINICAL DATA:  Patient found down.  Code STEMI EXAM: CT HEAD WITHOUT CONTRAST TECHNIQUE: Contiguous axial images were obtained from the base of the skull  through the vertex without intravenous contrast. COMPARISON:  CT head 03/25/2016 FINDINGS: Brain: Ventricle size normal. Negative for hemorrhage. Negative for mass lesion Poor differentiation of gray and white matter suggesting anoxic injury to the brain. This is an interval change since the prior study. Close followup is warranted. Vascular: No hyperdense vessel or unexpected calcification. Skull: Negative Sinuses/Orbits: Negative Other: None IMPRESSION: Poor differentiation of gray matter and white matter suggestive of anoxic injury to the brain. Negative for hemorrhage. Clinical correlation and follow-up CT recommended to confirm. These results were called by telephone at the time of interpretation on 03/14/2016 at 4:49 pm to Dr. Jene Every , who verbally acknowledged these results. Electronically Signed   By: Marlan Palau M.D.   On: 03/14/2016 16:50   Dg Chest Port 1 View  Result Date: April 23, 2016 CLINICAL DATA:  Cardiogenic shock. EXAM: PORTABLE CHEST 1 VIEW COMPARISON:  One-view chest x-ray 03/25/2016. FINDINGS: Endotracheal tube is stable 4 cm above the carina. An NG tube courses off the inferior border the film. Aortic balloon pump is in place. Defibrillator pads are in place. The left CP angle is not included on the film. Asymmetric left-sided edema and effusion are noted. Left basilar airspace disease likely reflects atelectasis. Persistent left upper lobe airspace disease is noted. IMPRESSION: 1. Increasing edema and left pleural effusion suggesting congestive heart failure. 2. Interval placement of aortic balloon pump. 3. Endotracheal tube positioning is stable. 4. Left upper lobe airspace disease remains worrisome for infection or aspiration. Electronically Signed   By: Marin Roberts M.D.   On: April 23, 2016 07:35   Dg Abd Portable 1 View  Result Date: 03/29/2016 CLINICAL DATA:  Gastric tube placement EXAM: PORTABLE ABDOMEN - 1 VIEW COMPARISON:  None. FINDINGS: NG tube in the stomach with  the tip near the antrum of the stomach. Normal bowel gas pattern. Lumbar fusion with hardware at L4-5 IMPRESSION: NG tube in the gastric antrum. Electronically Signed   By: Marlan Palau M.D.   On: 03/29/2016 16:46   STUDIES:  As above  CULTURES: None  ANTIBIOTICS: None  SIGNIFICANT EVENTS: 2/25 > Admit to [facility] from [facility] after Cath lab - 3 stents to RCA, IABP, transvenous pacer  LINES/TUBES: OETT 2/25 >> IABP 2/25 >>  R tibial IO >> out Arterial line 2/25 >>   DISCUSSION: Ms. Kearse is a 21F with cardiac arrest secondary to acute STEMI, s/p attempt at revascularization with 3 DES to RCA and salvage measures with IABP and pacer for complete heart block. She is in profound cardiogenic shock requiring multiple pressors. Unknown downtime with initial rhythm asystole. Her CT is suggestive of anoxic injury and she clinically appears to be neurologically devastated. Discussions regarding goals of care with family  > transition to full comfort care once all have visited.    ASSESSMENT / PLAN:  PULMONARY A: Need  for intubation 2/2 cardiac arrest and inability to protect airway P:   Continue vent support for now Will plan to proceed to withdrawal of care once all family visited   CARDIOVASCULAR A:  Cardiogenic shock; IABP in place 1:1 STEMI s/p DES x 3 to RCA Cardiac arrest - initial rhythm asystole Complete heart block - AV paced, DDD, 5 mA @ 80bpm P:  Cardiology following, appreciate assistance Continue supportive measures for now > IABG, pressors, pacer Comfort measures in addition to support for now  RENAL A:   Acute kidney injury Severe metabolic (lactic) acidosis P:   Monitor BMP  Continue bicarb gtt  GASTROINTESTINAL A:   No acute issues P:   NPO  HEMATOLOGIC A:   No acute issues Chronic anemia Leukocytosis - reactive P:  Trend CBC  Heparin gtt per pharmacy   INFECTIOUS A:   No acute issues P:    ENDOCRINE A:   DMII   P:   CBGs/insulin as  needed  NEUROLOGIC A:   Concern for global anoxic injury  P:   RASS goal: 0 Monitor neuro exam  Supportive care   FAMILY  - Updates:  Daughters updated at bedside, patients brother present as well.  They are in agreement to proceed to comfort measures once all family arrive.  They also understand that she is clinically worsening despite support and she may pass on the ventilator/IABP.  Full DNR.  Comfort medications added.  Will proceed with withdrawal of care once family arrives.    - Inter-disciplinary family meet or Palliative Care meeting due by:  day 7  CC Time:  30 minutes  Canary BrimBrandi Ollis, NP-C [facility] Pgr: [phone] or if no answer [phone] 24-Dec-2016, 11:57 AM

## 2016-04-03 NOTE — Progress Notes (Signed)
eLink Physician-Brief Progress Note Patient Name: Teresa BastDeborah W Fields DOB: 04/08/1960 MRN: [id number]   Date of Service  2016/03/31  HPI/Events of Note  Worsening metabolic acidosis in patient s/p code with prolonged resuscitation.  Now with pH 7.09/38/139/11.  Remains hypotensive despite max pressors and IABP augmentation.  Now DNR.  Suspect patient will not last next 12 hours  eICU Interventions  Plan: Vent adjustments for acidosis 2 amps of bicarb IVP     Intervention Category Major Interventions: Acid-Base disturbance - evaluation and management  Theodoro Koval 2016/03/31, 4:00 AM

## 2016-04-03 NOTE — Progress Notes (Addendum)
ANTICOAGULATION CONSULT NOTE - Follow-up Consult  Pharmacy Consult for Heparin Indication: STEMI - now with IABP  Allergies  Allergen Reactions  . Lyrica [Pregabalin] Other (See Comments)    hallucination  . Penicillins Hives    Has patient had a PCN reaction causing immediate rash, facial/tongue/throat swelling, SOB or lightheadedness with hypotension: No Has patient had a PCN reaction causing severe rash involving mucus membranes or skin necrosis: No Has patient had a PCN reaction that required hospitalization No Has patient had a PCN reaction occurring within the last 10 years: Yes If all of the above answers are "NO", then may proceed with Cephalosporin use.   . Prozac [Fluoxetine Hcl] Other (See Comments)    hallucinations  . Vicodin [Hydrocodone-Acetaminophen] Rash    Patient Measurements: Height: 5\' 6"  (167.6 cm) Weight: 182 lb 15.7 oz (83 kg) IBW/kg (Calculated) : 59.3  Heparin dosing wt : 79 kg   Vital Signs: Temp: 96.8 F (36 C) (02/25 0600) Temp Source: Core (Comment) (02/25 0400) BP: 51/40 (02/25 0600) Pulse Rate: 80 (02/25 0400)  Labs:  Recent Labs  03/25/2016 1605 03/28/2016 2310 December 01, 2016 0552 December 01, 2016 0608  HGB 10.3* 10.4*  --  9.8*  HCT 32.7* 34.4*  --  32.3*  PLT 244 172  --  157  APTT 38* 188*  --   --   LABPROT 14.6 17.0*  --   --   INR 1.13 1.38  --   --   HEPARINUNFRC  --   --  0.71*  --   CREATININE 1.51* 2.09*  --   --   TROPONINI <0.03  --   --   --     Estimated Creatinine Clearance: 32.6 mL/min (by C-G formula based on SCr of 2.09 mg/dL (H)).  Assessment: 56 y.o. F presented to [facility] as Code STEMI (found down by daughter - EMS began CPR with ROSC ~30 min) -[facility]. S/p PCI to RCA. Pt received heparin (total of [id number] units) and tirofiban in cath lab and received IABP for cardiogenic shock. Heparin started 2/25 ~0200 when tirofiban ended (per Dr. Okey DupreEnd). Heparin level supratherapeutic (0.71) on gtt at 900  units/hr. Hgb, plt down a bit. No bleeding noted.  Noted CT suggestive of anoxic injury - family discussions ongoing.  Goal of Therapy:  Heparin level 0.2-0.5 units/ml Monitor platelets by anticoagulation protocol: Yes   Plan: Decrease heparin to 700 units/hr Will f/u 6 hr heparin level Daily heparin level and CBC  Christoper Fabianaron Addisynn Vassell, PharmD, BCPS Clinical pharmacist, pager [phone] 2016-12-24 6:33 AM

## 2016-04-03 DEATH — deceased

## 2016-04-04 ENCOUNTER — Telehealth: Payer: Self-pay

## 2016-04-04 NOTE — Telephone Encounter (Signed)
On 04/04/16 I received a death certificate from [employer] (faxed). The death certificate is for cremation. The patient is a patient of Doctor Maneem. [facility] for signature.  On 04/08/16 I received the death certificate back from Doctor Maneem. I got the death certificate ready and called the funeral home to let them know I faxed a copy to the funeral home and per their request I mailed the d/c to vital records.

## 2016-04-07 ENCOUNTER — Telehealth: Payer: Self-pay

## 2016-04-07 NOTE — Telephone Encounter (Signed)
On 04/07/16 I received a death certificate from [employer] (original). The death certificate is for cremation. The patient is a patient of Doctor Maneem. [facility] this pm for signature.   On 04/08/16 I received the death certificate back from Doctor Maneem. I got the death certificate ready and faxed the a copy of the  death certificate to the funeral home and mailed the original to vital records per the funeral home request.

## 2016-05-04 NOTE — Discharge Summary (Signed)
Physician Discharge Summary  Patient ID: [name] MRN: [id number] DOB/AGE: 07/12/1960 56 y.o.  Admit date: 03/25/2016 Discharge date: 04/25/2016  Admission Diagnoses: Cardiac arrest ST elevation MI  Discharge Diagnoses:  Active Problems:   Cardiac arrest (HCC) ST elevation MI Cardiogenic shock Complete heart block Severe Anoxic brain injury Acute kidney injury  Discharged Condition: Deceased  Hospital Course:  Ms. [name] is a 57F with cardiac arrest secondary to acute STEMI, s/p attempt at revascularization with 3 DES to RCA and salvage measures with IABP and pacer for complete heart block. She was in profound cardiogenic shock requiring multiple pressors. Unknown downtime with initial rhythm asystole. Her CT is suggestive of anoxic injury and she clinically appears to be neurologically devastated. Discussions regarding goals of care with family were held and as per family wished she was  transition to full comfort care.    Consults: cardiology  Significant Diagnostic Studies:  radiology: CT scan: head 03/06/2016 Poor differentiation of gray matter and white matter suggestive of anoxic injury to the brain. Negative for hemorrhage.   Disposition: 20-Expired   Allergies as of September 27, 2016      Reactions   Lyrica [pregabalin] Other (See Comments)   hallucination   Penicillins Hives   Has patient had a PCN reaction causing immediate rash, facial/tongue/throat swelling, SOB or lightheadedness with hypotension: No Has patient had a PCN reaction causing severe rash involving mucus membranes or skin necrosis: No Has patient had a PCN reaction that required hospitalization No Has patient had a PCN reaction occurring within the last 10 years: Yes If all of the above answers are "NO", then may proceed with Cephalosporin use.   Prozac [fluoxetine Hcl] Other (See Comments)   hallucinations   Vicodin [hydrocodone-acetaminophen] Rash      Medication List    ASK your doctor about  these medications   albuterol (2.5 MG/3ML) 0.083% nebulizer solution Commonly known as:  PROVENTIL Take 2.5 mg by nebulization every 6 (six) hours as needed for wheezing or shortness of breath.   albuterol-ipratropium 18-103 MCG/ACT inhaler Commonly known as:  COMBIVENT Inhale 1 puff into the lungs every 6 (six) hours as needed for wheezing or shortness of breath.   ALPRAZolam 1 MG tablet Commonly known as:  XANAX Take 0.5 tablets (0.5 mg total) by mouth 2 (two) times daily as needed for anxiety.   amLODipine 10 MG tablet Commonly known as:  NORVASC Take 10 mg by mouth daily.   aspirin 325 MG EC tablet Take 1 tablet (325 mg total) by mouth daily.   atorvastatin 40 MG tablet Commonly known as:  LIPITOR Take 40 mg by mouth daily.   chlorthalidone 25 MG tablet Commonly known as:  HYGROTON Take 12.5 mg by mouth every morning.   doxycycline 100 MG tablet Commonly known as:  VIBRA-TABS Take 1 tablet (100 mg total) by mouth 2 (two) times daily.   fenofibrate 160 MG tablet Take 1 tablet (160 mg total) by mouth daily.   fluticasone 50 MCG/ACT nasal spray Commonly known as:  FLONASE Place 2 sprays into both nostrils daily as needed for rhinitis.   Fluticasone-Salmeterol 250-50 MCG/DOSE Aepb Commonly known as:  ADVAIR Inhale 1 puff into the lungs 2 (two) times daily.   gabapentin 300 MG capsule Commonly known as:  NEURONTIN Take 2 capsules (600 mg total) by mouth 2 (two) times daily.   insulin aspart 100 UNIT/ML injection Commonly known as:  novoLOG Inject 0-5 Units into the skin at bedtime.   insulin glargine 100  unit/mL Sopn Commonly known as:  LANTUS Inject 0.25 mLs (25 Units total) into the skin at bedtime.   lisinopril 2.5 MG tablet Commonly known as:  PRINIVIL,ZESTRIL Take 2.5 mg by mouth daily.   magnesium oxide 400 (241.3 Mg) MG tablet Commonly known as:  MAG-OX Take 400 mg by mouth 2 (two) times daily.   Melatonin 3 MG Tabs Take 1 tablet by mouth at  bedtime.   metoprolol tartrate 25 MG tablet Commonly known as:  LOPRESSOR Take 25 mg by mouth 2 (two) times daily.   oxyCODONE-acetaminophen 7.5-325 MG tablet Commonly known as:  PERCOCET Take 1 tablet by mouth every 12 (twelve) hours as needed for severe pain. Every 4-6 hours   tiotropium 18 MCG inhalation capsule Commonly known as:  SPIRIVA Place 18 mcg into inhaler and inhale daily.   traZODone 100 MG tablet Commonly known as:  DESYREL Take 100 mg by mouth at bedtime as needed for sleep.   TRINTELLIX 10 MG Tabs Generic drug:  vortioxetine HBr Take 1 tablet by mouth daily.   zolpidem 10 MG tablet Commonly known as:  AMBIEN Take 10 mg by mouth at bedtime as needed for sleep.       SignedChilton Greathouse 04/25/2016, 12:37 PM

## 2017-07-31 IMAGING — CR DG CHEST 2V
1 series · 2 of 2 positions shown · non-contrast
Comparison: 09/12/2014

CLINICAL DATA: Cough, green sputum for 1 week, shortness of Breath
cough

EXAM:
CHEST  2 VIEW

[Series 1: dg chest 2 view · 0.14mm/px · 2 of 2 slices shown]
[im 1/2]
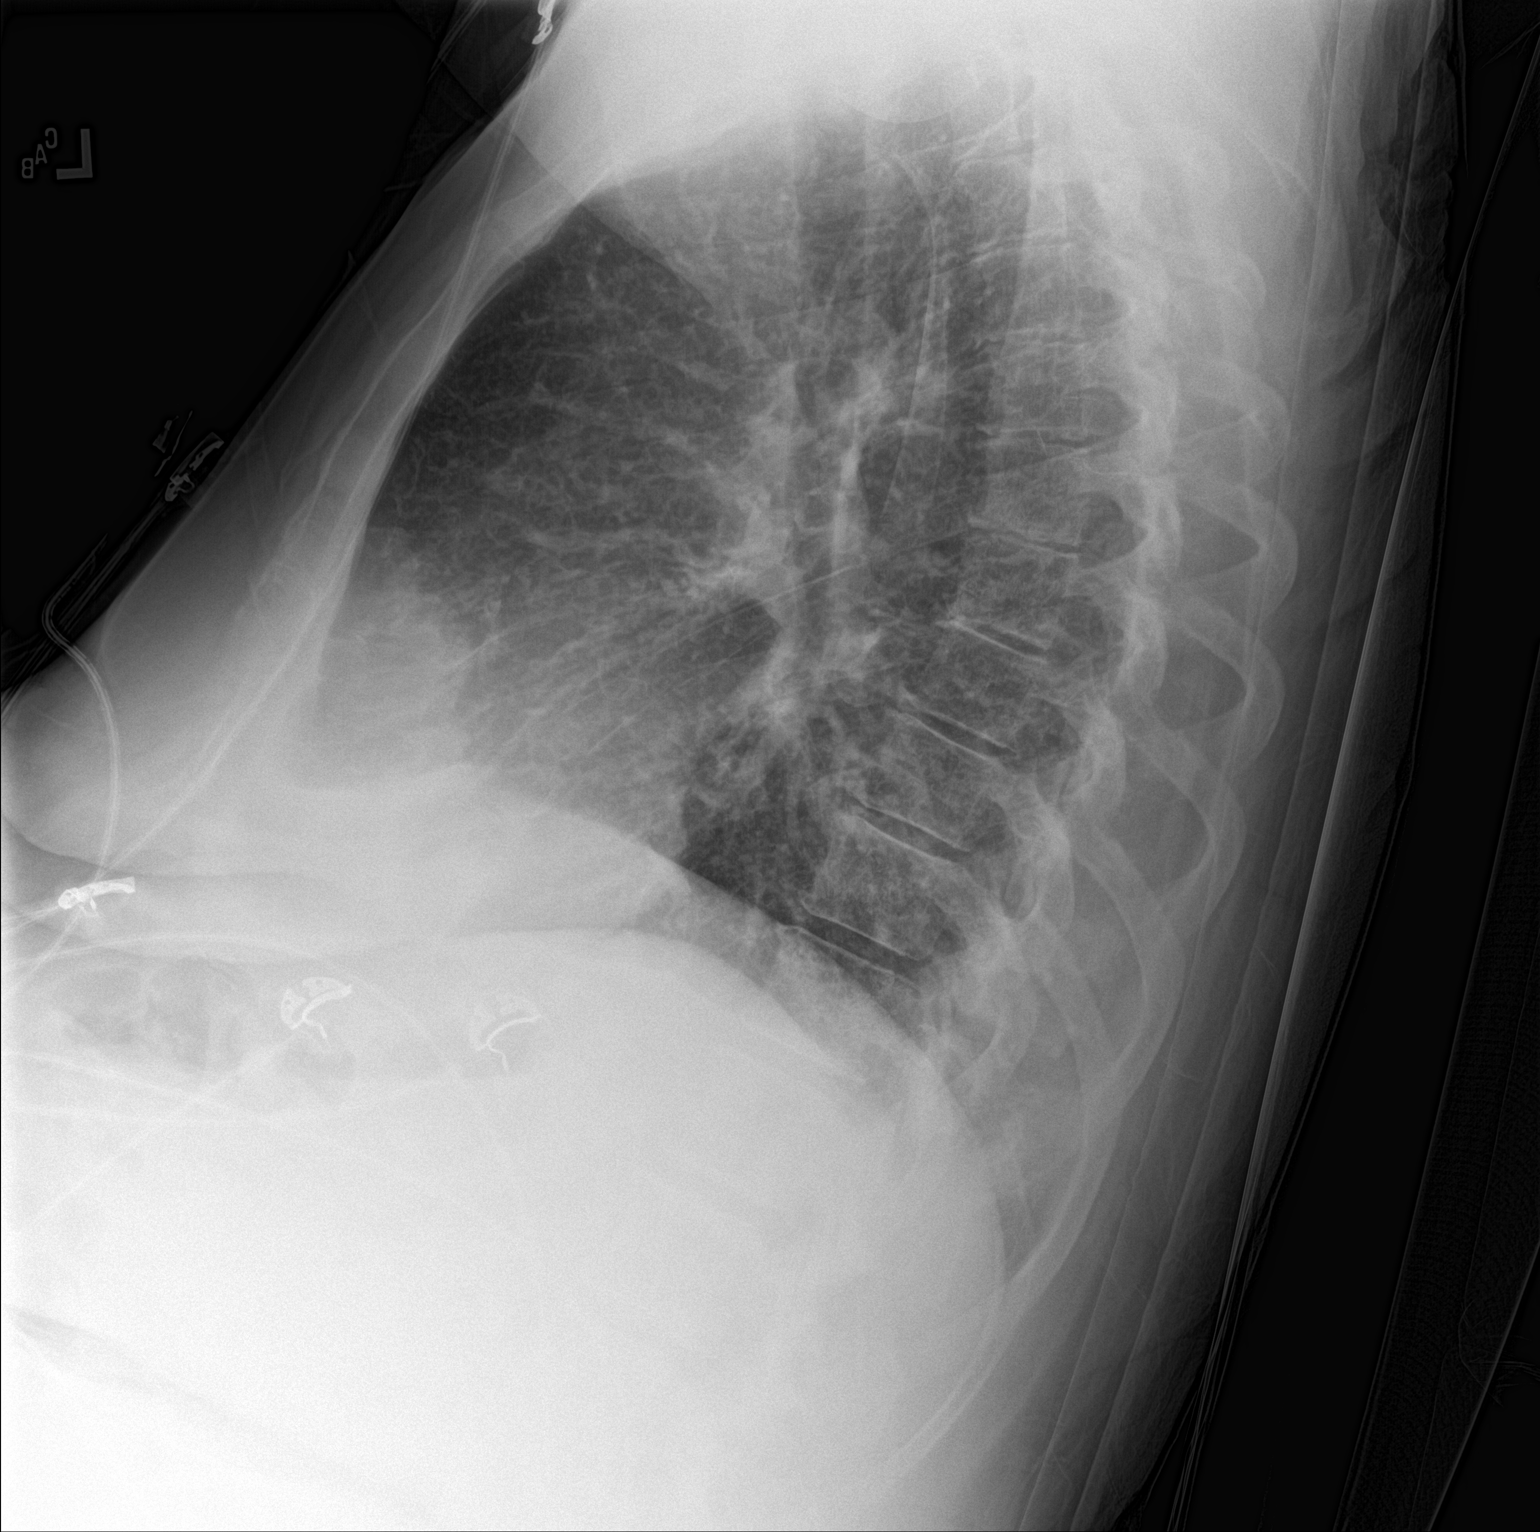
[im 2/2]
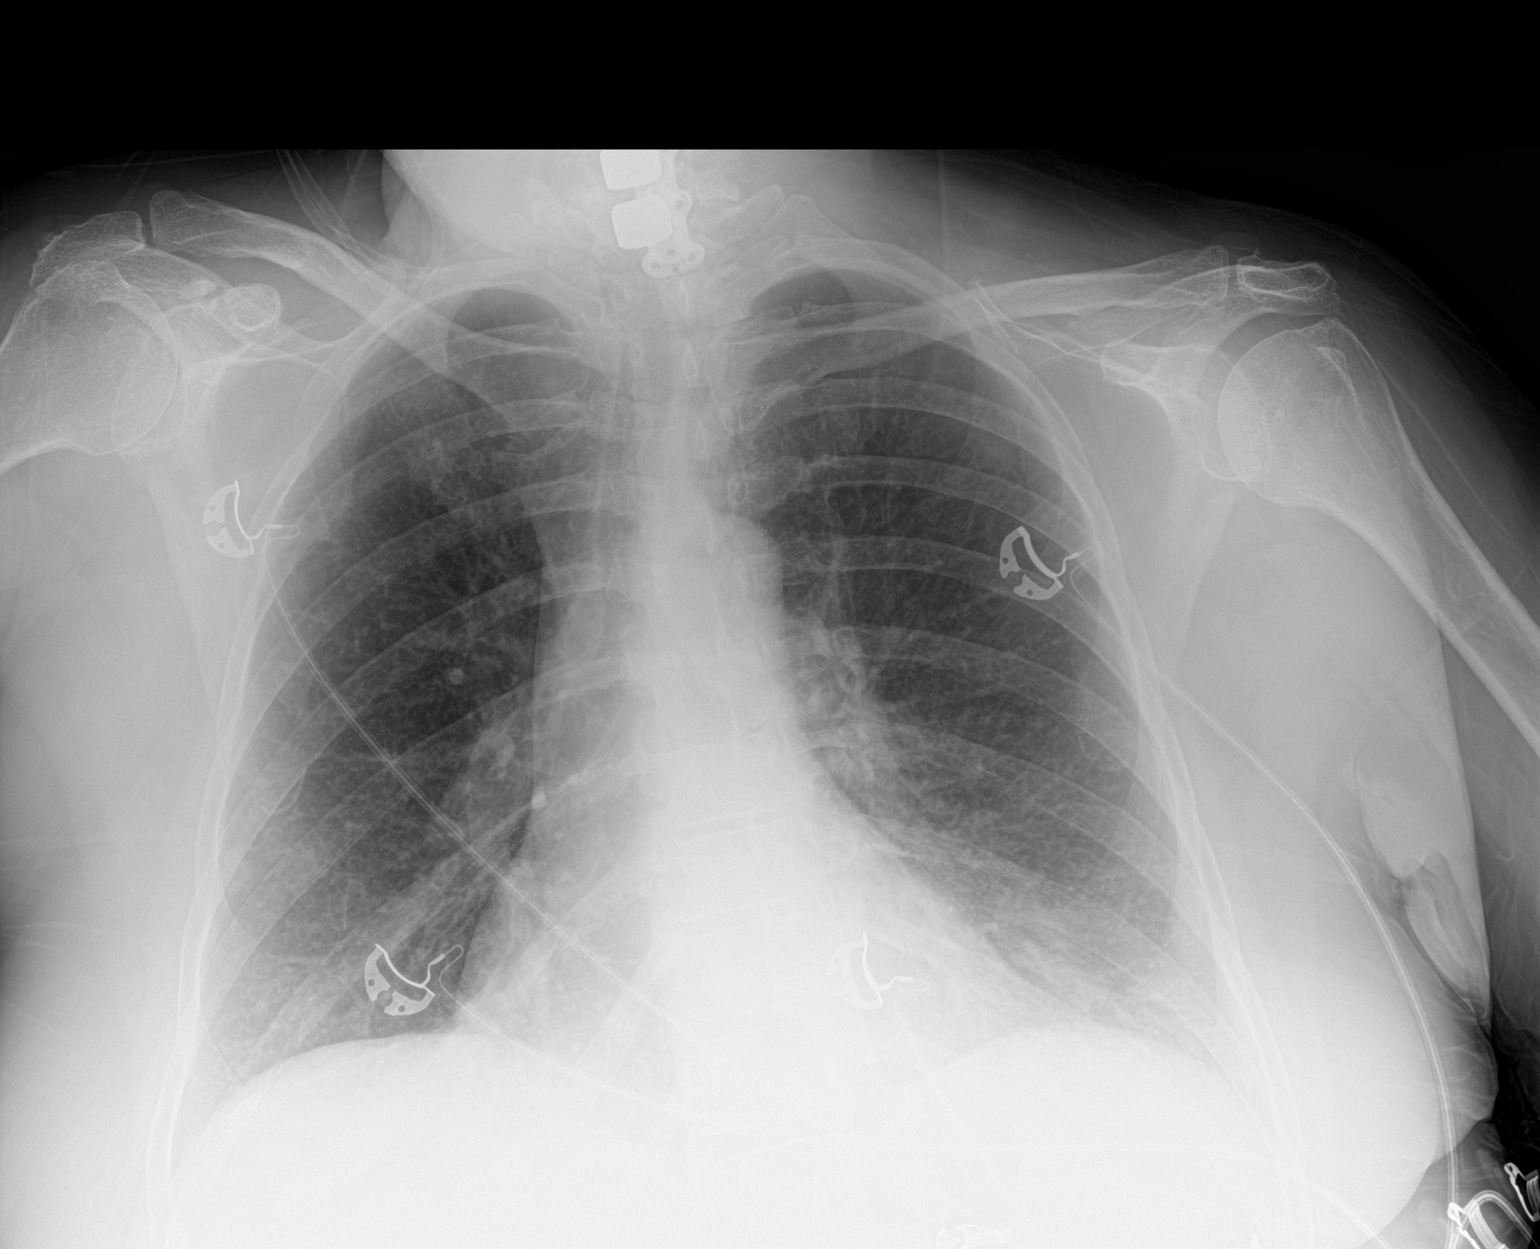

[2 of 2 positions shown; findings below may reference images not displayed]

FINDINGS: Cardiomediastinal silhouette is stable. Metallic fixation plate
cervical spine again noted. No acute infiltrate or pleural effusion.
No pulmonary edema. Mild degenerative changes mid and lower thoracic
spine.
IMPRESSION: No active cardiopulmonary disease.

## 2017-08-01 IMAGING — DX DG CHEST 1V PORT
1 series · 1 of 1 positions shown · non-contrast
Comparison: Yesterday

CLINICAL DATA: Central line placement

EXAM:
PORTABLE CHEST 1 VIEW

[chest ap]
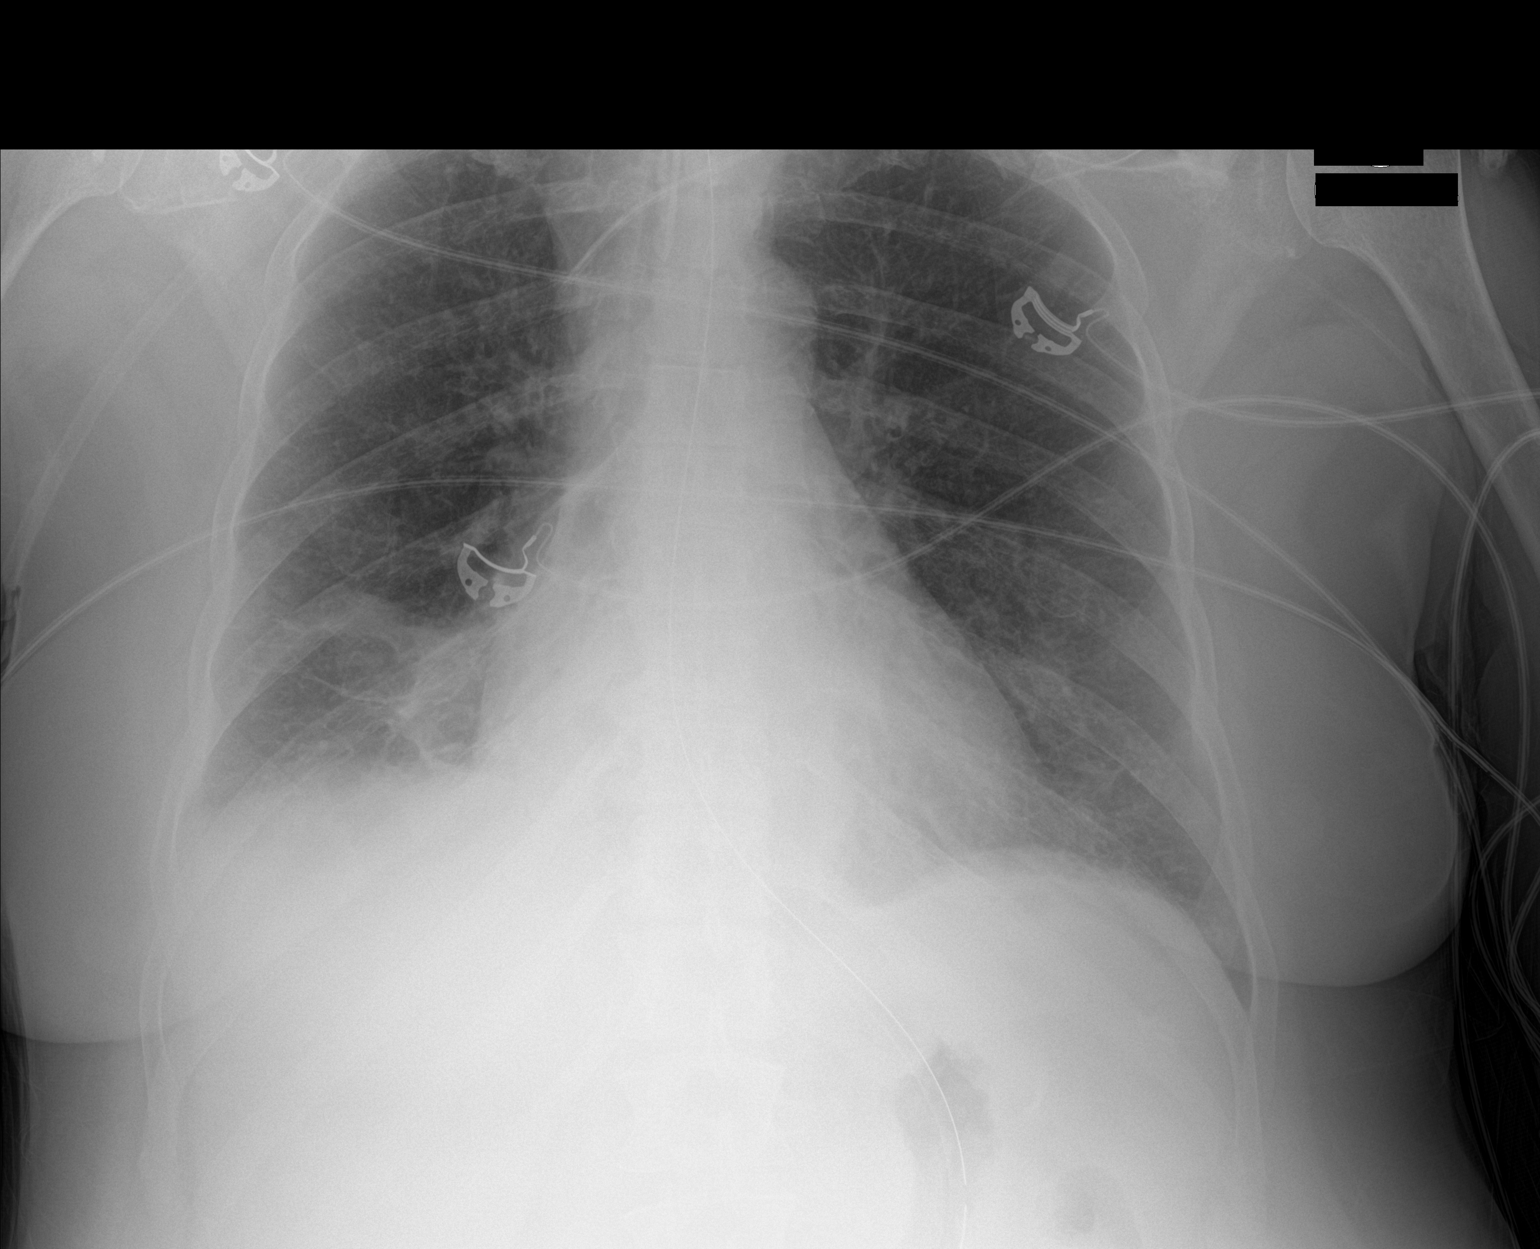

[1 of 1 positions shown; findings below may reference images not displayed]

FINDINGS: Left subclavian central line with tip at the upper SVC. No evidence
of pneumothorax.

Endotracheal tube tip between the clavicular heads and carina. An
orogastric tube reaches the stomach.

New bandlike opacity at the right base is likely atelectasis. There
is associated volume loss. Normal heart size and mediastinal
contours.
IMPRESSION: 1. New endotracheal and orogastric tubes in unremarkable position.
2. Left subclavian central line with tip at the upper SVC. No
evidence of pneumothorax.
3. New right basilar atelectasis.

## 2017-08-01 IMAGING — DX DG ABDOMEN 1V
1 series · 1 of 1 positions shown · non-contrast
Comparison: None.

CLINICAL DATA: Orogastric tube placement.

EXAM:
ABDOMEN - 1 VIEW

[abdomen kub]
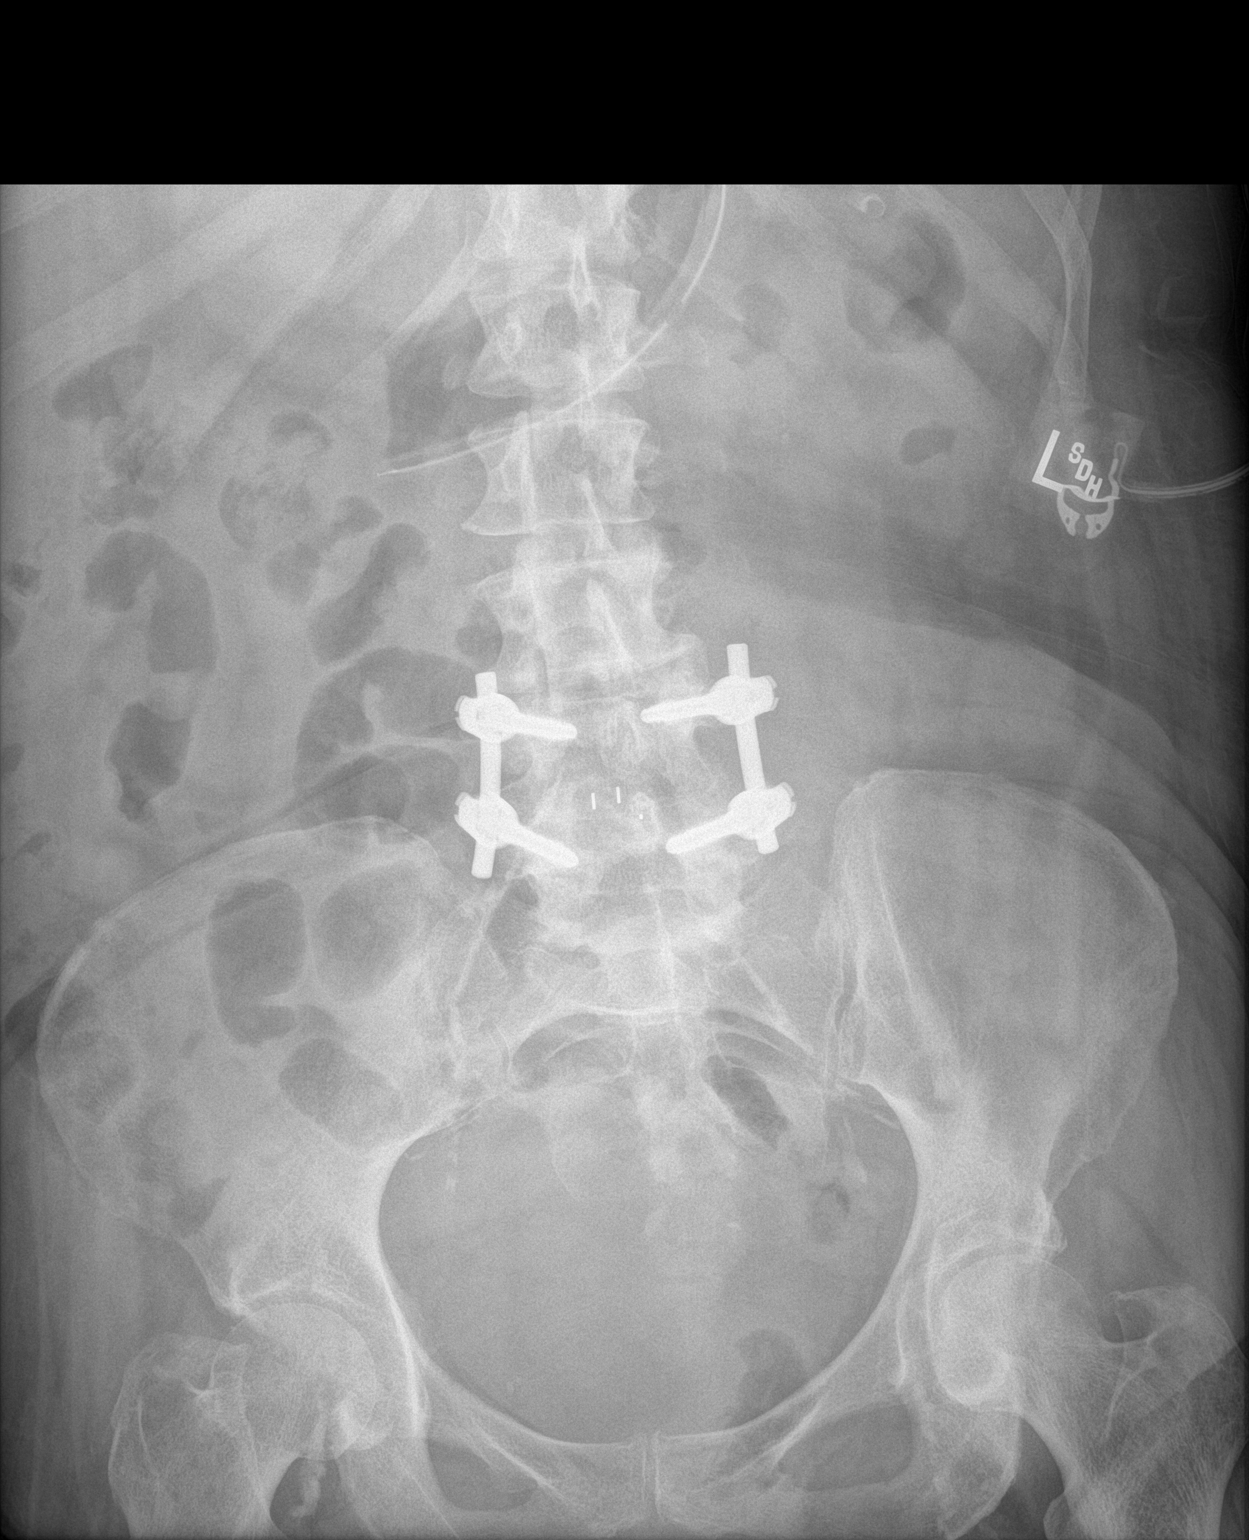

[1 of 1 positions shown; findings below may reference images not displayed]

FINDINGS: The bowel gas pattern is normal. No radio-opaque calculi or other
significant radiographic abnormality are seen. Distal tip of
orogastric tube is seen in expected position of distal stomach.
Status post surgical posterior fusion of lower lumbar spine.
IMPRESSION: Distal tip of orogastric tube is seen in expected position of distal
stomach. No evidence of bowel obstruction or ileus is noted.

## 2017-08-02 IMAGING — DX DG CHEST 1V PORT
1 series · 1 of 1 positions shown · non-contrast
Comparison: Chest x-rays dated 07/18/2015 and 07/17/2015.

CLINICAL DATA: Respiratory failure

EXAM:
PORTABLE CHEST 1 VIEW

[chest ap]
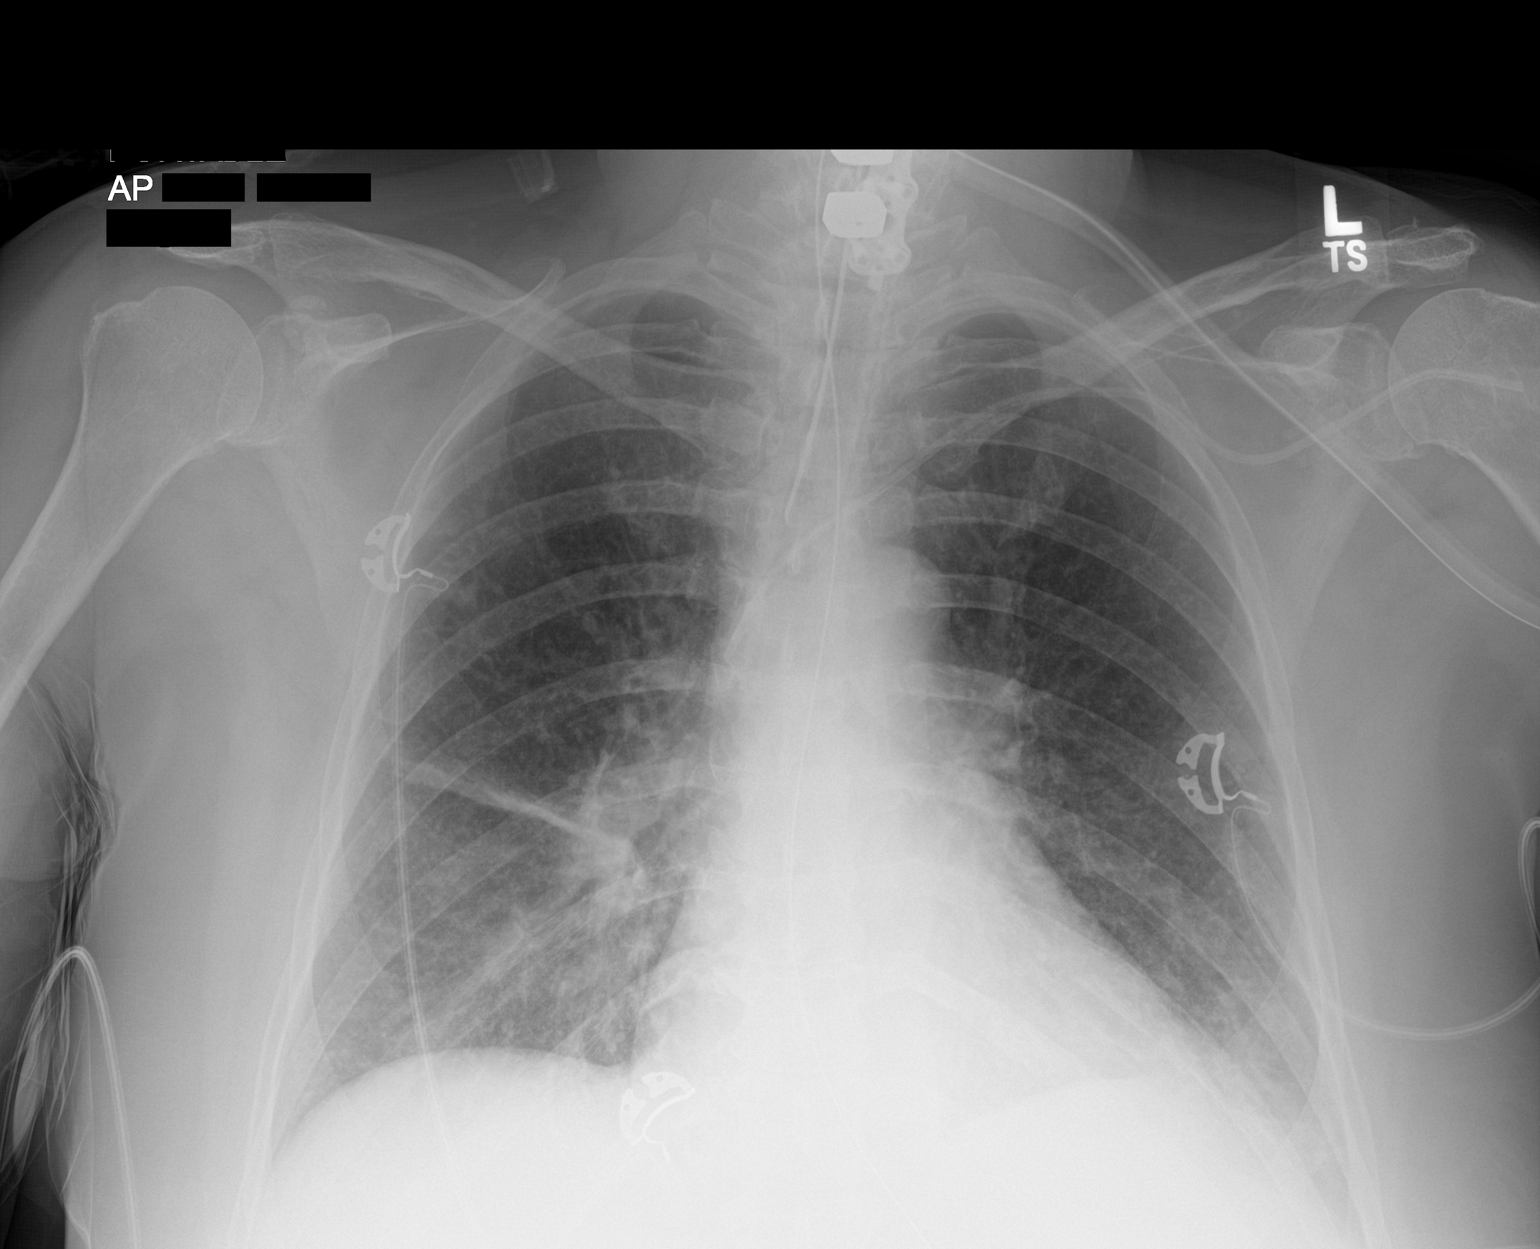

[1 of 1 positions shown; findings below may reference images not displayed]

FINDINGS: Endotracheal tube remains well positioned with tip just above the
level of the carina. Left subclavian central line is stable in
position with tip at the level of the upper SVC. Enteric tube passes
below the diaphragm. No pneumothorax.

New platelike atelectasis is seen within the right perihilar lung.
Suspect additional mild atelectasis at each lung base. There is
central pulmonary vascular congestion without overt pulmonary edema.
IMPRESSION: 1. New platelike atelectasis within the right perihilar lung.
Probable mild atelectasis at each lung base.
2. Mild central pulmonary vascular congestion without overt alveolar
pulmonary edema.
3. Tubes and lines are stable in position.
